# Patient Record
Sex: Female | Born: 1950 | Race: Black or African American | Hispanic: No | Marital: Married | Smoking: Never smoker
Health system: Southern US, Community
[De-identification: ages and names within clinical notes are randomized; demographics above are authoritative.]

## PROBLEM LIST (undated history)

## (undated) DIAGNOSIS — I739 Peripheral vascular disease, unspecified: Secondary | ICD-10-CM

## (undated) DIAGNOSIS — E786 Lipoprotein deficiency: Secondary | ICD-10-CM

## (undated) DIAGNOSIS — T7840XA Allergy, unspecified, initial encounter: Secondary | ICD-10-CM

## (undated) DIAGNOSIS — C801 Malignant (primary) neoplasm, unspecified: Secondary | ICD-10-CM

## (undated) DIAGNOSIS — Z789 Other specified health status: Secondary | ICD-10-CM

## (undated) DIAGNOSIS — Z923 Personal history of irradiation: Secondary | ICD-10-CM

## (undated) HISTORY — DX: Malignant (primary) neoplasm, unspecified: C80.1

## (undated) HISTORY — DX: Lipoprotein deficiency: E78.6

## (undated) HISTORY — DX: Allergy, unspecified, initial encounter: T78.40XA

---

## 1983-12-01 HISTORY — PX: FEMUR FRACTURE SURGERY: SHX633

## 1984-11-30 HISTORY — PX: OTHER SURGICAL HISTORY: SHX169

## 1998-03-28 ENCOUNTER — Other Ambulatory Visit: Admission: RE | Admit: 1998-03-28 | Discharge: 1998-03-28 | Payer: Self-pay | Admitting: Obstetrics and Gynecology

## 1999-04-01 ENCOUNTER — Other Ambulatory Visit: Admission: RE | Admit: 1999-04-01 | Discharge: 1999-04-01 | Payer: Self-pay | Admitting: Obstetrics and Gynecology

## 2000-02-11 ENCOUNTER — Emergency Department (HOSPITAL_COMMUNITY): Admission: EM | Admit: 2000-02-11 | Discharge: 2000-02-11 | Payer: Self-pay | Admitting: Emergency Medicine

## 2000-04-01 ENCOUNTER — Other Ambulatory Visit: Admission: RE | Admit: 2000-04-01 | Discharge: 2000-04-01 | Payer: Self-pay | Admitting: Obstetrics and Gynecology

## 2001-04-05 ENCOUNTER — Other Ambulatory Visit: Admission: RE | Admit: 2001-04-05 | Discharge: 2001-04-05 | Payer: Self-pay | Admitting: Obstetrics and Gynecology

## 2002-04-06 ENCOUNTER — Other Ambulatory Visit: Admission: RE | Admit: 2002-04-06 | Discharge: 2002-04-06 | Payer: Self-pay | Admitting: Obstetrics and Gynecology

## 2002-10-05 ENCOUNTER — Encounter: Payer: Self-pay | Admitting: Obstetrics and Gynecology

## 2002-10-05 ENCOUNTER — Encounter: Admission: RE | Admit: 2002-10-05 | Discharge: 2002-10-05 | Payer: Self-pay | Admitting: Obstetrics and Gynecology

## 2013-08-23 ENCOUNTER — Other Ambulatory Visit: Payer: Self-pay | Admitting: Gastroenterology

## 2013-08-25 ENCOUNTER — Encounter (HOSPITAL_COMMUNITY): Payer: Self-pay | Admitting: *Deleted

## 2013-08-28 ENCOUNTER — Encounter (HOSPITAL_COMMUNITY): Payer: Self-pay | Admitting: Pharmacy Technician

## 2013-09-19 ENCOUNTER — Ambulatory Visit (HOSPITAL_COMMUNITY): Payer: BC Managed Care – PPO | Admitting: *Deleted

## 2013-09-19 ENCOUNTER — Ambulatory Visit (HOSPITAL_COMMUNITY)
Admission: RE | Admit: 2013-09-19 | Discharge: 2013-09-19 | Disposition: A | Discharge status: Home / Self Care | Payer: BC Managed Care – PPO | Source: Ambulatory Visit | Attending: Gastroenterology | Admitting: Gastroenterology

## 2013-09-19 ENCOUNTER — Encounter (HOSPITAL_COMMUNITY)
Admission: RE | Disposition: A | Discharge status: Home / Self Care | Payer: Self-pay | Source: Ambulatory Visit | Attending: Gastroenterology

## 2013-09-19 ENCOUNTER — Encounter (HOSPITAL_COMMUNITY): Payer: BC Managed Care – PPO | Admitting: *Deleted

## 2013-09-19 ENCOUNTER — Encounter (HOSPITAL_COMMUNITY): Payer: Self-pay | Admitting: *Deleted

## 2013-09-19 DIAGNOSIS — Z860100 Personal history of colon polyps, unspecified: Secondary | ICD-10-CM | POA: Insufficient documentation

## 2013-09-19 DIAGNOSIS — C20 Malignant neoplasm of rectum: Secondary | ICD-10-CM | POA: Insufficient documentation

## 2013-09-19 DIAGNOSIS — C801 Malignant (primary) neoplasm, unspecified: Secondary | ICD-10-CM

## 2013-09-19 DIAGNOSIS — I10 Essential (primary) hypertension: Secondary | ICD-10-CM | POA: Insufficient documentation

## 2013-09-19 DIAGNOSIS — Z1211 Encounter for screening for malignant neoplasm of colon: Secondary | ICD-10-CM | POA: Insufficient documentation

## 2013-09-19 DIAGNOSIS — K5730 Diverticulosis of large intestine without perforation or abscess without bleeding: Secondary | ICD-10-CM | POA: Insufficient documentation

## 2013-09-19 HISTORY — PX: COLONOSCOPY WITH PROPOFOL: SHX5780

## 2013-09-19 HISTORY — DX: Other specified health status: Z78.9

## 2013-09-19 HISTORY — DX: Malignant (primary) neoplasm, unspecified: C80.1

## 2013-09-19 SURGERY — COLONOSCOPY WITH PROPOFOL
Anesthesia: Monitor Anesthesia Care

## 2013-09-19 MED ORDER — PROPOFOL INFUSION 10 MG/ML OPTIME
INTRAVENOUS | Status: DC | PRN
  Administered 2013-09-19: 100 ug/kg/min via INTRAVENOUS

## 2013-09-19 MED ORDER — ONDANSETRON HCL 4 MG/2ML IJ SOLN
INTRAMUSCULAR | Status: DC | PRN
  Administered 2013-09-19: 4 mg via INTRAVENOUS

## 2013-09-19 MED ORDER — LACTATED RINGERS IV SOLN
INTRAVENOUS | Status: DC
  Administered 2013-09-19 (×2): via INTRAVENOUS

## 2013-09-19 MED ORDER — PROPOFOL 10 MG/ML IV BOLUS
INTRAVENOUS | Status: DC | PRN
  Administered 2013-09-19 (×2): 30 mg via INTRAVENOUS

## 2013-09-19 MED ORDER — SODIUM CHLORIDE 0.9 % IV SOLN: INTRAVENOUS | Status: DC

## 2013-09-19 MED ORDER — KETAMINE HCL 10 MG/ML IJ SOLN
INTRAMUSCULAR | Status: DC | PRN
  Administered 2013-09-19: 25 mg via INTRAVENOUS

## 2013-09-19 MED ORDER — MIDAZOLAM HCL 5 MG/5ML IJ SOLN
INTRAMUSCULAR | Status: DC | PRN
  Administered 2013-09-19 (×2): 1 mg via INTRAVENOUS

## 2013-09-19 SURGICAL SUPPLY — 21 items

## 2013-09-19 NOTE — Anesthesia Preprocedure Evaluation (Signed)
Anesthesia Evaluation  Patient identified by MRN, date of birth, ID band Patient awake    Reviewed: Allergy & Precautions, H&P , NPO status , Patient's Chart, lab work & pertinent test results  Airway Mallampati: II TM Distance: >3 FB Neck ROM: Full    Dental no notable dental hx.    Pulmonary neg pulmonary ROS,  breath sounds clear to auscultation  Pulmonary exam normal       Cardiovascular negative cardio ROS  Rhythm:Regular Rate:Normal     Neuro/Psych negative neurological ROS  negative psych ROS   GI/Hepatic negative GI ROS, Neg liver ROS,   Endo/Other  negative endocrine ROS  Renal/GU negative Renal ROS  negative genitourinary   Musculoskeletal negative musculoskeletal ROS (+)   Abdominal   Peds negative pediatric ROS (+)  Hematology negative hematology ROS (+)   Anesthesia Other Findings   Reproductive/Obstetrics negative OB ROS                           Anesthesia Physical Anesthesia Plan  ASA: II  Anesthesia Plan: MAC   Post-op Pain Management:    Induction:   Airway Management Planned: Simple Face Mask  Additional Equipment:   Intra-op Plan:   Post-operative Plan:   Informed Consent: I have reviewed the patients History and Physical, chart, labs and discussed the procedure including the risks, benefits and alternatives for the proposed anesthesia with the patient or authorized representative who has indicated his/her understanding and acceptance.   Dental advisory given  Plan Discussed with: CRNA  Anesthesia Plan Comments:         Anesthesia Quick Evaluation

## 2013-09-19 NOTE — Preoperative (Signed)
Beta Blockers   Reason not to administer Beta Blockers:Not Applicable, not on home BB 

## 2013-09-19 NOTE — Op Note (Signed)
Procedure: Surveillance colonoscopy  Endoscopist: Danise Edge  Premedication: Propofol administered by anesthesia  Procedure: The patient was placed in the left lateral decubitus position. Anal inspection and digital rectal exam were normal. The Pentax pediatric colonoscope was introduced into the rectum and easily advanced to the cecum. A normal-appearing appendiceal orifice and ileocecal valve were identified. Colonic preparation for the exam today was good.  Rectum. At approximately 10 cm from the anal verge, there was a circumferential partially obstructing ulcerated tumor consistent with a primary rectal adenocarcinoma which was biopsied. Retroflexed view of the distal rectum was normal.  Sigmoid colon and descending colon. Left colonic diverticulosis  Splenic flexure. Normal.  Transverse colon. Normal.  Hepatic flexure. Normal.  Ascending colon. Normal.  Cecum and ileocecal valve. Normal.  Assessment:  #1. Circumferential ulcerated tumor in the rectum at 10 cm from the anal verge consistent with an adenocarcinoma  #2. Left colonic diverticulosis  Recommendations: Referred to surgery.

## 2013-09-19 NOTE — Transfer of Care (Signed)
Immediate Anesthesia Transfer of Care Note  Patient: Victoria [name]  Procedure(s) Performed: Procedure(s): COLONOSCOPY WITH PROPOFOL (N/A)  Patient Location: PACU  Anesthesia Type:MAC  Level of Consciousness: Patient easily awoken, sedated, comfortable, cooperative, following commands, responds to stimulation.   Airway & Oxygen Therapy: Patient spontaneously breathing, ventilating well, oxygen via simple oxygen mask.  Post-op Assessment: Report given to PACU RN, vital signs reviewed and stable, moving all extremities.   Post vital signs: Reviewed and stable.  Complications: No apparent anesthesia complications

## 2013-09-19 NOTE — H&P (Signed)
  Procedure: Surveillance colonoscopy  History: The patient is a 62 year old female born 13-Jul-1951. The patient has undergone a colonoscopic exam in the past with removal of adenomatous colon polyps.  The patient is scheduled to undergo a surveillance colonoscopy today.  Past medical history: Hypertension. Seasonal allergic rhinitis. Left femur fracture surgery in 1985.  Chronic medications: Nasonex. Allegra.  Allergies: Burn ointment caused rash  Exam: The patient is alert and lying comfortably on the endoscopy stretcher. Abdomen is soft and nontender to palpation. Cardiac exam reveals a regular rhythm. Lungs are clear to auscultation.  Plan: Proceed with surveillance colonoscopy.

## 2013-09-19 NOTE — Anesthesia Postprocedure Evaluation (Signed)
  Anesthesia Post-op Note  Patient: Victoria [name]  Procedure(s) Performed: Procedure(s) (LRB): COLONOSCOPY WITH PROPOFOL (N/A)  Patient Location: PACU  Anesthesia Type: MAC  Level of Consciousness: awake and alert   Airway and Oxygen Therapy: Patient Spontanous Breathing  Post-op Pain: mild  Post-op Assessment: Post-op Vital signs reviewed, Patient's Cardiovascular Status Stable, Respiratory Function Stable, Patent Airway and No signs of Nausea or vomiting  Last Vitals:  Filed Vitals:   09/19/13 0910  BP: 147/84  Temp:   Resp: 22    Post-op Vital Signs: stable   Complications: No apparent anesthesia complications

## 2013-09-20 ENCOUNTER — Encounter (HOSPITAL_COMMUNITY): Payer: Self-pay | Admitting: Gastroenterology

## 2013-09-22 ENCOUNTER — Encounter (INDEPENDENT_AMBULATORY_CARE_PROVIDER_SITE_OTHER): Payer: Self-pay

## 2013-09-26 ENCOUNTER — Other Ambulatory Visit: Payer: Self-pay | Admitting: Gastroenterology

## 2013-09-27 ENCOUNTER — Encounter (HOSPITAL_COMMUNITY)
Admission: RE | Disposition: A | Discharge status: Home / Self Care | Payer: Self-pay | Source: Ambulatory Visit | Attending: Gastroenterology

## 2013-09-27 ENCOUNTER — Ambulatory Visit (HOSPITAL_COMMUNITY)
Admission: RE | Admit: 2013-09-27 | Discharge: 2013-09-27 | Disposition: A | Discharge status: Home / Self Care | Payer: BC Managed Care – PPO | Source: Ambulatory Visit | Attending: Gastroenterology | Admitting: Gastroenterology

## 2013-09-27 ENCOUNTER — Encounter (INDEPENDENT_AMBULATORY_CARE_PROVIDER_SITE_OTHER): Payer: Self-pay | Admitting: General Surgery

## 2013-09-27 ENCOUNTER — Other Ambulatory Visit (INDEPENDENT_AMBULATORY_CARE_PROVIDER_SITE_OTHER): Payer: Self-pay | Admitting: General Surgery

## 2013-09-27 ENCOUNTER — Ambulatory Visit (INDEPENDENT_AMBULATORY_CARE_PROVIDER_SITE_OTHER): Payer: BC Managed Care – PPO | Admitting: General Surgery

## 2013-09-27 ENCOUNTER — Telehealth (INDEPENDENT_AMBULATORY_CARE_PROVIDER_SITE_OTHER): Payer: Self-pay | Admitting: *Deleted

## 2013-09-27 ENCOUNTER — Ambulatory Visit (INDEPENDENT_AMBULATORY_CARE_PROVIDER_SITE_OTHER): Payer: Self-pay | Admitting: General Surgery

## 2013-09-27 ENCOUNTER — Encounter (HOSPITAL_COMMUNITY): Payer: Self-pay | Admitting: *Deleted

## 2013-09-27 VITALS — BP 128/82 | HR 72 | Temp 97.5°F | Resp 16 | Ht 65.0 in | Wt 174.0 lb

## 2013-09-27 DIAGNOSIS — Z860100 Personal history of colon polyps, unspecified: Secondary | ICD-10-CM | POA: Insufficient documentation

## 2013-09-27 DIAGNOSIS — C20 Malignant neoplasm of rectum: Secondary | ICD-10-CM

## 2013-09-27 DIAGNOSIS — I10 Essential (primary) hypertension: Secondary | ICD-10-CM | POA: Insufficient documentation

## 2013-09-27 HISTORY — PX: EUS: SHX5427

## 2013-09-27 SURGERY — ULTRASOUND, LOWER GI TRACT, ENDOSCOPIC
Anesthesia: Moderate Sedation

## 2013-09-27 MED ORDER — MIDAZOLAM HCL 10 MG/2ML IJ SOLN
INTRAMUSCULAR | Status: DC | PRN
  Administered 2013-09-27: 1 mg via INTRAVENOUS
  Administered 2013-09-27 (×2): 2 mg via INTRAVENOUS

## 2013-09-27 MED ORDER — MIDAZOLAM HCL 10 MG/2ML IJ SOLN
INTRAMUSCULAR | Status: AC
  Filled 2013-09-27: qty 2

## 2013-09-27 MED ORDER — FENTANYL CITRATE 0.05 MG/ML IJ SOLN
INTRAMUSCULAR | Status: AC
  Filled 2013-09-27: qty 2

## 2013-09-27 MED ORDER — DIPHENHYDRAMINE HCL 50 MG/ML IJ SOLN
INTRAMUSCULAR | Status: AC
  Filled 2013-09-27: qty 1

## 2013-09-27 MED ORDER — FENTANYL CITRATE 0.05 MG/ML IJ SOLN
INTRAMUSCULAR | Status: DC | PRN
  Administered 2013-09-27 (×2): 25 ug via INTRAVENOUS

## 2013-09-27 NOTE — Patient Instructions (Signed)
Continue your usual diet and activities.

## 2013-09-27 NOTE — Progress Notes (Addendum)
Patient ID: Victoria [name], female   DOB: 1951-06-10, 62 y.o.   MRN: [id number]  No chief complaint on file.   HPI Victoria [name] is a 62 y.o. female.   HPI  She is referred by Dr. Danise Edge for newly diagnosed rectal cancer. She underwent a routine colonoscopy and was noted to have a mass approximately 10 cm from the anal verge. Biopsy was positive for adenocarcinoma. In questioning her, her appetite has decreased over the past 3 months and she is lost approximately 30 pounds. She has noticed decreased caliber of her stool. She has no anal or rectal pain. No rectal bleeding. She has no abdominal pain. Her energy level is normal. She underwent endorectal ultrasound by Dr. Dulce Sellar today. This demonstrated a T3 N1 lesion by ultrasound criteria.  No family history of colon cancer.  Last colonoscopy was 4-5 years ago at which time some polyps were removed.  Past Medical History  Diagnosis Date  . Medical history non-contributory   . HDL deficiency   . Allergy     seasonal allergic rhinitis    Past Surgical History  Procedure Laterality Date  . Left leg surgery  1986    rod from hip to knee  . Colonoscopy with propofol N/A 09/19/2013    Procedure: COLONOSCOPY WITH PROPOFOL;  Surgeon: Charolett Bumpers, MD;  Location: [facility];  Service: Endoscopy;  Laterality: N/A;  . Femur fracture surgery Left 1985    steel rod    Family History  Problem Relation Age of Onset  . Hypertension Mother   . Diabetes Father   . Heart disease Father     CAD    Social History History  Substance Use Topics  . Smoking status: Never Smoker   . Smokeless tobacco: Never Used  . Alcohol Use: Yes     Comment: occasional    Allergies  Allergen Reactions  . Ointment Base [Lanolin-Petrolatum] Rash    Specifically burn ointments.    No current outpatient prescriptions on file.   No current facility-administered medications for this visit.    Review of Systems Review of Systems   Constitutional: Unexpected weight change: 30 pound weight loss.  HENT: Negative.   Respiratory: Negative.   Cardiovascular: Negative.   Gastrointestinal: Positive for constipation (Occasional). Negative for nausea, abdominal pain and rectal pain.  Genitourinary: Negative.   Neurological: Negative.   Hematological: Negative.     Blood pressure 128/82, pulse 72, temperature 97.5 F (36.4 C), temperature source Temporal, resp. rate 16, height 5\' 5"  (1.651 m), weight 174 lb (78.926 kg).  Physical Exam Physical Exam  Constitutional: No distress.  Overweight female  HENT:  Head: Normocephalic and atraumatic.  Eyes: EOM are normal. No scleral icterus.  Neck: Neck supple.  Cardiovascular: Normal rate and regular rhythm.   Pulmonary/Chest: Effort normal and breath sounds normal.  No supraclavicular adenopathy.  Abdominal: Soft. She exhibits no distension and no mass. There is no tenderness.  Genitourinary:  On digital rectal exam, there is a palpable mass 7.5 cm from the anal verge.  There is a prominent left inguinal lymph node.  Musculoskeletal: She exhibits no edema.  Lymphadenopathy:    She has no cervical adenopathy.  Neurological: She is alert.  Skin: Skin is warm and dry.  Psychiatric: She has a normal mood and affect. Her behavior is normal.    Data Reviewed Colonoscopy report. Endorectal ultrasound pictures report. Pathology report. Note from Dr. Laural Benes.  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 Ultrasound T3, N1 lesion.   She has a recent 30 pound weight loss. She is otherwise asymptomatic from this.     Plan    Staging CT of chest, abdomen and pelvis. CEA level. Referral to medical oncology and radiation oncology for neoadjuvant chemotherapy radiation therapy.   I explained to her and her husband operative management this which could be low anterior resection and protective loop ileostomy following the treatments. We discussed  getting the staging CTs as soon as possible as well as a CEA level. We'll discuss the results with her when I receive them.       Dell Hurtubise J 09/27/2013, 1:06 PM

## 2013-09-27 NOTE — Addendum Note (Signed)
Addended by: Willis Modena on: 09/27/2013 07:58 AM   Modules accepted: Orders

## 2013-09-27 NOTE — H&P (View-Only) (Signed)
  Procedure: Surveillance colonoscopy  History: The patient is a 62-year-old female born 06/15/1951. The patient has undergone a colonoscopic exam in the past with removal of adenomatous colon polyps.  The patient is scheduled to undergo a surveillance colonoscopy today.  Past medical history: Hypertension. Seasonal allergic rhinitis. Left femur fracture surgery in 1985.  Chronic medications: Nasonex. Allegra.  Allergies: Burn ointment caused rash  Exam: The patient is alert and lying comfortably on the endoscopy stretcher. Abdomen is soft and nontender to palpation. Cardiac exam reveals a regular rhythm. Lungs are clear to auscultation.  Plan: Proceed with surveillance colonoscopy. 

## 2013-09-27 NOTE — Progress Notes (Signed)
Dr. Dulce Sellar in to see pt post procedure.  Teaching done by this RN with patient and spouse ie development of POC for treatment of Ca mass. Discussed use of chemo/radiation and surg. intervention tailored specifically for her type of and stage of Ca.  Both asking appropriate questions. Pt to see Surgeon today.Marland Kitchen

## 2013-09-27 NOTE — Interval H&P Note (Signed)
History and Physical Interval Note:  09/27/2013 7:58 AM  Victoria [name]  has presented today for surgery, with the diagnosis of rectal ca  The various methods of treatment have been discussed with the patient and family. After consideration of risks, benefits and other options for treatment, the patient has consented to  Procedure(s): LOWER ENDOSCOPIC ULTRASOUND (EUS) (N/A) as a surgical intervention .  The patient's history has been reviewed, patient examined, no change in status, stable for surgery.  I have reviewed the patient's chart and labs.  Questions were answered to the patient's satisfaction.     Victoria [name] M  Assessment:  1.  Rectal Cancer.  Biopsy-proven adenocarcinoma.  Plan:  1.  Rectal ultrasound for locoregional staging.  Patient has surgical appointment later today and will need complete staging with CT or PET scan as well. 2.  Risks (bleeding, infection, bowel perforation that could require surgery, sedation-related changes in cardiopulmonary systems), benefits (identification and possible treatment of source of symptoms, exclusion of certain causes of symptoms), and alternatives (watchful waiting, radiographic imaging studies, empiric medical treatment) of endorectal ultrasound (RUS) were explained to patient/family in detail and patient wishes to proceed.

## 2013-09-27 NOTE — Op Note (Addendum)
[facility] [address] [place], [address]   OPERATIVE PROCEDURE REPORT  PATIENT: Victoria [name], Victoria [name].  MR#: [id number] BIRTHDATE: February 21, 1951  GENDER: Female ENDOSCOPIST: Willis Modena, MD REFERRED BY:  Danise Edge, M.D.; Kirby Funk, MD; Avel Peace, MD PROCEDURE DATE:  09/27/2013 PROCEDURE:   Flexible sigmoidoscopy EUS ASA CLASS:   Class II INDICATIONS:1.  rectal cancer (adenocarcinoma). MEDICATIONS:      Fentanyl 50 mcg IV; Versed 5 mg IV  DESCRIPTION OF PROCEDURE:   After the risks benefits and alternatives of the procedure were thoroughly explained, informed consent was obtained.  Throughout the procedure, the patients blood pressure, pulse and oxygen saturations were monitored continuously. Under direct visualization, the forward-viewing radial  echoendoscope was introduced through the anus  and advanced to the sigmoid colon .  Water was used as necessary to provide an acoustic interface.  Imaging was obtained at 7.5 and . Upon completion of the imaging, water was removed and the patient was sent to the recovery room in satisfactory condition.    FINDINGS:   Firm, fixed mass palpated along left posterolateral rectal wall, at end of examination digit.  Endoscopically, mass is near completely ( 75%, and confirmed by ultrasound) circumferential, but sparing more of the right anterolateral rectal wall.  Mass is located approximately 8cm to 13cm from the anal verge.  Mass is ulcerated and friable.  Water was subsequently instilled into the rectum to facilitate acoustic coupling for ultrasonographic analysis.  Mass is about 75% circumferential. Multiple regions of the tumor penetrate through the muscularis propria.  A few neighboring malignant-appearing perirectal lymph nodes were seen.  STAGING: T3 N1 Mx  ENDOSCOPIC IMPRESSION: As above.  RECOMMENDATIONS: 1.  Watch for potential complications of procedure. 2.  Surgical  appointment today. 3.  Needs CEA and staging imaging (CT versus PET scan); will defer to surgical consultant.   _______________________________ Rosalie DoctorWillis Modena, MD 09/27/2013 8:31 AM Revised: 09/27/2013 8:31 AM  CC:

## 2013-09-27 NOTE — Telephone Encounter (Signed)
I called pt to inform her of her appt for the CT scan at [facility] on 09/29/13 with an arrival time of 1:45pm.  I instructed pt on when to drink her contrast and to have no solid foods after 10am.  Pt agreeable.

## 2013-09-28 ENCOUNTER — Encounter (HOSPITAL_COMMUNITY): Payer: Self-pay | Admitting: Gastroenterology

## 2013-09-29 ENCOUNTER — Encounter: Payer: Self-pay | Admitting: *Deleted

## 2013-09-29 ENCOUNTER — Ambulatory Visit
Admit: 2013-09-29 | Discharge: 2013-09-29 | Disposition: A | Discharge status: Home / Self Care | Payer: BC Managed Care – PPO | Attending: General Surgery | Admitting: General Surgery

## 2013-09-29 DIAGNOSIS — C20 Malignant neoplasm of rectum: Secondary | ICD-10-CM

## 2013-09-29 MED ORDER — IOHEXOL 300 MG/ML  SOLN
100.0000 mL | Freq: Once | INTRAMUSCULAR | Status: AC | PRN
  Administered 2013-09-29: 100 mL via INTRAVENOUS

## 2013-09-29 NOTE — Progress Notes (Signed)
GU Location of Tumor / Histology: Rectum    Patient presented  months ago with signs/symptoms of: decreased caliber of stool, loss appetite  Past 3 months,loss 30 lbs,no rectal bleeding,  Biopsies of Rectum 09/19/13:DiagnosisRectum, biopsy, adenocarcinoma- ADENOCARCINOMA.  Past/Anticipated interventions by urology, if any: Colonoscopy 09/19/13 Dr. Robin Searing, last colonoscopy 4-5 years ago, no family hx colon cancer,polyps removed then  Past/Anticipated interventions by medical oncology, if ZOX:WRUE 10/09/13 2 pm with Dr.Sherrill,  Weight changes, if any: 30 lb wt.loss past 3 months  Bowel/Bladder complaints, if any: decreased caliber stool Nausea/Vomiting, if any: no  Pain issues, if any no :  SAFETY ISSUES:no  Prior radiation?no  Pacemaker/ICD? no  Possible current pregnancy? no  Is the patient on methotrexate? no  Current Complaints / other details:  Dr.Todd Rosenbower seen patient 10/29/14ordered  For 09/29/13 ct chest /abd/pelvis  ,CEA level referral to med/onc and radiation oncology surgery following treatments,

## 2013-10-02 ENCOUNTER — Encounter: Payer: Self-pay | Admitting: Radiation Oncology

## 2013-10-02 ENCOUNTER — Ambulatory Visit
Admit: 2013-10-02 | Discharge: 2013-10-02 | Disposition: A | Discharge status: Home / Self Care | Payer: BC Managed Care – PPO | Attending: Radiation Oncology | Admitting: Radiation Oncology

## 2013-10-02 VITALS — BP 153/68 | HR 67 | Temp 98.1°F | Resp 20 | Ht 65.0 in | Wt 174.2 lb

## 2013-10-02 DIAGNOSIS — C20 Malignant neoplasm of rectum: Secondary | ICD-10-CM

## 2013-10-02 NOTE — Progress Notes (Signed)
    GU Location of Tumor / Histology: Rectum  Patient presented months ago with signs/symptoms of: decreased caliber of stool, loss appetite Past 3 months,loss 30 lbs,no rectal bleeding,  Biopsies of Rectum 09/19/13:DiagnosisRectum, biopsy, adenocarcinoma- ADENOCARCINOMA.  Past/Anticipated interventions by urology, if any: Colonoscopy 09/19/13 Dr. Robin Searing, last colonoscopy 4-5 years ago, no family hx colon cancer,polyps removed then  Past/Anticipated interventions by medical oncology, if YQM:VHQI 10/09/13 2 pm with Dr.Sherrill,  Weight changes, if any: 30 lb wt.loss past 3 months  Bowel/Bladder complaints, if any: decreased caliber stool  Nausea/Vomiting, if any: no  Pain issues, if any no but c/o constipation, takes metamucil prn  :  SAFETY ISSUES:no  Prior radiation?no  Pacemaker/ICD? no  Possible current pregnancy? no  Is the patient on methotrexate? no Current Complaints / other details: Married, no children  Dr.Todd Rosenbower seen patient 10/29/14ordered For 09/29/13 ct chest /abd/pelvis ,CEA level referral to med/onc and radiation oncology surgery following treatments, Has a steel rod in left leg 20 years, menopause,

## 2013-10-02 NOTE — Progress Notes (Signed)
Please see the Nurse Progress Note in the MD Initial Consult Encounter for this patient. 

## 2013-10-03 ENCOUNTER — Telehealth: Payer: Self-pay | Admitting: *Deleted

## 2013-10-03 NOTE — Telephone Encounter (Signed)
Confirmed appointment with Dr. Truett Perna on 10/09/13.

## 2013-10-04 ENCOUNTER — Ambulatory Visit
Admission: RE | Admit: 2013-10-04 | Discharge: 2013-10-04 | Disposition: A | Discharge status: Home / Self Care | Payer: BC Managed Care – PPO | Source: Ambulatory Visit | Attending: Radiation Oncology | Admitting: Radiation Oncology

## 2013-10-04 ENCOUNTER — Ambulatory Visit (INDEPENDENT_AMBULATORY_CARE_PROVIDER_SITE_OTHER): Payer: Self-pay | Admitting: General Surgery

## 2013-10-04 ENCOUNTER — Telehealth (INDEPENDENT_AMBULATORY_CARE_PROVIDER_SITE_OTHER): Payer: Self-pay

## 2013-10-04 DIAGNOSIS — R110 Nausea: Secondary | ICD-10-CM | POA: Insufficient documentation

## 2013-10-04 DIAGNOSIS — K6289 Other specified diseases of anus and rectum: Secondary | ICD-10-CM | POA: Insufficient documentation

## 2013-10-04 DIAGNOSIS — Z510 Encounter for antineoplastic radiation therapy: Secondary | ICD-10-CM | POA: Insufficient documentation

## 2013-10-04 DIAGNOSIS — R5381 Other malaise: Secondary | ICD-10-CM | POA: Insufficient documentation

## 2013-10-04 DIAGNOSIS — C20 Malignant neoplasm of rectum: Secondary | ICD-10-CM | POA: Insufficient documentation

## 2013-10-04 DIAGNOSIS — R630 Anorexia: Secondary | ICD-10-CM | POA: Insufficient documentation

## 2013-10-04 DIAGNOSIS — Z79899 Other long term (current) drug therapy: Secondary | ICD-10-CM | POA: Insufficient documentation

## 2013-10-04 NOTE — Telephone Encounter (Signed)
Patient calling into office stating she had a miss call from our office.  I did not see any telephone notes in EPIC for patient.  Patient ask for [place] to call on her mobile number if we need to speak with her [phone]).  Patient aware Dr. Abbey Chatters has left for the day and I will forward message to his assistant.

## 2013-10-04 NOTE — Progress Notes (Signed)
[facility] Psychosocial Distress Screening Clinical Social Work  Clinical Social Work was referred by distress screening protocol.  The patient scored a 5 on the Psychosocial Distress Thermometer which indicates moderate distress. Clinical Social Worker Intern telephoned to assess for distress and other psychosocial needs. Patient was not home and person that answered the phone took a message.     Clinical Social Worker follow up needed: no  If yes, follow up plan:   Victoria [name] S. [facility] Clinical Social Work Intern [employer] [facility] [phone]

## 2013-10-05 ENCOUNTER — Encounter (INDEPENDENT_AMBULATORY_CARE_PROVIDER_SITE_OTHER): Payer: Self-pay | Admitting: General Surgery

## 2013-10-05 NOTE — Progress Notes (Signed)
Patient ID: Victoria [name], female   DOB: [birth year], 62 y.o.   MRN: [id number] I spoke with her regarding the results of her CT scans, which are below, as well as a normal CEA. She has an indeterminate 7 mm nodule in the right upper lobe of her lung. I told her Dr. Truett Perna would review that and decide if anything further needs to be done.  IMPRESSION:  1. Circumferential rectal thickening over 5 cm segment consists with  primary carcinoma.  2. Small 5 mm presacral lymph node is concerning for local  metastasis.  3. No evidence of or distant iliac nodal metastasis or  retroperitoneal metastasis  4. Single right upper lobe pulmonary nodule measuring 7 mm is  indeterminate. Recommend either attention on followup versus further  evaluation with FDG PET/CT scan.

## 2013-10-05 NOTE — Progress Notes (Signed)
Radiation Oncology         [phone] ________________________________  Name: Victoria [name] MRN: [id number]  Date: 10/02/2013  DOB: 1951-01-04  WU:[id number],NWGN Jomarie Longs, MD  Abbey Chatters, Jim Desanctis, MD     G. Rolm Baptise, M.D.  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 female who is seen for an initial consultation visit. The patient underwent a colonoscopy as part of recent medical workup. The patient's husband indicates that she was having some weight loss and he discussed proceeding with a colonoscopy among other avenues with her primary care physician. This colonoscopy revealed a rectal mass and a biopsy was performed which returned positive for adenocarcinoma.  The patient has undergone a flexible sigmoidoscopy with endoscopic ultrasound. A firm fixed mass was present along the left posterior lateral rectal wall at the end of the examination digit. Endoscopically this appeared to represent a T3 N1 tumor. The mass was located from approximately 8 cm to 13 cm from the anal her.  CT imaging has been performed. A CT scan of the chest revealed a 7 mm nodule within the right lung apex without any additional pulmonary nodules. Circumferential thickening of the rectum was present beginning at approximately 3 cm from the anal verge and extending 45 cm. A small suspicious 5 mm presacral lymph node was also present.  The patient indicates that she has been doing reasonably well. She has no anal or rectal pain at this time. She did have just a little bit of rectal bleeding for approximately 3-4 months with stool. She estimates that she has lost approximately 30 pounds.   PREVIOUS RADIATION THERAPY: No   PAST MEDICAL HISTORY:  has a past medical history of Medical history non-contributory; HDL deficiency; Allergy; and Cancer (09/19/13).     PAST SURGICAL  HISTORY: Past Surgical History  Procedure Laterality Date  . Left leg surgery  1986    rod from hip to knee  . Colonoscopy with propofol N/A 09/19/2013    Procedure: COLONOSCOPY WITH PROPOFOL;  Surgeon: Charolett Bumpers, MD;  Location: [facility];  Service: Endoscopy;  Laterality: N/A;  . Femur fracture surgery Left 1985    steel rod  . Eus N/A 09/27/2013    Procedure: LOWER ENDOSCOPIC ULTRASOUND (EUS);  Surgeon: Willis Modena, MD;  Location: [facility];  Service: Endoscopy;  Laterality: N/A;     FAMILY HISTORY: family history includes Diabetes in her father; Heart disease in her father; Hypertension in her mother.   SOCIAL HISTORY:  reports that she has never smoked. She has never used smokeless tobacco. She reports that she drinks alcohol. She reports that she does not use illicit drugs.   ALLERGIES: Ointment base   MEDICATIONS:  Current Outpatient Prescriptions  Medication Sig Dispense Refill  . ASA-APAP-Caff Buffered [phone] MG TABS Take 1 tablet by mouth as needed.      . fexofenadine (ALLEGRA) 180 MG tablet Take 180 mg by mouth daily.      . mometasone (NASONEX) 50 MCG/ACT nasal spray Place 2 sprays into the nose daily.      . psyllium (METAMUCIL) 58.6 % powder Take 1 packet by mouth every other day.       No current facility-administered medications for this encounter.     REVIEW OF SYSTEMS:  A 15 point review of systems is documented in the electronic medical record. This was obtained by the  nursing staff. However, I reviewed this with the patient to discuss relevant findings and make appropriate changes.  Pertinent items are noted in HPI.    PHYSICAL EXAM:  height is 5\' 5"  (1.651 m) and weight is 174 lb 3.2 oz (79.017 kg). Her oral temperature is 98.1 F (36.7 C). Her blood pressure is 153/68 and her pulse is 67. Her respiration is 20.   General: Well-developed, in no acute distress HEENT: Normocephalic, atraumatic; oral cavity clear Neck: Supple without any  lymphadenopathy Cardiovascular: Regular rate and rhythm Respiratory: Clear to auscultation bilaterally GI: Soft, nontender, normal bowel sounds Extremities: No edema present Neuro: No focal deficits Rectal:  Rectal tumor felt at the end of examination digit. No blood on exam glove. No external suspicious findings. Tumor estimated to begin approximately 5-6 cm from the anal verge.    LABORATORY DATA:  No results found for this basename: WBC, HGB, HCT, MCV, PLT   No results found for this basename: NA, K, CL, CO2   No results found for this basename: ALT, AST, GGT, ALKPHOS, BILITOT      RADIOGRAPHY: Ct Chest W Contrast  09/29/2013   CLINICAL DATA:  Rectal carcinoma new diagnosis. Weight loss, positive colonoscopy 09/19/2013  EXAM: CT CHEST, ABDOMEN, AND PELVIS WITH CONTRAST  TECHNIQUE: Multidetector CT imaging of the chest, abdomen and pelvis was performed following the standard protocol during bolus administration of intravenous contrast.  CONTRAST:  OMNIPAQUE IOHEXOL 300 MG/ML  SOLN  BUN and creatinine were obtained on site at [facility] at  [address]  Results:  BUN 5.0 mg/dL,  Creatinine 1.0 mg/dL.  COMPARISON:  None.  FINDINGS: CT CHEST FINDINGS  No axillary or supraclavicular lymphadenopathy. Thyroid gland is normal. No mediastinal hilar lower lymphadenopathy. No pericardial fluid. No central pulmonary embolism.  Review of the lung parenchyma demonstrates a 7 mm nodule at the right lung apex (image 18). No additional pulmonary nodules.  CT ABDOMEN AND PELVIS FINDINGS  No focal hepatic lesion. Diffuse low-attenuation liver suggests hepatic steatosis. There is ill-defined high-density material within the gallbladder which likely represents a combination of sludge and gallstones. The common bile duct is upper limits of normal 6 mm. Pancreas appears normal. The spleen, adrenal glands, and kidneys are normal.  The stomach, small bowel, appendix, cecum are normal. Moderate  volume stool in the ascending and transverse colon. The transverse colon dips low in the pelvis. Descending colon is collapsed. There is circumferential thickening of the rectum over a long segment measuring approximately 5 cm beginning approximately 3 cm from the anal verge. There is a small 5 mm a presacral lymph node (image 99) just superior to the rectal thickening. No additional pelvic lymphadenopathy. No retroperitoneal or retrocrural adenopathy  The uterus and bladder normal. No aggressive osseous lesion. Internal fixation of the right femur.  IMPRESSION: 1. Circumferential rectal thickening over 5 cm segment consists with primary carcinoma.  2. Small 5 mm presacral lymph node is concerning for local metastasis.  3. No evidence of or distant iliac nodal metastasis or retroperitoneal metastasis  4. Single right upper lobe pulmonary nodule measuring 7 mm is indeterminate. Recommend either attention on followup versus further evaluation with FDG PET/CT scan.   Electronically Signed   By: Genevive Bi M.D.   On: 09/29/2013 15:39   Ct Abdomen Pelvis W Contrast  09/29/2013   CLINICAL DATA:  Rectal carcinoma new diagnosis. Weight loss, positive colonoscopy 09/19/2013  EXAM: CT CHEST, ABDOMEN, AND PELVIS WITH CONTRAST  TECHNIQUE:  Multidetector CT imaging of the chest, abdomen and pelvis was performed following the standard protocol during bolus administration of intravenous contrast.  CONTRAST:  OMNIPAQUE IOHEXOL 300 MG/ML  SOLN  BUN and creatinine were obtained on site at [facility] at  [address]  Results:  BUN 5.0 mg/dL,  Creatinine 1.0 mg/dL.  COMPARISON:  None.  FINDINGS: CT CHEST FINDINGS  No axillary or supraclavicular lymphadenopathy. Thyroid gland is normal. No mediastinal hilar lower lymphadenopathy. No pericardial fluid. No central pulmonary embolism.  Review of the lung parenchyma demonstrates a 7 mm nodule at the right lung apex (image 18). No additional pulmonary nodules.   CT ABDOMEN AND PELVIS FINDINGS  No focal hepatic lesion. Diffuse low-attenuation liver suggests hepatic steatosis. There is ill-defined high-density material within the gallbladder which likely represents a combination of sludge and gallstones. The common bile duct is upper limits of normal 6 mm. Pancreas appears normal. The spleen, adrenal glands, and kidneys are normal.  The stomach, small bowel, appendix, cecum are normal. Moderate volume stool in the ascending and transverse colon. The transverse colon dips low in the pelvis. Descending colon is collapsed. There is circumferential thickening of the rectum over a long segment measuring approximately 5 cm beginning approximately 3 cm from the anal verge. There is a small 5 mm a presacral lymph node (image 99) just superior to the rectal thickening. No additional pelvic lymphadenopathy. No retroperitoneal or retrocrural adenopathy  The uterus and bladder normal. No aggressive osseous lesion. Internal fixation of the right femur.  IMPRESSION: 1. Circumferential rectal thickening over 5 cm segment consists with primary carcinoma.  2. Small 5 mm presacral lymph node is concerning for local metastasis.  3. No evidence of or distant iliac nodal metastasis or retroperitoneal metastasis  4. Single right upper lobe pulmonary nodule measuring 7 mm is indeterminate. Recommend either attention on followup versus further evaluation with FDG PET/CT scan.   Electronically Signed   By: Genevive Bi M.D.   On: 09/29/2013 15:39       IMPRESSION: The patient has a locally advanced adenocarcinoma of the rectum beginning in the mid to lower portion of the rectum. This represents aT3N1M0 adenocarcinoma of the rectum.  The patient I believe is an appropriate candidate to proceed with neoadjuvant chemoradiotherapy. This would improve local/regional control and hopefully decrease the size of the tumor and the extent of the tumor prior to surgical resection. I discussed with the  patient the rationale of radiation treatment in this setting. We discussed the side effects and risks of a possible course of treatment as well. I would recommend 5-1/2 weeks of radiation with concurrent chemotherapy. She is scheduled to see Dr. Truett Perna next week.  All the patient's questions were answered. She does wish to proceed with this treatment plan.    PLAN: The patient will proceed with a simulation such that we can begin treatment planning. The patient will tentatively be planned to begin a 5-1/2 week course of radiation treatment on 10/16/2013.    I spent 60 minutes face to face with the patient and more than 50% of that time was spent in counseling and/or coordination of care.    ________________________________   Radene Gunning, MD, PhD

## 2013-10-05 NOTE — Addendum Note (Signed)
Encounter addended by: Jonna Coup, MD on: 10/05/2013 10:41 PM<BR>     Documentation filed: Visit Diagnoses

## 2013-10-06 NOTE — Addendum Note (Signed)
Encounter addended by: Delynn Flavin, RN on: 10/06/2013  6:00 PM<BR>     Documentation filed: Charges VN

## 2013-10-09 ENCOUNTER — Ambulatory Visit: Payer: BC Managed Care – PPO

## 2013-10-09 ENCOUNTER — Telehealth: Payer: Self-pay | Admitting: Oncology

## 2013-10-09 ENCOUNTER — Other Ambulatory Visit: Payer: Self-pay | Admitting: *Deleted

## 2013-10-09 ENCOUNTER — Ambulatory Visit (HOSPITAL_BASED_OUTPATIENT_CLINIC_OR_DEPARTMENT_OTHER): Payer: BC Managed Care – PPO | Admitting: Oncology

## 2013-10-09 ENCOUNTER — Encounter: Payer: Self-pay | Admitting: Oncology

## 2013-10-09 VITALS — BP 152/92 | HR 73 | Temp 98.7°F | Resp 20 | Ht 65.0 in | Wt 172.4 lb

## 2013-10-09 DIAGNOSIS — R634 Abnormal weight loss: Secondary | ICD-10-CM

## 2013-10-09 DIAGNOSIS — C20 Malignant neoplasm of rectum: Secondary | ICD-10-CM

## 2013-10-09 DIAGNOSIS — R911 Solitary pulmonary nodule: Secondary | ICD-10-CM

## 2013-10-09 MED ORDER — CAPECITABINE 500 MG PO TABS: 1500.0000 mg | ORAL_TABLET | Freq: Two times a day (BID) | ORAL | Status: DC

## 2013-10-09 NOTE — Progress Notes (Signed)
[facility] New Patient Consult   Referring MD: Avel Peace   Victoria [name] 62 y.o.  May 20, 1951    Reason for Referral: Rectal cancer     HPI: She was referred to Dr. Laural Benes for a screening colonoscopy on 09/19/2013. The anal inspection and digital rectal exam are normal. At approximately 10 cm from the anal verge a mass was noted. The mass was biopsied. There was left colon diverticulosis. A biopsy ([id number]) confirmed adenocarcinoma.  She was referred for CTs of the chest, abdomen, and pelvis on 09/29/2013. The CT the chest revealed a 7 mm nodule at the right lung apex. No other nodules. No focal hepatic lesion. Circumferential thickening of the rectum over a 5 cm segment beginning approximately 3 cm from the anal verge. A 5 mm presacral lymph node was noted  superior to the rectal thickening. No additional pelvic lymphadenopathy.  She was referred to Dr. Dulce Sellar and underwent an endoscopic ultrasound on 09/27/2013. A mass was noted at the left posterior lateral rectal wall from 8 cm to 13 cm from the anal verge. The mass was ulcerated and friable. Multiple regions of the tumor were noted to penetrate through the muscularis propria. A few malignant appearing perirectal nodes were seen. The tumor was staged as a uT3N1 lesion.  She was referred to Dr. Abbey Chatters. A mass was palpated 7.5 cm from the anal verge. There was a prominent left inguinal lymph node.    Past Medical History  Diagnosis Date  .  G0 P0    .  low HDL    . Allergy     seasonal allergic rhinitis  . Cancer  uT3uN1 09/19/13    rectum    Past Surgical History  Procedure Laterality Date  . Left leg surgery  1986    rod from hip to knee  . Colonoscopy with propofol N/A 09/19/2013    Procedure: COLONOSCOPY WITH PROPOFOL;  Surgeon: Charolett Bumpers, MD;  Location: [facility];  Service: Endoscopy;  Laterality: N/A;  . Femur fracture surgery Left 1985    steel rod  . Eus N/A  09/27/2013    Procedure: LOWER ENDOSCOPIC ULTRASOUND (EUS);  Surgeon: Willis Modena, MD;  Location: [facility];  Service: Endoscopy;  Laterality: N/A;    Family History  Problem Relation Age of Onset  . Hypertension Mother   . Diabetes Father   . Heart disease Father     CAD   No family history of cancer.  Current outpatient prescriptions:ASA-APAP-Caff Buffered [phone] MG TABS, Take 1 tablet by mouth as needed., Disp: , Rfl: ;  psyllium (METAMUCIL) 58.6 % powder, Take by mouth daily. 1 tablespoon daily, Disp: , Rfl: ;  capecitabine (XELODA) 500 MG tablet, Take 3 tablets (1,500 mg total) by mouth 2 (two) times daily after a meal. Take on days of radiation only (M-F) Total daily dose = 3000mg , Disp: 120 tablet, Rfl: 0  Allergies:  Allergies  Allergen Reactions  . Ointment Base [Lanolin-Petrolatum] Rash    Specifically burn ointments.    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 She does not use tobacco or alcohol. No transfusion history. No risk factor for HIV or hepatitis.   ROS:   Positives include: 38 pound weight loss, anorexia, intermittent rectal bleeding, mild constipation, "pressure "feeling at the rectum and pelvis  A complete ROS was otherwise negative.  Physical Exam:  Blood pressure 152/92, pulse 73, temperature 98.7 F (37.1 C), temperature source Oral, resp. rate 20,  height 5\' 5"  (1.651 m), weight 172 lb 6.4 oz (78.2 kg), SpO2 100.00%.  HEENT: Oropharynx without visible mass, neck without mass Lungs: Clear bilaterally Cardiac: Regular rate and rhythm Abdomen: No hepatosplenomegaly, nontender, no mass  Vascular: No leg edema Lymph nodes: No cervical, supraclavicular, axillary, or inguinal nodes Neurologic: Alert and oriented, the motor exam appears intact in the upper and lower extremities Skin: No rash Musculoskeletal: No spine tenderness   LAB:  CBC  CEA on 10/02/2013 - 3.8  Radiology:  As per history of present  illness  Assessment/Plan:   1. 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 Indeterminate right lung nodule on a CT 09/29/2013  3. Weight loss   Disposition:   Victoria [name] has been diagnosed with rectal cancer. The endoscopic ultrasound is consistent with clinical stage III disease. The isolated small lung lesion is likely a benign finding. We will review the chest CT at the GI tumor conference and decide on the indication for additional evaluation of this lesion.  I discussed the diagnosis of rectal cancer and treatment options with Victoria [name] and her husband. She saw Dr. Mitzi Hansen and is scheduled to begin neoadjuvant radiation on 10/16/2013. I recommend concurrent capecitabine chemotherapy.  She will be referred for surgery at the completion of neoadjuvant therapy. We will base adjuvant systemic therapy on the final surgical pathology.  I recommend capecitabine to be given on the days of radiation. We reviewed the specific toxicities associated with capecitabine including the chance for mucositis, diarrhea, hematologic toxicity, rash, hyperpigmentation, and the hand/foot syndrome. She will attend a chemotherapy teaching class.  Victoria [name] will return for an office visit after approximately 2 weeks of capecitabine/radiation.  Approximately 50 minutes were spent with patient today. The majority of the time was used for counseling and coordination of care.  Raylinn Kosar 10/09/2013, 5:31 PM

## 2013-10-09 NOTE — Progress Notes (Signed)
Checked in new pt with no financial concerns. °

## 2013-10-09 NOTE — Telephone Encounter (Signed)
gv pt appt schedule for November and December.  °

## 2013-10-09 NOTE — Progress Notes (Signed)
Patient declined flu vaccine-does not usually take them. Does express interest in being seen by rehab in regards to the pelvic pain/dysfunction therapy program in regards to her rectal cancer and upcoming treatment. GI Navigator will go in more detail in regards to this.

## 2013-10-09 NOTE — Progress Notes (Signed)
Met with [name] and family. Explained role of nurse navigator. Educational information provided on colorectal cancer and on Xeloda.   [facility] resources provided to patient, including SW services and support group information. Referral made to dietician for diet education and to Outpatient PT program for pre-habilitation.   Contact names and phone numbers were provided for entire [facility] team.  Teach back method was used.  No barriers to care identified.  Patient was without questions and verbalized understanding.  Will continue to follow as needed.

## 2013-10-10 ENCOUNTER — Encounter: Payer: Self-pay | Admitting: Internal Medicine

## 2013-10-10 ENCOUNTER — Telehealth: Payer: Self-pay | Admitting: Oncology

## 2013-10-10 NOTE — Progress Notes (Signed)
Faxed xeloda prescription to [facility] °

## 2013-10-10 NOTE — Telephone Encounter (Signed)
Called pt and left message regarding chemo class @ 1230p on 11/13th

## 2013-10-10 NOTE — Telephone Encounter (Signed)
Added lb appt for 11/17 @ 4pm. S/w pt husband. Also confirmed 11/13 appt and added comment to appt to send pt for new schedule.

## 2013-10-12 ENCOUNTER — Ambulatory Visit (HOSPITAL_BASED_OUTPATIENT_CLINIC_OR_DEPARTMENT_OTHER): Payer: BC Managed Care – PPO

## 2013-10-12 ENCOUNTER — Encounter: Payer: Self-pay | Admitting: *Deleted

## 2013-10-12 ENCOUNTER — Other Ambulatory Visit: Payer: Self-pay | Admitting: *Deleted

## 2013-10-12 MED ORDER — PROCHLORPERAZINE MALEATE 10 MG PO TABS: 10.0000 mg | ORAL_TABLET | Freq: Four times a day (QID) | ORAL | Status: DC | PRN

## 2013-10-12 NOTE — Progress Notes (Signed)
RECEIVED A FAX FROM [facility] CONCERNING A CONFIRMATION OF PRESCRIPTION SHIPMENT FOR CAPECITABINE ON 10/11/13.

## 2013-10-13 ENCOUNTER — Encounter: Payer: Self-pay | Admitting: *Deleted

## 2013-10-13 NOTE — Progress Notes (Signed)
RECEIVED A FAX FROM [facility] CONCERNING PATIENT UPDATE: INITIAL COUNSELING COMPLETED.

## 2013-10-16 ENCOUNTER — Encounter: Payer: Self-pay | Admitting: Radiation Oncology

## 2013-10-16 ENCOUNTER — Ambulatory Visit
Admission: RE | Admit: 2013-10-16 | Discharge: 2013-10-16 | Disposition: A | Discharge status: Home / Self Care | Payer: BC Managed Care – PPO | Source: Ambulatory Visit | Attending: Radiation Oncology | Admitting: Radiation Oncology

## 2013-10-16 ENCOUNTER — Other Ambulatory Visit (HOSPITAL_BASED_OUTPATIENT_CLINIC_OR_DEPARTMENT_OTHER): Payer: BC Managed Care – PPO | Admitting: Lab

## 2013-10-16 DIAGNOSIS — C20 Malignant neoplasm of rectum: Secondary | ICD-10-CM

## 2013-10-16 LAB — COMPREHENSIVE METABOLIC PANEL (CC13)
AST: 16 U/L (ref 5–34)
Albumin: 3.4 g/dL — ABNORMAL LOW (ref 3.5–5.0)
Anion Gap: 10 mEq/L (ref 3–11)
BUN: 7.3 mg/dL (ref 7.0–26.0)
CO2: 28 mEq/L (ref 22–29)
Calcium: 9.5 mg/dL (ref 8.4–10.4)
Chloride: 103 mEq/L (ref 98–109)
Creatinine: 1 mg/dL (ref 0.6–1.1)
Glucose: 96 mg/dl (ref 70–140)
Potassium: 3.7 mEq/L (ref 3.5–5.1)

## 2013-10-16 LAB — CBC WITH DIFFERENTIAL/PLATELET
BASO%: 0.3 % (ref 0.0–2.0)
Basophils Absolute: 0 10*3/uL (ref 0.0–0.1)
EOS%: 0.3 % (ref 0.0–7.0)
Eosinophils Absolute: 0 10*3/uL (ref 0.0–0.5)
HCT: 33.9 % — ABNORMAL LOW (ref 34.8–46.6)
HGB: 10.8 g/dL — ABNORMAL LOW (ref 11.6–15.9)
LYMPH%: 28.9 % (ref 14.0–49.7)
MCV: 79.8 fL (ref 79.5–101.0)
MONO#: 0.7 10*3/uL (ref 0.1–0.9)
NEUT#: 4.6 10*3/uL (ref 1.5–6.5)
NEUT%: 61.3 % (ref 38.4–76.8)
Platelets: 314 10*3/uL (ref 145–400)
RDW: 14.2 % (ref 11.2–14.5)
lymph#: 2.2 10*3/uL (ref 0.9–3.3)

## 2013-10-16 NOTE — Progress Notes (Signed)
Simulation verification note: The patient underwent simulation verification for treatment to her pelvis. Her isocenter is in good position and the multileaf collimators contoured the treatment volume appropriately. 

## 2013-10-16 NOTE — Progress Notes (Signed)
Victoria [name] came to the clinic with a question about her xeloda.  She was wondering when to take the second dose.  Advised her that the prescription says to take two times dialy after a meal so to take the first does after breakfast and the second dose after dinner.

## 2013-10-17 ENCOUNTER — Ambulatory Visit
Admission: RE | Admit: 2013-10-17 | Discharge: 2013-10-17 | Disposition: A | Discharge status: Home / Self Care | Payer: BC Managed Care – PPO | Source: Ambulatory Visit | Attending: Radiation Oncology | Admitting: Radiation Oncology

## 2013-10-18 ENCOUNTER — Ambulatory Visit
Admission: RE | Admit: 2013-10-18 | Discharge: 2013-10-18 | Disposition: A | Discharge status: Home / Self Care | Payer: BC Managed Care – PPO | Source: Ambulatory Visit | Attending: Radiation Oncology | Admitting: Radiation Oncology

## 2013-10-18 ENCOUNTER — Ambulatory Visit: Payer: BC Managed Care – PPO | Admitting: Nutrition

## 2013-10-18 ENCOUNTER — Other Ambulatory Visit: Payer: BC Managed Care – PPO

## 2013-10-18 NOTE — Progress Notes (Signed)
Patient is a 62 year old female diagnosed with rectal cancer.  She is a patient of Dr. Truett Perna.  Past medical history includes diverticulosis and HDL deficiency.  Medications include Xeloda, Compazine, and Metamucil.  Labs were reviewed.  Height: 65 inches. Weight: 172.4 pounds. Usual body weight: 209 pounds. BMI: 28.69.  Patient reports she has changed her eating habits.  She now consumes a healthier plant-based diet.  She has eliminated breads and pasta from her diet.  She has lost approximately 37 pounds over 5 months.  This is an 18% weight loss from usual body weight.  She attributes this to a change in diet.  However, she does report poor appetite.  She does need to force herself to consume meals and snacks throughout the day.  She denies nausea, vomiting, diarrhea, or constipation..  She does report a lactose intolerance.  Nutrition diagnosis: Food and nutrition related knowledge deficit related to new diagnosis of rectal cancer and associated treatments as evidenced by no prior need for nutrition related information.  Intervention: Patient and spouse were educated on a healthy diet including lean proteins to promote weight maintenance.  Patient was encouraged to eat 6 small meals/snacks daily.  I educated her to eat by the clock rather than waiting for a signal that she is hungry.  I have encouraged patient to continue regular activity with physician approval.  I have given her strategies for increasing intake, especially in the mornings.  I've recommended patient begin oral nutrition supplements to improve caloric intake.  I provided samples of these for her today.  Fact sheets were given.  Questions were answered.  Teach back method used.  Monitoring, evaluation, goals: Patient will tolerate increased calories and protein to promote weight maintenance.  She will tolerate one oral nutrition supplement daily.  Next visit: Patient wishes to contact me by telephone if she has questions or  concerns.

## 2013-10-19 ENCOUNTER — Ambulatory Visit
Admission: RE | Admit: 2013-10-19 | Discharge: 2013-10-19 | Disposition: A | Discharge status: Home / Self Care | Payer: BC Managed Care – PPO | Source: Ambulatory Visit | Attending: Radiation Oncology | Admitting: Radiation Oncology

## 2013-10-20 ENCOUNTER — Encounter: Payer: Self-pay | Admitting: Radiation Oncology

## 2013-10-20 ENCOUNTER — Ambulatory Visit
Admission: RE | Admit: 2013-10-20 | Discharge: 2013-10-20 | Disposition: A | Discharge status: Home / Self Care | Payer: BC Managed Care – PPO | Source: Ambulatory Visit | Attending: Radiation Oncology | Admitting: Radiation Oncology

## 2013-10-20 VITALS — BP 160/76 | HR 81 | Temp 98.4°F | Resp 20 | Wt 174.6 lb

## 2013-10-20 DIAGNOSIS — C20 Malignant neoplasm of rectum: Secondary | ICD-10-CM

## 2013-10-20 NOTE — Progress Notes (Signed)
Weekly rad txs, 5/25 completed, patient education done, rad book and sitz bath given, slight nausea in mornings, drinks hot tea, regular bowel movements , teach back  Given 6:09 PM

## 2013-10-20 NOTE — Progress Notes (Signed)
  Radiation Oncology         ([phone]) [phone] ________________________________  Name: Victoria [name] MRN: [id number]  Date: 10/04/2013  DOB: [birth year]  SIMULATION AND TREATMENT PLANNING NOTE  The patient presented for simulation for the patient's upcoming course of preoperative radiation for the diagnosis of rectal cancer. The patient was placed in a supine position. A customized alpha cradle was constructed toaid in patient immobilization. This complex treatment device will be used on a daily basis during the treatment. In this fashion a CT scan was obtained through the pelvic region and the isocenter was placed near midline within the pelvis.  The patient will initially be planned to receive a course of radiation to a dose of 45 gray. This will be accomplished in 25 fractions at 1.8 gray per fraction. This initial treatment will correspond to a 3-D conformal technique. The gross tumor volume has been contoured in addition to the rectum, bladder and femoral heads. DVH's of each of these structures have been requested and these will be carefully reviewed as part of the 3-D conformal treatment planning process. To accomplish this initial treatment, 4 customized blocks have been designed for this purpose. Each of these 4 complex treatment devices will be used on a daily basis during the initial course of his treatment. It is anticipated that the patient will then receive a boost for an additional 5.4 gray. The anticipated total dose therefore will be 50.4 gray.  Special treatment procedure The patient will receive chemotherapy during the course of radiation treatment. The patient may experience increased or overlapping toxicity due to this combined-modality approach and the patient will be monitored for such problems. This may include extra lab work as necessary. This therefore constitutes a special treatment procedure.    ________________________________  Radene Gunning, MD, PhD

## 2013-10-20 NOTE — Addendum Note (Signed)
Encounter addended by: Jonna Coup, MD on: 10/20/2013  6:20 PM<BR>     Documentation filed: Visit Diagnoses

## 2013-10-20 NOTE — Progress Notes (Signed)
   Department of Radiation Oncology  Phone:  [phone] Fax:        [phone]  Weekly Treatment Note    Name: Victoria [name] Date: 10/20/2013 MRN: [id number] DOB: Sep 20, 1951   Current dose: 9 Gy  Current fraction: 5   MEDICATIONS: Current Outpatient Prescriptions  Medication Sig Dispense Refill  . ASA-APAP-Caff Buffered 227-194-33 MG TABS Take 1 tablet by mouth as needed.      . capecitabine (XELODA) 500 MG tablet Take 3 tablets (1,500 mg total) by mouth 2 (two) times daily after a meal. Take on days of radiation only (M-F) Total daily dose = 3000mg   120 tablet  0  . prochlorperazine (COMPAZINE) 10 MG tablet Take 1 tablet (10 mg total) by mouth every 6 (six) hours as needed for nausea or vomiting.  30 tablet  0  . psyllium (METAMUCIL) 58.6 % powder Take by mouth daily. 1 tablespoon daily       No current facility-administered medications for this encounter.     ALLERGIES: Ointment base   LABORATORY DATA:  Lab Results  Component Value Date   WBC 7.5 10/16/2013   HGB 10.8* 10/16/2013   HCT 33.9* 10/16/2013   MCV 79.8 10/16/2013   PLT 314 10/16/2013   Lab Results  Component Value Date   NA 141 10/16/2013   K 3.7 10/16/2013   CO2 28 10/16/2013   Lab Results  Component Value Date   ALT 11 10/16/2013   AST 16 10/16/2013   ALKPHOS 94 10/16/2013   BILITOT 0.24 10/16/2013     NARRATIVE: Victoria [name] was seen today for weekly treatment management. The chart was checked and the patient's films were reviewed. The patient is doing well. She has a decreased appetite however and a little bit of queasiness. She does have some nausea medication which seems to adequately be working at this time. No diarrhea.  PHYSICAL EXAMINATION: weight is 174 lb 9.6 oz (79.198 kg). Her oral temperature is 98.4 F (36.9 C). Her blood pressure is 160/76 and her pulse is 81. Her respiration is 20.        ASSESSMENT: The patient is doing satisfactorily with  treatment.  PLAN: We will continue with the patient's radiation treatment as planned.

## 2013-10-22 ENCOUNTER — Ambulatory Visit
Admission: RE | Admit: 2013-10-22 | Discharge: 2013-10-22 | Disposition: A | Discharge status: Home / Self Care | Payer: BC Managed Care – PPO | Source: Ambulatory Visit | Attending: Radiation Oncology | Admitting: Radiation Oncology

## 2013-10-23 ENCOUNTER — Encounter: Payer: Self-pay | Admitting: Radiation Oncology

## 2013-10-23 ENCOUNTER — Ambulatory Visit
Admission: RE | Admit: 2013-10-23 | Discharge: 2013-10-23 | Disposition: A | Discharge status: Home / Self Care | Payer: BC Managed Care – PPO | Source: Ambulatory Visit | Attending: Radiation Oncology | Admitting: Radiation Oncology

## 2013-10-23 VITALS — BP 141/87 | HR 80 | Temp 98.6°F | Ht 65.0 in | Wt 171.4 lb

## 2013-10-23 DIAGNOSIS — C20 Malignant neoplasm of rectum: Secondary | ICD-10-CM

## 2013-10-23 NOTE — Progress Notes (Signed)
Victoria [name] has received 7 fractions to the rectal area.  She notes mild rectal discomfort today but attributes this to difficulty defecating with blood on tissue.  Denies any urinary issues, nor nausea today.

## 2013-10-23 NOTE — Progress Notes (Signed)
   Department of Radiation Oncology  Phone:  [phone] Fax:        [phone]  Weekly Treatment Note    Name: Victoria [name] Date: 10/23/2013 MRN: [id number] DOB: [birth year]   Current dose: 12.6 Gy  Current fraction: 7   MEDICATIONS: Current Outpatient Prescriptions  Medication Sig Dispense Refill  . ASA-APAP-Caff Buffered 227-194-33 MG TABS Take 1 tablet by mouth as needed.      . capecitabine (XELODA) 500 MG tablet Take 3 tablets (1,500 mg total) by mouth 2 (two) times daily after a meal. Take on days of radiation only (M-F) Total daily dose = 3000mg   120 tablet  0  . prochlorperazine (COMPAZINE) 10 MG tablet Take 1 tablet (10 mg total) by mouth every 6 (six) hours as needed for nausea or vomiting.  30 tablet  0  . psyllium (METAMUCIL) 58.6 % powder Take by mouth daily. 1 tablespoon daily       No current facility-administered medications for this encounter.     ALLERGIES: Ointment base   LABORATORY DATA:  Lab Results  Component Value Date   WBC 7.5 10/16/2013   HGB 10.8* 10/16/2013   HCT 33.9* 10/16/2013   MCV 79.8 10/16/2013   PLT 314 10/16/2013   Lab Results  Component Value Date   NA 141 10/16/2013   K 3.7 10/16/2013   CO2 28 10/16/2013   Lab Results  Component Value Date   ALT 11 10/16/2013   AST 16 10/16/2013   ALKPHOS 94 10/16/2013   BILITOT 0.24 10/16/2013     NARRATIVE: Victoria [name] was seen today for weekly treatment management. The chart was checked and the patient's films were reviewed. The patient is doing well. She had some discomfort with a bowel movement. Otherwise no complaints.  PHYSICAL EXAMINATION: height is 5\' 5"  (1.651 m) and weight is 171 lb 6.4 oz (77.747 kg). Her temperature is 98.6 F (37 C). Her blood pressure is 141/87 and her pulse is 80.        ASSESSMENT: The patient is doing satisfactorily with treatment.  PLAN: We will continue with the patient's radiation treatment as planned. The patient will  maintain loose stools to some degree and watch for regular bowel movements.

## 2013-10-24 ENCOUNTER — Ambulatory Visit
Admission: RE | Admit: 2013-10-24 | Discharge: 2013-10-24 | Disposition: A | Discharge status: Home / Self Care | Payer: BC Managed Care – PPO | Source: Ambulatory Visit | Attending: Radiation Oncology | Admitting: Radiation Oncology

## 2013-10-25 ENCOUNTER — Telehealth: Payer: Self-pay | Admitting: *Deleted

## 2013-10-25 ENCOUNTER — Ambulatory Visit
Admission: RE | Admit: 2013-10-25 | Discharge: 2013-10-25 | Disposition: A | Discharge status: Home / Self Care | Payer: BC Managed Care – PPO | Source: Ambulatory Visit | Attending: Radiation Oncology | Admitting: Radiation Oncology

## 2013-10-25 NOTE — Telephone Encounter (Signed)
Called to confirm OK to send out remaining #18 day supply of Xeloda? Confirmed yes.

## 2013-10-30 ENCOUNTER — Ambulatory Visit
Admission: RE | Admit: 2013-10-30 | Discharge: 2013-10-30 | Disposition: A | Discharge status: Home / Self Care | Payer: BC Managed Care – PPO | Source: Ambulatory Visit | Attending: Radiation Oncology | Admitting: Radiation Oncology

## 2013-10-30 ENCOUNTER — Ambulatory Visit: Payer: BC Managed Care – PPO | Attending: Oncology | Admitting: Physical Therapy

## 2013-10-31 ENCOUNTER — Other Ambulatory Visit (HOSPITAL_BASED_OUTPATIENT_CLINIC_OR_DEPARTMENT_OTHER): Payer: BC Managed Care – PPO | Admitting: Lab

## 2013-10-31 ENCOUNTER — Telehealth: Payer: Self-pay | Admitting: Oncology

## 2013-10-31 ENCOUNTER — Ambulatory Visit: Payer: BC Managed Care – PPO

## 2013-10-31 ENCOUNTER — Telehealth: Payer: Self-pay | Admitting: *Deleted

## 2013-10-31 ENCOUNTER — Ambulatory Visit (HOSPITAL_BASED_OUTPATIENT_CLINIC_OR_DEPARTMENT_OTHER): Payer: BC Managed Care – PPO | Admitting: Nurse Practitioner

## 2013-10-31 VITALS — BP 147/96 | HR 96 | Temp 99.2°F | Resp 20 | Ht 65.0 in | Wt 168.3 lb

## 2013-10-31 DIAGNOSIS — C20 Malignant neoplasm of rectum: Secondary | ICD-10-CM

## 2013-10-31 DIAGNOSIS — R634 Abnormal weight loss: Secondary | ICD-10-CM

## 2013-10-31 DIAGNOSIS — R911 Solitary pulmonary nodule: Secondary | ICD-10-CM

## 2013-10-31 LAB — CBC WITH DIFFERENTIAL/PLATELET
BASO%: 0.3 % (ref 0.0–2.0)
Basophils Absolute: 0 10*3/uL (ref 0.0–0.1)
EOS%: 1 % (ref 0.0–7.0)
Eosinophils Absolute: 0 10*3/uL (ref 0.0–0.5)
HGB: 11.2 g/dL — ABNORMAL LOW (ref 11.6–15.9)
LYMPH%: 12.8 % — ABNORMAL LOW (ref 14.0–49.7)
MCHC: 31.9 g/dL (ref 31.5–36.0)
MCV: 82.4 fL (ref 79.5–101.0)
MONO%: 10.5 % (ref 0.0–14.0)
NEUT#: 3.5 10*3/uL (ref 1.5–6.5)
RBC: 4.26 10*6/uL (ref 3.70–5.45)
RDW: 14.7 % — ABNORMAL HIGH (ref 11.2–14.5)
lymph#: 0.6 10*3/uL — ABNORMAL LOW (ref 0.9–3.3)

## 2013-10-31 NOTE — Progress Notes (Signed)
OFFICE PROGRESS NOTE  Interval history:  Ms. Thomassen returns for followup of rectal cancer. She began radiation and concurrent Xeloda on 10/16/2013. She has had a single episode of mild nausea. No vomiting. No mouth sores. No hand or foot pain or redness. She is having multiple formed stools per day. No rectal bleeding. She notes an alteration in taste.   Objective: Blood pressure 147/96, pulse 96, temperature 99.2 F (37.3 C), temperature source Oral, resp. rate 20, height 5\' 5"  (1.651 m), weight 168 lb 4.8 oz (76.34 kg).  No thrush or ulcerations. Lungs are clear. Regular cardiac rhythm. Abdomen soft and nontender. No hepatomegaly. Extremities without edema. Calves soft and nontender. Palms without erythema. Hyperpigmentation at the perineum. No skin breakdown.  Lab Results: Lab Results  Component Value Date   WBC 4.6 10/31/2013   HGB 11.2* 10/31/2013   HCT 35.1 10/31/2013   MCV 82.4 10/31/2013   PLT 240 10/31/2013    Chemistry:    Chemistry      Component Value Date/Time   NA 141 10/16/2013 1605   K 3.7 10/16/2013 1605   CO2 28 10/16/2013 1605   BUN 7.3 10/16/2013 1605   CREATININE 1.0 10/16/2013 1605      Component Value Date/Time   CALCIUM 9.5 10/16/2013 1605   ALKPHOS 94 10/16/2013 1605   AST 16 10/16/2013 1605   ALT 11 10/16/2013 1605   BILITOT 0.24 10/16/2013 1605       Studies/Results: No results found.  Medications: I have reviewed the patient's current medications.  Assessment/Plan:  1. 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   Initiation of radiation and concurrent Xeloda 10/16/2013. 2. Indeterminate right lung nodule on CT 09/29/2013. 3. Weight loss. She will try drinking a nutritional supplement.  Disposition-she appears stable. She continues radiation and Xeloda. She will return for a followup visit on 11/20/2013. She will contact the office in the interim with any problems.  Plan reviewed with Dr. Truett Perna.  Lonna Cobb  ANP/GNP-BC

## 2013-10-31 NOTE — Telephone Encounter (Signed)
gv and printed appt sched and avs for pt for DEC...MD to change time to 3:45pm

## 2013-10-31 NOTE — Telephone Encounter (Signed)
Called and spoke with patient, cancelled todays radiation treatment, machine is down for the day, come back tomorrow regular scheduled time, keep your appts with Med/Onc though, patient thanked me for the call 11:21 AM

## 2013-11-01 ENCOUNTER — Ambulatory Visit
Admission: RE | Admit: 2013-11-01 | Discharge: 2013-11-01 | Disposition: A | Discharge status: Home / Self Care | Payer: BC Managed Care – PPO | Source: Ambulatory Visit | Attending: Radiation Oncology | Admitting: Radiation Oncology

## 2013-11-02 ENCOUNTER — Ambulatory Visit
Admission: RE | Admit: 2013-11-02 | Discharge: 2013-11-02 | Disposition: A | Discharge status: Home / Self Care | Payer: BC Managed Care – PPO | Source: Ambulatory Visit | Attending: Radiation Oncology | Admitting: Radiation Oncology

## 2013-11-02 ENCOUNTER — Encounter: Payer: Self-pay | Admitting: Radiation Oncology

## 2013-11-02 VITALS — BP 147/89 | HR 70 | Resp 16 | Wt 170.8 lb

## 2013-11-02 DIAGNOSIS — C20 Malignant neoplasm of rectum: Secondary | ICD-10-CM

## 2013-11-02 NOTE — Progress Notes (Signed)
Denies nausea, vomiting or diarrhea. Reports this morning she experience low abdominal pain for which she took the aspirin tylenol combination. Reports decreased appetite. Weight stable.

## 2013-11-02 NOTE — Progress Notes (Signed)
   Department of Radiation Oncology  Phone:  [phone] Fax:        [phone]  Weekly Treatment Note    Name: Victoria [name] Date: 11/02/2013 MRN: [id number] DOB: [birth year]   Current dose: 21.6 Gy  Current fraction: 12   MEDICATIONS: Current Outpatient Prescriptions  Medication Sig Dispense Refill  . ASA-APAP-Caff Buffered 227-194-33 MG TABS Take 1 tablet by mouth as needed.      . capecitabine (XELODA) 500 MG tablet Take 3 tablets (1,500 mg total) by mouth 2 (two) times daily after a meal. Take on days of radiation only (M-F) Total daily dose = 3000mg   120 tablet  0  . prochlorperazine (COMPAZINE) 10 MG tablet Take 1 tablet (10 mg total) by mouth every 6 (six) hours as needed for nausea or vomiting.  30 tablet  0  . psyllium (METAMUCIL) 58.6 % powder Take by mouth daily. 1 tablespoon daily       No current facility-administered medications for this encounter.     ALLERGIES: Ointment base   LABORATORY DATA:  Lab Results  Component Value Date   WBC 4.6 10/31/2013   HGB 11.2* 10/31/2013   HCT 35.1 10/31/2013   MCV 82.4 10/31/2013   PLT 240 10/31/2013   Lab Results  Component Value Date   NA 141 10/16/2013   K 3.7 10/16/2013   CO2 28 10/16/2013   Lab Results  Component Value Date   ALT 11 10/16/2013   AST 16 10/16/2013   ALKPHOS 94 10/16/2013   BILITOT 0.24 10/16/2013     NARRATIVE: Victoria [name] was seen today for weekly treatment management. The chart was checked and the patient's films were reviewed. The patient is doing well overall she feels. Her dominate complaint is a decrease appetite. No nausea. No diarrhea.  PHYSICAL EXAMINATION: weight is 170 lb 12.8 oz (77.474 kg). Her blood pressure is 147/89 and her pulse is 70. Her respiration is 16.        ASSESSMENT: The patient is doing satisfactorily with treatment.  PLAN: We will continue with the patient's radiation treatment as planned.

## 2013-11-03 ENCOUNTER — Ambulatory Visit: Payer: BC Managed Care – PPO

## 2013-11-03 ENCOUNTER — Ambulatory Visit
Admission: RE | Admit: 2013-11-03 | Discharge: 2013-11-03 | Disposition: A | Discharge status: Home / Self Care | Payer: BC Managed Care – PPO | Source: Ambulatory Visit | Attending: Radiation Oncology | Admitting: Radiation Oncology

## 2013-11-06 ENCOUNTER — Ambulatory Visit
Admission: RE | Admit: 2013-11-06 | Discharge: 2013-11-06 | Disposition: A | Discharge status: Home / Self Care | Payer: BC Managed Care – PPO | Source: Ambulatory Visit | Attending: Radiation Oncology | Admitting: Radiation Oncology

## 2013-11-07 ENCOUNTER — Ambulatory Visit
Admission: RE | Admit: 2013-11-07 | Discharge: 2013-11-07 | Disposition: A | Discharge status: Home / Self Care | Payer: BC Managed Care – PPO | Source: Ambulatory Visit | Attending: Radiation Oncology | Admitting: Radiation Oncology

## 2013-11-08 ENCOUNTER — Ambulatory Visit
Admission: RE | Admit: 2013-11-08 | Discharge: 2013-11-08 | Disposition: A | Discharge status: Home / Self Care | Payer: BC Managed Care – PPO | Source: Ambulatory Visit | Attending: Radiation Oncology | Admitting: Radiation Oncology

## 2013-11-09 ENCOUNTER — Ambulatory Visit
Admission: RE | Admit: 2013-11-09 | Discharge: 2013-11-09 | Disposition: A | Discharge status: Home / Self Care | Payer: BC Managed Care – PPO | Source: Ambulatory Visit | Attending: Radiation Oncology | Admitting: Radiation Oncology

## 2013-11-09 DIAGNOSIS — C20 Malignant neoplasm of rectum: Secondary | ICD-10-CM

## 2013-11-10 ENCOUNTER — Ambulatory Visit
Admission: RE | Admit: 2013-11-10 | Discharge: 2013-11-10 | Disposition: A | Discharge status: Home / Self Care | Payer: BC Managed Care – PPO | Source: Ambulatory Visit | Attending: Radiation Oncology | Admitting: Radiation Oncology

## 2013-11-10 ENCOUNTER — Other Ambulatory Visit: Payer: Self-pay | Admitting: *Deleted

## 2013-11-10 ENCOUNTER — Encounter: Payer: Self-pay | Admitting: Radiation Oncology

## 2013-11-10 VITALS — BP 147/79 | HR 82 | Temp 98.5°F | Resp 20 | Wt 168.1 lb

## 2013-11-10 DIAGNOSIS — C20 Malignant neoplasm of rectum: Secondary | ICD-10-CM

## 2013-11-10 NOTE — Telephone Encounter (Signed)
PT. DOES NOT NEED ANY MORE CAPECITABINE. SHE HAS ENOUGH MEDICATION TO COMPLETE THERAPY.

## 2013-11-10 NOTE — Progress Notes (Signed)
   Department of Radiation Oncology  Phone:  [phone] Fax:        [phone]  Weekly Treatment Note    Name: Victoria [name] Date: 11/10/2013 MRN: [id number] DOB: 05-25-1951   Current dose: 32.4 Gy  Current fraction: 18   MEDICATIONS: Current Outpatient Prescriptions  Medication Sig Dispense Refill  . ASA-APAP-Caff Buffered 227-194-33 MG TABS Take 1 tablet by mouth as needed.      . capecitabine (XELODA) 500 MG tablet Take 3 tablets (1,500 mg total) by mouth 2 (two) times daily after a meal. Take on days of radiation only (M-F) Total daily dose = 3000mg   120 tablet  0  . prochlorperazine (COMPAZINE) 10 MG tablet Take 1 tablet (10 mg total) by mouth every 6 (six) hours as needed for nausea or vomiting.  30 tablet  0  . psyllium (METAMUCIL) 58.6 % powder Take by mouth daily. 1 tablespoon daily       No current facility-administered medications for this encounter.     ALLERGIES: Ointment base   LABORATORY DATA:  Lab Results  Component Value Date   WBC 4.6 10/31/2013   HGB 11.2* 10/31/2013   HCT 35.1 10/31/2013   MCV 82.4 10/31/2013   PLT 240 10/31/2013   Lab Results  Component Value Date   NA 141 10/16/2013   K 3.7 10/16/2013   CO2 28 10/16/2013   Lab Results  Component Value Date   ALT 11 10/16/2013   AST 16 10/16/2013   ALKPHOS 94 10/16/2013   BILITOT 0.24 10/16/2013     NARRATIVE: Victoria [name] was seen today for weekly treatment management. The chart was checked and the patient's films were reviewed. The patient is doing fairly well with treatment. She had a little nausea earlier in the week but none currently. No diarrhea.  PHYSICAL EXAMINATION: weight is 168 lb 1.6 oz (76.25 kg). Her oral temperature is 98.5 F (36.9 C). Her blood pressure is 147/79 and her pulse is 82. Her respiration is 20.        ASSESSMENT: The patient is doing satisfactorily with treatment.  PLAN: We will continue with the patient's radiation treatment as planned.  The patient is using Vaseline a couple of times a day and she does feel that this is helping. We will change this. I'm pleased overall with how she is doing.

## 2013-11-10 NOTE — Progress Notes (Signed)
  Radiation Oncology         [phone] ________________________________  Name: Victoria [name] MRN: [id number]  Date: 11/09/2013  DOB: 01-20-1951  COMPLEX SIMULATION  NOTE  Diagnosis: rectal cancer  Narrative The patient has initially been planned to receive a course of radiation treatment to a dose of 45 gray in 25 fractions at 1.8 gray per fraction. The patient will now receive a boost to the high risk target volume for an additional 5.4 gray. This will be delivered in 3 fractions at 1.8 gray per fraction and a cone down boost technique will be utilized. To accomplish this, an additional 4 customized blocks have been designed for this purpose. A complex isodose plan is requested to ensure that the high-risk target region receives the appropriate radiation dose and that the nearby normal structures continue to be appropriately spared. The patient's final total dose therefore will be 50.4 gray.   ________________________________ ------------------------------------------------  Radene Gunning, MD, PhD

## 2013-11-10 NOTE — Progress Notes (Signed)
Weekly rad txs rectal, 18 completed, slight irritation to bottom, states she puts vaseline there in mornings only, helps, no nausea or diarrhea, fatigued, appetite fair 4:54 PM

## 2013-11-10 NOTE — Telephone Encounter (Signed)
THIS REFILL REQUEST FOR CAPECITABINE WAS GIVEN TO DR.SHERRILL'S NURSE, TANYA WHITLOCK,RN. 

## 2013-11-13 ENCOUNTER — Ambulatory Visit
Admission: RE | Admit: 2013-11-13 | Discharge: 2013-11-13 | Disposition: A | Discharge status: Home / Self Care | Payer: BC Managed Care – PPO | Source: Ambulatory Visit | Attending: Radiation Oncology | Admitting: Radiation Oncology

## 2013-11-14 ENCOUNTER — Ambulatory Visit
Admission: RE | Admit: 2013-11-14 | Discharge: 2013-11-14 | Disposition: A | Discharge status: Home / Self Care | Payer: BC Managed Care – PPO | Source: Ambulatory Visit | Attending: Radiation Oncology | Admitting: Radiation Oncology

## 2013-11-15 ENCOUNTER — Ambulatory Visit
Admission: RE | Admit: 2013-11-15 | Discharge: 2013-11-15 | Disposition: A | Discharge status: Home / Self Care | Payer: BC Managed Care – PPO | Source: Ambulatory Visit | Attending: Radiation Oncology | Admitting: Radiation Oncology

## 2013-11-16 ENCOUNTER — Ambulatory Visit
Admission: RE | Admit: 2013-11-16 | Discharge: 2013-11-16 | Disposition: A | Discharge status: Home / Self Care | Payer: BC Managed Care – PPO | Source: Ambulatory Visit | Attending: Radiation Oncology | Admitting: Radiation Oncology

## 2013-11-17 ENCOUNTER — Encounter: Payer: Self-pay | Admitting: Radiation Oncology

## 2013-11-17 ENCOUNTER — Ambulatory Visit
Admission: RE | Admit: 2013-11-17 | Discharge: 2013-11-17 | Disposition: A | Discharge status: Home / Self Care | Payer: BC Managed Care – PPO | Source: Ambulatory Visit | Attending: Radiation Oncology | Admitting: Radiation Oncology

## 2013-11-17 VITALS — BP 147/93 | HR 83 | Temp 99.1°F | Resp 16 | Wt 168.7 lb

## 2013-11-17 DIAGNOSIS — C20 Malignant neoplasm of rectum: Secondary | ICD-10-CM

## 2013-11-17 NOTE — Progress Notes (Signed)
   Department of Radiation Oncology  Phone:  [phone] Fax:        [phone]  Weekly Treatment Note    Name: Victoria [name] Date: 11/17/2013 MRN: [id number] DOB: [birth year]   Current dose: 41.4 Gy  Current fraction: 23   MEDICATIONS: Current Outpatient Prescriptions  Medication Sig Dispense Refill  . ASA-APAP-Caff Buffered 227-194-33 MG TABS Take 1 tablet by mouth as needed.      . capecitabine (XELODA) 500 MG tablet Take 3 tablets (1,500 mg total) by mouth 2 (two) times daily after a meal. Take on days of radiation only (M-F) Total daily dose = 3000mg   120 tablet  0  . prochlorperazine (COMPAZINE) 10 MG tablet Take 1 tablet (10 mg total) by mouth every 6 (six) hours as needed for nausea or vomiting.  30 tablet  0  . psyllium (METAMUCIL) 58.6 % powder Take by mouth daily. 1 tablespoon daily       No current facility-administered medications for this encounter.     ALLERGIES: Ointment base   LABORATORY DATA:  Lab Results  Component Value Date   WBC 4.6 10/31/2013   HGB 11.2* 10/31/2013   HCT 35.1 10/31/2013   MCV 82.4 10/31/2013   PLT 240 10/31/2013   Lab Results  Component Value Date   NA 141 10/16/2013   K 3.7 10/16/2013   CO2 28 10/16/2013   Lab Results  Component Value Date   ALT 11 10/16/2013   AST 16 10/16/2013   ALKPHOS 94 10/16/2013   BILITOT 0.24 10/16/2013     NARRATIVE: Victoria [name] was seen today for weekly treatment management. The chart was checked and the patient's films were reviewed. The patient is fair. She does complain of some pain in the rectal area. She states that she uses the 2 Tuesday each night and she feels that this really helps. She is using Metamucil daily. Some fatigue.  PHYSICAL EXAMINATION: weight is 168 lb 11.2 oz (76.522 kg). Her oral temperature is 99.1 F (37.3 C). Her blood pressure is 147/93 and her pulse is 83. Her respiration is 16.        ASSESSMENT: The patient is doing satisfactorily with  treatment.  PLAN: We will continue with the patient's radiation treatment as planned.

## 2013-11-17 NOTE — Progress Notes (Signed)
23/28 rectal ca rad tx s completed, semi soft formed stools daily, c/o pain in rectakl area, no skin irritation stated, takes vanquish for pain, taking metamucil daily as well, eating okay, slight fatigue, no c/o nausea, no gas stated 4:53 PM

## 2013-11-20 ENCOUNTER — Ambulatory Visit (HOSPITAL_BASED_OUTPATIENT_CLINIC_OR_DEPARTMENT_OTHER): Payer: BC Managed Care – PPO | Admitting: Nurse Practitioner

## 2013-11-20 ENCOUNTER — Other Ambulatory Visit: Payer: BC Managed Care – PPO

## 2013-11-20 ENCOUNTER — Ambulatory Visit: Payer: BC Managed Care – PPO | Admitting: Nurse Practitioner

## 2013-11-20 ENCOUNTER — Ambulatory Visit
Admission: RE | Admit: 2013-11-20 | Discharge: 2013-11-20 | Disposition: A | Discharge status: Home / Self Care | Payer: BC Managed Care – PPO | Source: Ambulatory Visit | Attending: Radiation Oncology | Admitting: Radiation Oncology

## 2013-11-20 VITALS — BP 141/68 | HR 84 | Temp 98.1°F | Resp 20 | Ht 65.0 in | Wt 167.3 lb

## 2013-11-20 DIAGNOSIS — C20 Malignant neoplasm of rectum: Secondary | ICD-10-CM

## 2013-11-20 LAB — CBC WITH DIFFERENTIAL/PLATELET
BASO%: 0.2 % (ref 0.0–2.0)
Basophils Absolute: 0 10*3/uL (ref 0.0–0.1)
EOS%: 3.1 % (ref 0.0–7.0)
HCT: 34.4 % — ABNORMAL LOW (ref 34.8–46.6)
HGB: 11 g/dL — ABNORMAL LOW (ref 11.6–15.9)
LYMPH%: 10.8 % — ABNORMAL LOW (ref 14.0–49.7)
MCH: 27 pg (ref 25.1–34.0)
MCHC: 31.9 g/dL (ref 31.5–36.0)
MCV: 84.6 fL (ref 79.5–101.0)
MONO#: 0.5 10*3/uL (ref 0.1–0.9)
NEUT%: 73.6 % (ref 38.4–76.8)
Platelets: 246 10*3/uL (ref 145–400)
lymph#: 0.5 10*3/uL — ABNORMAL LOW (ref 0.9–3.3)

## 2013-11-20 LAB — COMPREHENSIVE METABOLIC PANEL (CC13)
ALT: 17 U/L (ref 0–55)
Anion Gap: 8 mEq/L (ref 3–11)
BUN: 8.8 mg/dL (ref 7.0–26.0)
CO2: 31 mEq/L — ABNORMAL HIGH (ref 22–29)
Calcium: 9.3 mg/dL (ref 8.4–10.4)
Chloride: 103 mEq/L (ref 98–109)
Creatinine: 1 mg/dL (ref 0.6–1.1)
Glucose: 118 mg/dl (ref 70–140)
Total Bilirubin: 0.25 mg/dL (ref 0.20–1.20)

## 2013-11-20 NOTE — Progress Notes (Signed)
OFFICE PROGRESS NOTE  Interval history:  Victoria [name] returns as scheduled. She continues radiation and Xeloda. She denies mouth sores. No nausea or vomiting. No diarrhea. No rectal bleeding. She notes that her feet are "tender". She denies redness or skin breakdown.   Objective: Blood pressure 141/68, pulse 84, temperature 98.1 F (36.7 C), temperature source Oral, resp. rate 20, height 5\' 5"  (1.651 m), weight 167 lb 4.8 oz (75.887 kg).  Oropharynx is without thrush or ulceration. Lungs are clear. Regular cardiac rhythm. Abdomen soft and nontender. No hepatomegaly. No leg edema. Calves soft and nontender. Palms and soles without erythema. No skin breakdown. Hyperpigmentation at the perineum. No skin breakdown.  Lab Results: Lab Results  Component Value Date   WBC 4.2 11/20/2013   HGB 11.0* 11/20/2013   HCT 34.4* 11/20/2013   MCV 84.6 11/20/2013   PLT 246 11/20/2013    Chemistry:    Chemistry      Component Value Date/Time   NA 143 11/20/2013 1546   K 3.7 11/20/2013 1546   CO2 31* 11/20/2013 1546   BUN 8.8 11/20/2013 1546   CREATININE 1.0 11/20/2013 1546      Component Value Date/Time   CALCIUM 9.3 11/20/2013 1546   ALKPHOS 93 11/20/2013 1546   AST 17 11/20/2013 1546   ALT 17 11/20/2013 1546   BILITOT 0.25 11/20/2013 1546       Studies/Results: No results found.  Medications: I have reviewed the patient's current medications.  Assessment/Plan:  1. 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  Initiation of radiation and concurrent Xeloda 10/16/2013. 2. Indeterminate right lung nodule on CT 09/29/2013. 3. Weight loss. Weight is stable. 4. Foot "tenderness". Likely related to Xeloda. She will contact the office if this worsens or she develops erythema and or skin breakdown.  Disposition-she appears stable. She is scheduled to complete the course of radiation on 11/27/2013. She understands to discontinue Xeloda coinciding with completion of  radiation.   We are referring her to Dr. Romie Levee for surgery planning. She will return for a followup visit here in mid to late February to review the surgery pathology and discuss any additional chemotherapy.  She will contact the office prior to her next visit as outlined above or with any other problems.   Plan reviewed with Dr. Truett Perna.    Victoria [name] ANP/GNP-BC

## 2013-11-21 ENCOUNTER — Telehealth: Payer: Self-pay | Admitting: *Deleted

## 2013-11-21 ENCOUNTER — Ambulatory Visit: Payer: BC Managed Care – PPO

## 2013-11-21 ENCOUNTER — Ambulatory Visit
Admission: RE | Admit: 2013-11-21 | Discharge: 2013-11-21 | Disposition: A | Discharge status: Home / Self Care | Payer: BC Managed Care – PPO | Source: Ambulatory Visit | Attending: Radiation Oncology | Admitting: Radiation Oncology

## 2013-11-21 NOTE — Telephone Encounter (Signed)
sw pt spouse due to pt didn't feel like talking. gv appt for 01/22/14 w/labs@11 :15am and ov@ 11:45am. Pt is aware that i will mail a letter/avs...td

## 2013-11-22 ENCOUNTER — Ambulatory Visit: Payer: BC Managed Care – PPO

## 2013-11-22 ENCOUNTER — Ambulatory Visit
Admission: RE | Admit: 2013-11-22 | Discharge: 2013-11-22 | Disposition: A | Discharge status: Home / Self Care | Payer: BC Managed Care – PPO | Source: Ambulatory Visit | Attending: Radiation Oncology | Admitting: Radiation Oncology

## 2013-11-22 DIAGNOSIS — C20 Malignant neoplasm of rectum: Secondary | ICD-10-CM

## 2013-11-23 ENCOUNTER — Ambulatory Visit: Payer: BC Managed Care – PPO

## 2013-11-24 ENCOUNTER — Ambulatory Visit
Admission: RE | Admit: 2013-11-24 | Discharge: 2013-11-24 | Disposition: A | Discharge status: Home / Self Care | Payer: BC Managed Care – PPO | Source: Ambulatory Visit | Attending: Radiation Oncology | Admitting: Radiation Oncology

## 2013-11-24 ENCOUNTER — Ambulatory Visit: Payer: BC Managed Care – PPO

## 2013-11-24 ENCOUNTER — Encounter: Payer: Self-pay | Admitting: Radiation Oncology

## 2013-11-24 VITALS — BP 148/84 | HR 85 | Temp 98.8°F | Resp 20 | Wt 164.9 lb

## 2013-11-24 DIAGNOSIS — C20 Malignant neoplasm of rectum: Secondary | ICD-10-CM

## 2013-11-24 NOTE — Progress Notes (Signed)
  Radiation Oncology         [phone] ________________________________  Name: Victoria [name] MRN: [id number]  Date: 11/24/2013  DOB: 06/01/1951  Weekly Radiation Therapy Management  Current Dose: 48.6 Gy     Planned Dose:  50.4 Gy  Narrative . . . . . . . . The patient presents for routine under treatment assessment.                                   The patient is without complaint.                                 Set-up films were reviewed.                                 The chart was checked. Physical Findings. . .  weight is 164 lb 14.4 oz (74.798 kg). Her oral temperature is 98.8 F (37.1 C). Her blood pressure is 148/84 and her pulse is 85. Her respiration is 20. . Weight essentially stable.  No significant changes. Impression . . . . . . . The patient is tolerating radiation. Plan . . . . . . . . . . . . Continue treatment as planned.  ________________________________  Artist Pais. Kathrynn Running, M.D.

## 2013-11-24 NOTE — Progress Notes (Signed)
weekly rad txs, rectum 27/28 completed, 3 bm's today no diarrhea, fair appetite, nausea this am resolved on its own, taking Xeloda , fatigue, no pain just irritation  Rectum stated, 1 month f/u appt card given 5:06 PM

## 2013-11-24 NOTE — Progress Notes (Signed)
  Radiation Oncology         [phone] ________________________________  Name: Victoria [name] MRN: [id number]  Date: 11/22/2013  DOB: [birth year]  Simulation Verification Note  Status: outpatient  NARRATIVE: The patient was brought to the treatment unit and placed in the planned treatment position. The clinical setup was verified. Then port films were obtained and uploaded to the radiation oncology medical record software.  The treatment beams were carefully compared against the planned radiation fields. The position location and shape of the radiation fields was reviewed. They targeted volume of tissue appears to be appropriately covered by the radiation beams. Organs at risk appear to be excluded as planned.  Based on my personal review, I approved the simulation verification. The patient's treatment will proceed as planned.  ------------------------------------------------  Artist Pais Kathrynn Running, M.D.

## 2013-11-27 ENCOUNTER — Ambulatory Visit
Admission: RE | Admit: 2013-11-27 | Discharge: 2013-11-27 | Disposition: A | Discharge status: Home / Self Care | Payer: BC Managed Care – PPO | Source: Ambulatory Visit | Attending: Radiation Oncology | Admitting: Radiation Oncology

## 2013-11-27 ENCOUNTER — Ambulatory Visit: Payer: BC Managed Care – PPO

## 2013-11-27 ENCOUNTER — Encounter: Payer: Self-pay | Admitting: Radiation Oncology

## 2013-11-28 ENCOUNTER — Ambulatory Visit: Payer: BC Managed Care – PPO | Admitting: Physical Therapy

## 2013-12-05 ENCOUNTER — Ambulatory Visit: Payer: BC Managed Care – PPO | Attending: Oncology | Admitting: Physical Therapy

## 2013-12-05 DIAGNOSIS — M629 Disorder of muscle, unspecified: Secondary | ICD-10-CM | POA: Diagnosis not present

## 2013-12-05 DIAGNOSIS — IMO0001 Reserved for inherently not codable concepts without codable children: Secondary | ICD-10-CM | POA: Insufficient documentation

## 2013-12-05 DIAGNOSIS — M2420 Disorder of ligament, unspecified site: Secondary | ICD-10-CM | POA: Insufficient documentation

## 2013-12-08 ENCOUNTER — Telehealth: Payer: Self-pay | Admitting: Dietician

## 2013-12-08 NOTE — Progress Notes (Signed)
  Radiation Oncology         [phone] ________________________________  Name: Victoria [name] MRN: [id number]  Date: 11/27/2013  DOB: [birth year]  End of Treatment Note  Diagnosis:   Rectal cancer     Indication for treatment:  Curative       Radiation treatment dates:   10/16/2013 through 11/27/2013  Site/dose:   The patient was treated to the pelvis to a dose of 45 gray initially. This consisted of a 3-D conformal 4 field technique. The patient then received a boost for an additional 5.4 gray using a 4 field technique. The patient's final dose was 50.4 gray.  Narrative: The patient tolerated radiation treatment relatively well.   No major difficulties for delays during her course of treatment.  Plan: The patient has completed radiation treatment. The patient will return to radiation oncology clinic for routine followup in one month. I advised the patient to call or return sooner if they have any questions or concerns related to their recovery or treatment. ________________________________  Jodelle Gross, M.D., Ph.D.

## 2013-12-08 NOTE — Telephone Encounter (Signed)
Brief Outpatient Oncology Nutrition Note  Patient has been identified to be at risk on malnutrition screen.  Wt Readings from Last 10 Encounters:  11/24/13 164 lb 14.4 oz (74.798 kg)  11/20/13 167 lb 4.8 oz (75.887 kg)  11/17/13 168 lb 11.2 oz (76.522 kg)  11/10/13 168 lb 1.6 oz (76.25 kg)  11/02/13 170 lb 12.8 oz (77.474 kg)  10/31/13 168 lb 4.8 oz (76.34 kg)  10/23/13 171 lb 6.4 oz (77.747 kg)  10/20/13 174 lb 9.6 oz (79.198 kg)  10/09/13 172 lb 6.4 oz (78.2 kg)  10/02/13 174 lb 3.2 oz (79.017 kg)   Patient with rectal cancer.  Called patient due to continued weight los.  Last saw [place] RD 10/18/13.  Spoke with husband who is concerned about patient's intake.  States that she is eating about 3/4 of what she used to eat.  XRT treatments finished.  Will not drink Ensure and cannot tolerate milk.    Encouraged patient to call [place] RD as needed.   Antonieta Iba, RD, LDN

## 2013-12-13 ENCOUNTER — Ambulatory Visit: Payer: BC Managed Care – PPO | Admitting: Physical Therapy

## 2013-12-13 DIAGNOSIS — IMO0001 Reserved for inherently not codable concepts without codable children: Secondary | ICD-10-CM | POA: Diagnosis not present

## 2013-12-20 ENCOUNTER — Ambulatory Visit (INDEPENDENT_AMBULATORY_CARE_PROVIDER_SITE_OTHER): Payer: BC Managed Care – PPO | Admitting: General Surgery

## 2013-12-20 ENCOUNTER — Telehealth (INDEPENDENT_AMBULATORY_CARE_PROVIDER_SITE_OTHER): Payer: Self-pay | Admitting: *Deleted

## 2013-12-20 ENCOUNTER — Encounter (INDEPENDENT_AMBULATORY_CARE_PROVIDER_SITE_OTHER): Payer: Self-pay | Admitting: General Surgery

## 2013-12-20 VITALS — BP 118/82 | HR 90 | Temp 98.1°F | Resp 24 | Ht 65.0 in | Wt 157.4 lb

## 2013-12-20 DIAGNOSIS — C20 Malignant neoplasm of rectum: Secondary | ICD-10-CM

## 2013-12-20 MED ORDER — METRONIDAZOLE 500 MG PO TABS
500.0000 mg | ORAL_TABLET | ORAL | Status: AC
Start: 2013-12-20 — End: 2013-12-27

## 2013-12-20 NOTE — Telephone Encounter (Signed)
I spoke with pt and informed her of the appt for her CT scan at [facility] on 12/22/13 with an arrival time of 10:30am.  I instructed her to go tomorrow and pick up the contrast.  She states understanding.

## 2013-12-20 NOTE — Progress Notes (Signed)
Chief Complaint  Patient presents with  . Rectal Cancer    HISTORY: Victoria [name] is a 63 y.o. female who presents to the office with rectal cancer.  This was found by Dr. Wynetta Emery during a screening colonoscopy on 09/19/2013.  At approximately 10 cm from the anal verge a mass was noted and biopsied.  A biopsy confirmed adenocarcinoma. She was referred for CTs of the chest, abdomen, and pelvis on 09/29/2013. The CT the chest revealed a 7 mm nodule at the right lung apex. No other nodules. No focal hepatic lesion. Circumferential thickening of the rectum over a 5 cm segment beginning approximately 3 cm from the anal verge. A 5 mm presacral lymph node was noted superior to the rectal thickening. No additional pelvic lymphadenopathy.  She was referred to Dr. Paulita Fujita and underwent an endoscopic ultrasound on 09/27/2013. A mass was noted at the left posterior lateral rectal wall from 8 cm to 13 cm from the anal verge. The mass was ulcerated and friable. Multiple regions of the tumor were noted to penetrate through the muscularis propria. A few malignant appearing perirectal nodes were seen. The tumor was staged as a uT3N1 lesion.  She was referred to Dr. Zella Richer. A mass was palpated 7.5 cm from the anal verge. There was a prominent left inguinal lymph node.  She has now completed neoadjuvant therapy and has been referred to me for surgery.  She denies any changes in bowel habits but does continue to loose weight due to lack of appetite. She denies any blood per rectum.  She denies any chest pain or dyspnea on exertion.   Past Medical History  Diagnosis Date  . Medical history non-contributory   . HDL deficiency   . Allergy     seasonal allergic rhinitis  . Cancer 09/19/13    rectum      Past Surgical History  Procedure Laterality Date  . Left leg surgery  1986    rod from hip to knee  . Colonoscopy with propofol N/A 09/19/2013    Procedure: COLONOSCOPY WITH PROPOFOL;  Surgeon: Garlan Fair, MD;  Location: [facility];  Service: Endoscopy;  Laterality: N/A;  . Femur fracture surgery Left 1985    steel rod  . Eus N/A 09/27/2013    Procedure: LOWER ENDOSCOPIC ULTRASOUND (EUS);  Surgeon: Arta Silence, MD;  Location: [facility];  Service: Endoscopy;  Laterality: N/A;      Current Outpatient Prescriptions  Medication Sig Dispense Refill  . ASA-APAP-Caff Buffered 227-194-33 MG TABS Take 1 tablet by mouth as needed.      . capecitabine (XELODA) 500 MG tablet Take 3 tablets (1,500 mg total) by mouth 2 (two) times daily after a meal. Take on days of radiation only (M-F) Total daily dose = 3000mg   120 tablet  0  . prochlorperazine (COMPAZINE) 10 MG tablet Take 1 tablet (10 mg total) by mouth every 6 (six) hours as needed for nausea or vomiting.  30 tablet  0  . psyllium (METAMUCIL) 58.6 % powder Take by mouth daily. 1 tablespoon daily       No current facility-administered medications for this visit.      Allergies  Allergen Reactions  . Ointment Base [Lanolin-Petrolatum] Rash    Specifically burn ointments.      Family History  Problem Relation Age of Onset  . Hypertension Mother   . Diabetes Father   . Heart disease Father     CAD      History  Social History  . Marital Status: Married    Spouse Name: Jeneen Rinks    Number of Children: 0  . Years of Education: N/A   Occupational History  .      [employer]   Social History Main Topics  . Smoking status: Never Smoker   . Smokeless tobacco: Never Used  . Alcohol Use: Yes     Comment: occasional wine  . Drug Use: No  . Sexual Activity: Not Currently   Other Topics Concern  . 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   No children       REVIEW OF SYSTEMS - PERTINENT POSITIVES ONLY: Review of Systems - General ROS: negative for - chills or fever Hematological and Lymphatic ROS: negative for - bleeding problems, blood  clots or blood transfusions Respiratory ROS: no cough, shortness of breath, or wheezing Cardiovascular ROS: no chest pain or dyspnea on exertion Gastrointestinal ROS: no abdominal pain, change in bowel habits, or black or bloody stools Genito-Urinary ROS: no dysuria, trouble voiding, or hematuria  EXAM: Filed Vitals:   12/20/13 1028  BP: 118/82  Pulse: 90  Temp: 98.1 F (36.7 C)  Resp: 24    Gen:  No acute distress.  Well nourished and well groomed.   Neurological: Alert and oriented to person, place, and time. Coordination normal.  Head: Normocephalic and atraumatic.  Eyes: Conjunctivae are normal. Pupils are equal, round, and reactive to light. No scleral icterus.  Neck: Normal range of motion. Neck supple. No tracheal deviation or thyromegaly present.  No cervical lymphadenopathy. Cardiovascular: Normal rate, regular rhythm, normal heart sounds and intact distal pulses.   Respiratory: Effort normal.  No respiratory distress. No chest wall tenderness. Breath sounds normal.  No wheezes, rales or rhonchi.  GI: Soft. Bowel sounds are normal. The abdomen is soft and nontender.  There is no rebound and no guarding.  Slight fullness in L abd Musculoskeletal: Normal range of motion. Extremities are nontender.  Skin: Skin is warm and dry. No rash noted. No diaphoresis. No erythema. No pallor. No clubbing, cyanosis, or edema.   Psychiatric: Normal mood and affect. Behavior is normal. Judgment and thought content normal.   Anorectal Exam: no palpable mass noted   LABORATORY RESULTS: Available labs are reviewed  Lab Results  Component Value Date   WBC 4.2 11/20/2013   HGB 11.0* 11/20/2013   HCT 34.4* 11/20/2013   MCV 84.6 11/20/2013   PLT 246 11/20/2013    Lab Results  Component Value Date   CEA 3.8 10/02/2013   Last Albumin: 3.4 on 12/29  RADIOLOGY RESULTS:   Images and reports are reviewed. CT CHEST ABD AND PELVIS IMPRESSION:  1. Circumferential rectal thickening over 5 cm  segment consists with primary carcinoma.  2. Small 5 mm presacral lymph node is concerning for local metastasis.  3. No evidence of or distant iliac nodal metastasis or retroperitoneal metastasis  4. Single right upper lobe pulmonary nodule measuring 7 mm is indeterminate. Recommend either attention on followup versus further evaluation with FDG PET/CT scan.     ASSESSMENT AND PLAN: Victoria [name] Is a 63 year old female in relatively good health who presents to the office with a advanced rectal cancer. She is status post neoadjuvant chemotherapy and radiation and is here today to discuss surgery. She continues to have trouble with appetite and losing weight. She reports no  other symptoms.  On exam I did not palpate her tumor digitally. I would like to perform a surgery in another 3-5 weeks. This will was likely be a low anterior resection with diverting ileostomy. I will repeat her laboratory today to evaluate her nutrition given her lack of appetite and poor eating habits. I will also repeat a CEA and her CT scans to make sure that there is not massive progression of disease causing her weight loss.  The surgery and anatomy were described to the patient as well as the risks of surgery and the possible complications. These include: Bleeding, infection and possible wound complications such as hernia, damage to adjacent structures, leak of surgical connections, which can lead to other surgeries and possibly an ostomy (5-7%), possible need for other procedures, such as abscess drains in radiology, possible prolonged hospital stay, possible diarrhea from removal of part of the colon, possible constipation from narcotics, prolonged fatigue/weakness or appetite loss, possible early recurrence of cancer, possible complications of their medical problems such as heart disease or arrhythmias or lung problems, death (less than 1%). I believe the patient understands and wishes to proceed with the  surgery.    Rosario Adie, MD Colon and Rectal Surgery / [facility]      Visit Diagnoses: 1. Rectal cancer     Primary Care Physician: Irven Shelling, MD

## 2013-12-20 NOTE — Patient Instructions (Addendum)
Try to eat a high protein diet over the next few weeks       [place] (1) PRE-OP HOME COLON PREP INSTRUCTIONS: ** MIRALAX / GATORADE PREP / FLAGYL**  You must follow the instructions below carefully.  If you have questions or problems, please call and speak to someone in the clinic department at our office:   [phone].     INSTRUCTIONS: 1. Five days prior to your procedure do not eat nuts, popcorn, or fruit with seeds.  Stop all fiber supplements such as Metamucil, Citrucel, etc. 2. Two days before surgery fill the prescription at a pharmacy of your choice and purchase the additional supplies below.         MIRALAX - GATORADE -- DULCOLAX TABS -- FLEET ENEMA:   Purchase a bottle of MIRALAX  (255 gm bottle)    In addition, purchase four (4) DULCOLAX TABLETS (no prescription required- ask the pharmacist if you can't find them)   Purchase one Fleet Enema (Green and white box)    Purchase one 64 oz GATORADE.  (Do NOT purchase red Gatorade; any other flavor is acceptable) and place in refrigerator to get cold.  3.   Day Before Surgery:   6 am: Wash you abdomen with soap and repeat this on the morning of surgery and take 4 Dulcolax tablets   You may only have clear liquids (tea, coffee, juice, broth, jello, soft drinks, gummy bears).  You cannot have solid foods, cream, milk or milk products.  Drink at lease 8 ounces of liquids every hour while awake.   Take the Flagyl prescription as directed at 8 am, 2 pm and 8 pm.  It is helpful to take this with some jello instead of on an empty stomach.  Any flavor is ok, except red jello, which will cause red stools.   Mix the entire bottle of MiraLax and the Gatorade in a large container.    10:00am: Begin drinking the Gatorade mixture until gone (8 oz every 15-30 minutes).      You may suck on a lime wedge or hard candy to "freshen your palate" in between glasses   If you are a diabetic, take your blood sugar reading several  time throughout the prep.  Have some juice available to take if your sugar level gets too low   You may feel chilled while taking the prep.  Have some warm tea or broth to help warm up.   Continue clear liquids until midnight or bedtime  3. The day of your procedure:   Do not eat or drink ANYTHING after midnight before your surgery.     If you take Heart or Blood Pressure medicine, ask the pre-op nurses about these during your preop appointment.   Take a regular Fleet Enema one hour before leaving the come to the hospital.  Try to hold it in 5-10 mins before expelling.   Further pre-operative instructions will be given to you from the hospital.   Expect to be contacted 5-7 days before your surgery.

## 2013-12-21 ENCOUNTER — Other Ambulatory Visit: Payer: BC Managed Care – PPO

## 2013-12-21 LAB — COMPREHENSIVE METABOLIC PANEL
ALT: 19 U/L (ref 0–35)
AST: 21 U/L (ref 0–37)
Albumin: 3.9 g/dL (ref 3.5–5.2)
Alkaline Phosphatase: 83 U/L (ref 39–117)
BUN: 7 mg/dL (ref 6–23)
CALCIUM: 9.6 mg/dL (ref 8.4–10.5)
CHLORIDE: 100 meq/L (ref 96–112)
CO2: 29 mEq/L (ref 19–32)
Creat: 0.89 mg/dL (ref 0.50–1.10)
GLUCOSE: 86 mg/dL (ref 70–99)
Potassium: 4 mEq/L (ref 3.5–5.3)
Sodium: 140 mEq/L (ref 135–145)
Total Bilirubin: 0.5 mg/dL (ref 0.3–1.2)
Total Protein: 7.5 g/dL (ref 6.0–8.3)

## 2013-12-21 LAB — CEA: CEA: <0.5 ng/mL (ref 0.0–5.0)

## 2013-12-22 ENCOUNTER — Ambulatory Visit
Admission: RE | Admit: 2013-12-22 | Discharge: 2013-12-22 | Disposition: A | Discharge status: Home / Self Care | Payer: BC Managed Care – PPO | Source: Ambulatory Visit | Attending: General Surgery | Admitting: General Surgery

## 2013-12-22 DIAGNOSIS — C20 Malignant neoplasm of rectum: Secondary | ICD-10-CM

## 2013-12-25 ENCOUNTER — Telehealth: Payer: Self-pay

## 2013-12-25 ENCOUNTER — Telehealth: Payer: Self-pay | Admitting: *Deleted

## 2013-12-25 ENCOUNTER — Encounter (INDEPENDENT_AMBULATORY_CARE_PROVIDER_SITE_OTHER): Payer: BC Managed Care – PPO | Admitting: General Surgery

## 2013-12-25 NOTE — Telephone Encounter (Signed)
Dr.Moody reviewed patient chart/scans and doesn't know why patient is having abdominal pain.States scans look good.Informed husband to call Dr.Outlaw and that I would leave message with Dr.Sherrill's office for any suggestions they may have. Patient is tentatively scheduled for up coming surgery.

## 2013-12-25 NOTE — Telephone Encounter (Signed)
Patient's spouse called to ask for pain medication to be called into [facility].Patient has been having unbearable abdominal pain and cramping.States pain was all through radiation but has gotten worse over the last few days.Patient had been taking otc  Vanquish for pain but no longer effective.No problems with bowels as she takes metamucil and eats prunes.

## 2013-12-25 NOTE — Telephone Encounter (Signed)
error 

## 2013-12-28 ENCOUNTER — Telehealth: Payer: Self-pay | Admitting: *Deleted

## 2013-12-28 NOTE — Telephone Encounter (Signed)
Called to follow up on status of her abdominal pain. She reports it is still present, but not severe-her OTC Vanquish helps. Seems to be worse in the mornings then gets better. She is having normal bowel movements and no fever or nausea. Instructed her if pain gets worse she needs to speak with her surgeon.

## 2014-01-01 ENCOUNTER — Encounter: Payer: Self-pay | Admitting: Radiation Oncology

## 2014-01-04 ENCOUNTER — Ambulatory Visit
Admission: RE | Admit: 2014-01-04 | Discharge: 2014-01-04 | Disposition: A | Discharge status: Home / Self Care | Payer: BC Managed Care – PPO | Source: Ambulatory Visit | Attending: Radiation Oncology | Admitting: Radiation Oncology

## 2014-01-04 ENCOUNTER — Encounter: Payer: Self-pay | Admitting: Radiation Oncology

## 2014-01-04 VITALS — BP 147/88 | HR 87 | Temp 98.5°F | Resp 20 | Ht 65.0 in | Wt 159.3 lb

## 2014-01-04 DIAGNOSIS — C20 Malignant neoplasm of rectum: Secondary | ICD-10-CM

## 2014-01-04 HISTORY — DX: Personal history of irradiation: Z92.3

## 2014-01-04 NOTE — Progress Notes (Signed)
Follow up rectal rad 10/16/13-11/27/13  Still has abdominal pain, 3-4 on 1-10 scale, taking vanquish which has aspirin in it and patient is having surgery next Friday with Dr. Leighton Ruff, , encouraged her to call and see if she shoyuld stop taking this med 5-7 days before surgery and she also has 3 pills for infection and doesn't when to start taking these, ", will call MD office today and find out, no nausea, appetite fair getting better stated, and still some fatigue at times, no bleeding, bowels move q 2-3 days, takes metamucil daily 12:09 PM

## 2014-01-04 NOTE — Progress Notes (Signed)
Radiation Oncology         [phone] ________________________________  Name: Victoria [name] MRN: [id number]  Date: 01/04/2014  DOB: Jan 03, 1951  Follow-Up Visit Note  CC: Irven Shelling, MD  Odis Hollingshead, MD  Diagnosis:   Rectal cancer  Interval Since Last Radiation:  Approximately one month   Narrative:  The patient returns today for routine follow-up.  The patient states that things have gone well since the end of treatment in terms of recovery. No significant ongoing  issues in terms of dysuria, rectal irritation. No blood per rectum.                         ALLERGIES:  is allergic to ointment base.  Meds: Current Outpatient Prescriptions  Medication Sig Dispense Refill  . ASA-APAP-Caff Buffered [id number] MG TABS Take 1 tablet by mouth as needed.      . psyllium (METAMUCIL) 58.6 % powder Take by mouth daily. 1 tablespoon daily      . prochlorperazine (COMPAZINE) 10 MG tablet Take 1 tablet (10 mg total) by mouth every 6 (six) hours as needed for nausea or vomiting.  30 tablet  0   No current facility-administered medications for this encounter.    Physical Findings: The patient is in no acute distress. Patient is alert and oriented.  height is 5\' 5"  (1.651 m) and weight is 159 lb 4.8 oz (72.258 kg). Her oral temperature is 98.5 F (36.9 C). Her blood pressure is 147/88 and her pulse is 87. Her respiration is 20 and oxygen saturation is 99%. .     Lab Findings: Lab Results  Component Value Date   WBC 4.2 11/20/2013   HGB 11.0* 11/20/2013   HCT 34.4* 11/20/2013   MCV 84.6 11/20/2013   PLT 246 11/20/2013     Radiographic Findings: Ct Chest W Contrast  12/22/2013   CLINICAL DATA:  Rectal cancer. Prior radiation treatment. Evaluate for progression of disease.  EXAM: CT CHEST, ABDOMEN, AND PELVIS WITH CONTRAST  TECHNIQUE: Multidetector CT imaging of the chest, abdomen and pelvis was performed following the standard protocol during bolus administration of  intravenous contrast.  CONTRAST:  100 cc Omnipaque 300 IV.  COMPARISON:  CT CHEST W/CM dated 09/29/2013; CT ABD/PELVIS W CM dated 09/29/2013  FINDINGS: CT CHEST FINDINGS  Heart is normal size. Aorta is normal caliber. No mediastinal, hilar, or axillary adenopathy. Chest wall soft tissues are unremarkable.  Nodular density again noted in the posterior right upper lobe measuring 6-7 mm, stable since prior study. No new pulmonary nodules. No pleural effusions. Linear density at the left base is stable, likely scarring.  No acute or focal bone lesion.  CT ABDOMEN AND PELVIS FINDINGS  Diffuse low density throughout the liver compatible with fatty infiltration. High-density material again noted within the gallbladder, likely gallstones. No focal hepatic lesion. Spleen, pancreas, adrenals and kidneys are unremarkable.  Stomach and small bowel are unremarkable. Appendix is visualized and is normal. Area circumferential wall thickening within the rectum is again noted, similar to prior study. Small adjacent perirectal lymph nodes are again on image 99, stable. No new or enlarging pelvic lymph nodes.  Uterus, adnexae and urinary bladder are unremarkable.  No acute or focal bone lesion.  IMPRESSION: Stable circumferential wall thickening within the region of the rectum with small adjacent perirectal lymph node. No new disease. No evidence of distant metastatic disease.  Diffuse fatty infiltration of the liver.  Cholelithiasis.   Electronically Signed  By: Rolm Baptise M.D.   On: 12/22/2013 14:13   Ct Abdomen Pelvis W Contrast  12/22/2013   CLINICAL DATA:  Rectal cancer. Prior radiation treatment. Evaluate for progression of disease.  EXAM: CT CHEST, ABDOMEN, AND PELVIS WITH CONTRAST  TECHNIQUE: Multidetector CT imaging of the chest, abdomen and pelvis was performed following the standard protocol during bolus administration of intravenous contrast.  CONTRAST:  100 cc Omnipaque 300 IV.  COMPARISON:  CT CHEST W/CM dated  09/29/2013; CT ABD/PELVIS W CM dated 09/29/2013  FINDINGS: CT CHEST FINDINGS  Heart is normal size. Aorta is normal caliber. No mediastinal, hilar, or axillary adenopathy. Chest wall soft tissues are unremarkable.  Nodular density again noted in the posterior right upper lobe measuring 6-7 mm, stable since prior study. No new pulmonary nodules. No pleural effusions. Linear density at the left base is stable, likely scarring.  No acute or focal bone lesion.  CT ABDOMEN AND PELVIS FINDINGS  Diffuse low density throughout the liver compatible with fatty infiltration. High-density material again noted within the gallbladder, likely gallstones. No focal hepatic lesion. Spleen, pancreas, adrenals and kidneys are unremarkable.  Stomach and small bowel are unremarkable. Appendix is visualized and is normal. Area circumferential wall thickening within the rectum is again noted, similar to prior study. Small adjacent perirectal lymph nodes are again on image 99, stable. No new or enlarging pelvic lymph nodes.  Uterus, adnexae and urinary bladder are unremarkable.  No acute or focal bone lesion.  IMPRESSION: Stable circumferential wall thickening within the region of the rectum with small adjacent perirectal lymph node. No new disease. No evidence of distant metastatic disease.  Diffuse fatty infiltration of the liver.  Cholelithiasis.   Electronically Signed   By: Rolm Baptise M.D.   On: 12/22/2013 14:13    Impression:    The patient is doing well approximately one month after completing preoperative chemoradiotherapy for rectal cancer. Surgery is scheduled next week.   Plan:  The patient will followup in 6 months.   Jodelle Gross, M.D., Ph.D.

## 2014-01-05 ENCOUNTER — Encounter (HOSPITAL_COMMUNITY): Payer: Self-pay | Admitting: Pharmacy Technician

## 2014-01-08 ENCOUNTER — Other Ambulatory Visit (HOSPITAL_COMMUNITY): Payer: Self-pay | Admitting: *Deleted

## 2014-01-08 ENCOUNTER — Telehealth (INDEPENDENT_AMBULATORY_CARE_PROVIDER_SITE_OTHER): Payer: Self-pay | Admitting: General Surgery

## 2014-01-08 NOTE — Telephone Encounter (Signed)
Pt called to ask about how long she would be out with the planned surgery, so she can inform her work.  After discussion, she will obtain and bring in the necessary forms (FMLA) to be filled in and sent to her employer.  She understands there is a fee for this.

## 2014-01-09 ENCOUNTER — Encounter (HOSPITAL_COMMUNITY): Payer: Self-pay

## 2014-01-09 ENCOUNTER — Encounter (HOSPITAL_COMMUNITY)
Admission: RE | Admit: 2014-01-09 | Discharge: 2014-01-09 | Disposition: A | Discharge status: Home / Self Care | Payer: BC Managed Care – PPO | Source: Ambulatory Visit | Attending: General Surgery | Admitting: General Surgery

## 2014-01-09 DIAGNOSIS — Z01812 Encounter for preprocedural laboratory examination: Secondary | ICD-10-CM | POA: Insufficient documentation

## 2014-01-09 LAB — CBC
HEMATOCRIT: 34.4 % — AB (ref 36.0–46.0)
HEMOGLOBIN: 11.6 g/dL — AB (ref 12.0–15.0)
MCH: 28.3 pg (ref 26.0–34.0)
MCHC: 33.7 g/dL (ref 30.0–36.0)
MCV: 83.9 fL (ref 78.0–100.0)
Platelets: 327 10*3/uL (ref 150–400)
RBC: 4.1 MIL/uL (ref 3.87–5.11)
RDW: 15.2 % (ref 11.5–15.5)
WBC: 6.1 10*3/uL (ref 4.0–10.5)

## 2014-01-09 LAB — BASIC METABOLIC PANEL
BUN: 8 mg/dL (ref 6–23)
CHLORIDE: 99 meq/L (ref 96–112)
CO2: 28 meq/L (ref 19–32)
Calcium: 9.4 mg/dL (ref 8.4–10.5)
Creatinine, Ser: 0.88 mg/dL (ref 0.50–1.10)
GFR calc Af Amer: 81 mL/min — ABNORMAL LOW (ref >90)
GFR calc non Af Amer: 69 mL/min — ABNORMAL LOW (ref >90)
Glucose, Bld: 100 mg/dL — ABNORMAL HIGH (ref 70–99)
POTASSIUM: 3.7 meq/L (ref 3.7–5.3)
Sodium: 139 mEq/L (ref 137–147)

## 2014-01-09 LAB — ABO/RH: ABO/RH(D): AB NEG

## 2014-01-09 NOTE — Patient Instructions (Signed)
      Your procedure is scheduled on:  01/12/14  FRIDAY  Report to [facility] at [place]      AM.  Call this number if you have problems the morning of surgery: [phone]       Remember: NO CHILDREN UNDER THE AGE OF 18 WILL BE ALLOWED TO VISIT DURING FLU SEASON AS PER [place]   Do not take ANYTHING BY MOUTH After Midnight. Thursday NIGHT   Take these medicines the morning of surgery with A SIP OF WATER:NONE BOWEL PREP AS PER OFFICE  .  Contacts, dentures or partial plates, or metal hairpins  can not be worn to surgery. Your family will be responsible for glasses, dentures, hearing aides while you are in surgery  Leave suitcase in the car. After surgery it may be brought to your room.  For patients admitted to the hospital, checkout time is 11:00 AM day of  discharge.                DO NOT WEAR JEWELRY, LOTIONS, POWDERS, OR PERFUMES.  WOMEN-- DO NOT SHAVE LEGS OR UNDERARMS FOR 48 HOURS BEFORE SHOWERS. MEN MAY SHAVE FACE.  Patients discharged the day of surgery will not be allowed to drive home. IF going home the day of surgery, you must have a driver and someone to stay with you for the first 24 hours  Name and phone number of your driver:                                                                        Please read over the following fact sheets that you were given Blood Transfusion Sheet  Information    Krissy Orebaugh  PST [phone]  [phone]                 [place]                                                  Patient Signature _____________________________

## 2014-01-09 NOTE — Consult Note (Signed)
WOC ostomy consult note:  Preoperative stoma site selection for possible ileostomy on 01/12/14 Patient seen per Dr. Marcello Moores' request for preoperative stoma site selection and education in the event a stoma is required during surgery on Friday.  Patient is in a wheelchair and is in a great deal of discomfort.  I am only able to view patient's abdomen in the seated position. Patient and spouse indicate that they are very hopeful that this is not needed and they decline information regarding an ostomy at this time.  Patient and spouse are aware that there is a specialty nurse who will assist them with post operative care and teaching if an ostomy is created.  Site selected in 6.5cm to the right of the umbilicus and 2cm above. Site is marked with a surgical skin marking pen and is covered with a thin film transparent dressing. [facility] nursing team will remain available to this patient, the nursing and surgical teams.  Please re-consult if and ostomy is created. Thanks, Maudie Flakes, MSN, RN, [place], [place], [place] [phone])

## 2014-01-12 ENCOUNTER — Encounter (HOSPITAL_COMMUNITY): Payer: BC Managed Care – PPO | Admitting: Registered Nurse

## 2014-01-12 ENCOUNTER — Encounter (HOSPITAL_COMMUNITY): Payer: Self-pay | Admitting: *Deleted

## 2014-01-12 ENCOUNTER — Inpatient Hospital Stay (HOSPITAL_COMMUNITY)
Admission: RE | Admit: 2014-01-12 | Discharge: 2014-01-17 | DRG: 330 | Disposition: A | Discharge status: Home / Self Care | Payer: BC Managed Care – PPO | Source: Ambulatory Visit | Attending: General Surgery | Admitting: General Surgery

## 2014-01-12 ENCOUNTER — Inpatient Hospital Stay (HOSPITAL_COMMUNITY): Payer: BC Managed Care – PPO | Admitting: Registered Nurse

## 2014-01-12 ENCOUNTER — Encounter (HOSPITAL_COMMUNITY)
Admission: RE | Disposition: A | Discharge status: Home / Self Care | Payer: Self-pay | Source: Ambulatory Visit | Attending: General Surgery

## 2014-01-12 DIAGNOSIS — C189 Malignant neoplasm of colon, unspecified: Secondary | ICD-10-CM

## 2014-01-12 DIAGNOSIS — C772 Secondary and unspecified malignant neoplasm of intra-abdominal lymph nodes: Secondary | ICD-10-CM | POA: Diagnosis present

## 2014-01-12 DIAGNOSIS — Z932 Ileostomy status: Secondary | ICD-10-CM

## 2014-01-12 DIAGNOSIS — Z01812 Encounter for preprocedural laboratory examination: Secondary | ICD-10-CM

## 2014-01-12 DIAGNOSIS — R630 Anorexia: Secondary | ICD-10-CM | POA: Diagnosis present

## 2014-01-12 DIAGNOSIS — Z6826 Body mass index (BMI) 26.0-26.9, adult: Secondary | ICD-10-CM

## 2014-01-12 DIAGNOSIS — C20 Malignant neoplasm of rectum: Principal | ICD-10-CM | POA: Diagnosis present

## 2014-01-12 DIAGNOSIS — Q438 Other specified congenital malformations of intestine: Secondary | ICD-10-CM

## 2014-01-12 DIAGNOSIS — Z8249 Family history of ischemic heart disease and other diseases of the circulatory system: Secondary | ICD-10-CM

## 2014-01-12 DIAGNOSIS — Z9221 Personal history of antineoplastic chemotherapy: Secondary | ICD-10-CM

## 2014-01-12 HISTORY — PX: ILEO LOOP DIVERSION: SHX1780

## 2014-01-12 HISTORY — PX: LAPAROSCOPIC LOW ANTERIOR RESECTION: SHX5904

## 2014-01-12 LAB — TYPE AND SCREEN
ABO/RH(D): AB NEG
ANTIBODY SCREEN: NEGATIVE

## 2014-01-12 SURGERY — RESECTION, RECTUM, LOW ANTERIOR, LAPAROSCOPIC
Anesthesia: General | Site: Abdomen

## 2014-01-12 MED ORDER — PROPOFOL 10 MG/ML IV BOLUS
INTRAVENOUS | Status: DC | PRN
  Administered 2014-01-12: 50 mg via INTRAVENOUS
  Administered 2014-01-12: 160 mg via INTRAVENOUS
  Administered 2014-01-12: 20 mg via INTRAVENOUS

## 2014-01-12 MED ORDER — BUPIVACAINE-EPINEPHRINE 0.25% -1:200000 IJ SOLN
INTRAMUSCULAR | Status: AC
Start: 2014-01-12 — End: 2014-01-12
  Filled 2014-01-12: qty 1

## 2014-01-12 MED ORDER — GLYCOPYRROLATE 0.2 MG/ML IJ SOLN
INTRAMUSCULAR | Status: AC
Start: 2014-01-12 — End: 2014-01-12
  Filled 2014-01-12: qty 1

## 2014-01-12 MED ORDER — ONDANSETRON HCL 4 MG/2ML IJ SOLN
INTRAMUSCULAR | Status: AC
  Filled 2014-01-12: qty 2

## 2014-01-12 MED ORDER — HYDROMORPHONE HCL PF 1 MG/ML IJ SOLN: 0.2500 mg | INTRAMUSCULAR | Status: DC | PRN

## 2014-01-12 MED ORDER — GLYCOPYRROLATE 0.2 MG/ML IJ SOLN
INTRAMUSCULAR | Status: AC
  Filled 2014-01-12: qty 1

## 2014-01-12 MED ORDER — HYDROMORPHONE HCL PF 2 MG/ML IJ SOLN
INTRAMUSCULAR | Status: AC
  Filled 2014-01-12: qty 1

## 2014-01-12 MED ORDER — BUPIVACAINE-EPINEPHRINE 0.25% -1:200000 IJ SOLN
INTRAMUSCULAR | Status: DC | PRN
  Administered 2014-01-12: 20 mL

## 2014-01-12 MED ORDER — DIPHENHYDRAMINE HCL 12.5 MG/5ML PO ELIX: 12.5000 mg | ORAL_SOLUTION | Freq: Four times a day (QID) | ORAL | Status: DC | PRN

## 2014-01-12 MED ORDER — ACETAMINOPHEN 10 MG/ML IV SOLN
1000.0000 mg | Freq: Once | INTRAVENOUS | Status: DC
  Filled 2014-01-12: qty 100

## 2014-01-12 MED ORDER — GLYCOPYRROLATE 0.2 MG/ML IJ SOLN
INTRAMUSCULAR | Status: DC | PRN
  Administered 2014-01-12: 0.6 mg via INTRAVENOUS

## 2014-01-12 MED ORDER — SODIUM CHLORIDE 0.9 % IJ SOLN
INTRAMUSCULAR | Status: AC
Start: 2014-01-12 — End: 2014-01-12
  Filled 2014-01-12: qty 20

## 2014-01-12 MED ORDER — ALVIMOPAN 12 MG PO CAPS
12.0000 mg | ORAL_CAPSULE | Freq: Once | ORAL | Status: AC
Start: 2014-01-12 — End: 2014-01-12
  Administered 2014-01-12: 12 mg via ORAL
  Filled 2014-01-12: qty 1

## 2014-01-12 MED ORDER — DEXAMETHASONE SODIUM PHOSPHATE 10 MG/ML IJ SOLN
INTRAMUSCULAR | Status: DC | PRN
  Administered 2014-01-12: 10 mg via INTRAVENOUS

## 2014-01-12 MED ORDER — KETAMINE HCL 10 MG/ML IJ SOLN
INTRAMUSCULAR | Status: AC
  Filled 2014-01-12: qty 1

## 2014-01-12 MED ORDER — KETOROLAC TROMETHAMINE 30 MG/ML IJ SOLN: 15.0000 mg | Freq: Once | INTRAMUSCULAR | Status: DC | PRN

## 2014-01-12 MED ORDER — ROCURONIUM BROMIDE 100 MG/10ML IV SOLN
INTRAVENOUS | Status: AC
  Filled 2014-01-12: qty 1

## 2014-01-12 MED ORDER — LIDOCAINE HCL (CARDIAC) 20 MG/ML IV SOLN
INTRAVENOUS | Status: DC | PRN
  Administered 2014-01-12: 50 mg via INTRAVENOUS

## 2014-01-12 MED ORDER — CEFOTETAN DISODIUM 2 G IJ SOLR
2.0000 g | INTRAMUSCULAR | Status: AC
Start: 2014-01-12 — End: 2014-01-12
  Administered 2014-01-12: 2 g via INTRAVENOUS
  Filled 2014-01-12: qty 2

## 2014-01-12 MED ORDER — LACTATED RINGERS IV SOLN
INTRAVENOUS | Status: DC | PRN
  Administered 2014-01-12: 07:00:00 via INTRAVENOUS

## 2014-01-12 MED ORDER — MIDAZOLAM HCL 5 MG/5ML IJ SOLN
INTRAMUSCULAR | Status: DC | PRN
  Administered 2014-01-12 (×2): .5 mg via INTRAVENOUS
  Administered 2014-01-12: 2 mg via INTRAVENOUS

## 2014-01-12 MED ORDER — SODIUM CHLORIDE 0.9 % IJ SOLN: 9.0000 mL | INTRAMUSCULAR | Status: DC | PRN

## 2014-01-12 MED ORDER — SODIUM CHLORIDE 0.9 % IJ SOLN
INTRAMUSCULAR | Status: AC
  Filled 2014-01-12: qty 10

## 2014-01-12 MED ORDER — ACETAMINOPHEN 500 MG PO TABS
1000.0000 mg | ORAL_TABLET | Freq: Four times a day (QID) | ORAL | Status: AC
  Administered 2014-01-12 – 2014-01-13 (×4): 1000 mg via ORAL
  Filled 2014-01-12 (×4): qty 2

## 2014-01-12 MED ORDER — MIDAZOLAM HCL 2 MG/2ML IJ SOLN
INTRAMUSCULAR | Status: AC
  Filled 2014-01-12: qty 2

## 2014-01-12 MED ORDER — KETAMINE HCL 10 MG/ML IJ SOLN
INTRAMUSCULAR | Status: DC | PRN
  Administered 2014-01-12 (×2): 20 mg via INTRAVENOUS

## 2014-01-12 MED ORDER — ONDANSETRON HCL 4 MG/2ML IJ SOLN
INTRAMUSCULAR | Status: DC | PRN
  Administered 2014-01-12: 4 mg via INTRAVENOUS

## 2014-01-12 MED ORDER — DEXAMETHASONE SODIUM PHOSPHATE 10 MG/ML IJ SOLN
INTRAMUSCULAR | Status: AC
  Filled 2014-01-12: qty 1

## 2014-01-12 MED ORDER — PROMETHAZINE HCL 25 MG/ML IJ SOLN: 6.2500 mg | INTRAMUSCULAR | Status: DC | PRN

## 2014-01-12 MED ORDER — ONDANSETRON HCL 4 MG/2ML IJ SOLN: 4.0000 mg | Freq: Four times a day (QID) | INTRAMUSCULAR | Status: DC | PRN

## 2014-01-12 MED ORDER — 0.9 % SODIUM CHLORIDE (POUR BTL) OPTIME
TOPICAL | Status: DC | PRN
  Administered 2014-01-12: 1000 mL

## 2014-01-12 MED ORDER — OXYCODONE-ACETAMINOPHEN 5-325 MG PO TABS
1.0000 | ORAL_TABLET | ORAL | Status: DC | PRN
  Administered 2014-01-15 – 2014-01-16 (×3): 1 via ORAL
  Administered 2014-01-17: 2 via ORAL
  Administered 2014-01-17: 1 via ORAL
  Filled 2014-01-12 (×5): qty 1
  Filled 2014-01-12: qty 2

## 2014-01-12 MED ORDER — NALOXONE HCL 0.4 MG/ML IJ SOLN: 0.4000 mg | INTRAMUSCULAR | Status: DC | PRN

## 2014-01-12 MED ORDER — ACETAMINOPHEN 10 MG/ML IV SOLN
INTRAVENOUS | Status: DC | PRN
  Administered 2014-01-12: 1000 mg via INTRAVENOUS

## 2014-01-12 MED ORDER — MORPHINE SULFATE (PF) 1 MG/ML IV SOLN
INTRAVENOUS | Status: AC
  Filled 2014-01-12: qty 25

## 2014-01-12 MED ORDER — MIDAZOLAM HCL 2 MG/2ML IJ SOLN
INTRAMUSCULAR | Status: AC
Start: 2014-01-12 — End: 2014-01-12
  Filled 2014-01-12: qty 2

## 2014-01-12 MED ORDER — NEOSTIGMINE METHYLSULFATE 1 MG/ML IJ SOLN
INTRAMUSCULAR | Status: DC | PRN
  Administered 2014-01-12: 4 mg via INTRAVENOUS

## 2014-01-12 MED ORDER — METOCLOPRAMIDE HCL 5 MG/ML IJ SOLN
INTRAMUSCULAR | Status: AC
Start: 2014-01-12 — End: 2014-01-12
  Filled 2014-01-12: qty 2

## 2014-01-12 MED ORDER — MORPHINE SULFATE (PF) 1 MG/ML IV SOLN
INTRAVENOUS | Status: DC
  Administered 2014-01-12: 1.5 mg via INTRAVENOUS
  Administered 2014-01-12: 1 mg via INTRAVENOUS
  Administered 2014-01-13 – 2014-01-14 (×7): 1.5 mg via INTRAVENOUS

## 2014-01-12 MED ORDER — LACTATED RINGERS IV SOLN
INTRAVENOUS | Status: DC | PRN
  Administered 2014-01-12 (×4): via INTRAVENOUS

## 2014-01-12 MED ORDER — METOCLOPRAMIDE HCL 5 MG/ML IJ SOLN
INTRAMUSCULAR | Status: DC | PRN
  Administered 2014-01-12: 10 mg via INTRAVENOUS

## 2014-01-12 MED ORDER — ROCURONIUM BROMIDE 100 MG/10ML IV SOLN
INTRAVENOUS | Status: AC
Start: 2014-01-12 — End: 2014-01-12
  Filled 2014-01-12: qty 1

## 2014-01-12 MED ORDER — SUFENTANIL CITRATE 50 MCG/ML IV SOLN
INTRAVENOUS | Status: AC
  Filled 2014-01-12: qty 1

## 2014-01-12 MED ORDER — LIDOCAINE HCL (CARDIAC) 20 MG/ML IV SOLN
INTRAVENOUS | Status: AC
  Filled 2014-01-12: qty 5

## 2014-01-12 MED ORDER — HEPARIN SODIUM (PORCINE) 5000 UNIT/ML IJ SOLN
5000.0000 [IU] | Freq: Once | INTRAMUSCULAR | Status: AC
Start: 2014-01-12 — End: 2014-01-12
  Administered 2014-01-12: 5000 [IU] via SUBCUTANEOUS
  Filled 2014-01-12: qty 1

## 2014-01-12 MED ORDER — PROPOFOL 10 MG/ML IV BOLUS
INTRAVENOUS | Status: AC
  Filled 2014-01-12: qty 20

## 2014-01-12 MED ORDER — ROCURONIUM BROMIDE 100 MG/10ML IV SOLN
INTRAVENOUS | Status: DC | PRN
  Administered 2014-01-12: 20 mg via INTRAVENOUS
  Administered 2014-01-12: 10 mg via INTRAVENOUS
  Administered 2014-01-12: 5 mg via INTRAVENOUS
  Administered 2014-01-12: 45 mg via INTRAVENOUS

## 2014-01-12 MED ORDER — SUCCINYLCHOLINE CHLORIDE 20 MG/ML IJ SOLN
INTRAMUSCULAR | Status: DC | PRN
  Administered 2014-01-12: 100 mg via INTRAVENOUS

## 2014-01-12 MED ORDER — NEOSTIGMINE METHYLSULFATE 1 MG/ML IJ SOLN
INTRAMUSCULAR | Status: AC
  Filled 2014-01-12: qty 10

## 2014-01-12 MED ORDER — SUFENTANIL CITRATE 50 MCG/ML IV SOLN
INTRAVENOUS | Status: AC
Start: 2014-01-12 — End: 2014-01-12
  Filled 2014-01-12: qty 1

## 2014-01-12 MED ORDER — LACTATED RINGERS IV SOLN
INTRAVENOUS | Status: DC
  Administered 2014-01-12 (×2): via INTRAVENOUS
  Administered 2014-01-13: 100 mL via INTRAVENOUS

## 2014-01-12 MED ORDER — DEXTROSE 5 % IV SOLN
2.0000 g | Freq: Two times a day (BID) | INTRAVENOUS | Status: AC
  Administered 2014-01-12: 2 g via INTRAVENOUS
  Filled 2014-01-12: qty 2

## 2014-01-12 MED ORDER — SUFENTANIL CITRATE 50 MCG/ML IV SOLN
INTRAVENOUS | Status: DC | PRN
  Administered 2014-01-12 (×3): 10 ug via INTRAVENOUS
  Administered 2014-01-12: 15 ug via INTRAVENOUS
  Administered 2014-01-12: 10 ug via INTRAVENOUS
  Administered 2014-01-12: 5 ug via INTRAVENOUS
  Administered 2014-01-12: 15 ug via INTRAVENOUS
  Administered 2014-01-12: 10 ug via INTRAVENOUS
  Administered 2014-01-12 (×2): 15 ug via INTRAVENOUS

## 2014-01-12 MED ORDER — DIPHENHYDRAMINE HCL 50 MG/ML IJ SOLN: 12.5000 mg | Freq: Four times a day (QID) | INTRAMUSCULAR | Status: DC | PRN

## 2014-01-12 MED ORDER — ENOXAPARIN SODIUM 40 MG/0.4ML ~~LOC~~ SOLN
40.0000 mg | SUBCUTANEOUS | Status: DC
  Administered 2014-01-13 – 2014-01-17 (×5): 40 mg via SUBCUTANEOUS
  Filled 2014-01-12 (×7): qty 0.4

## 2014-01-12 MED ORDER — KETOROLAC TROMETHAMINE 15 MG/ML IJ SOLN
15.0000 mg | Freq: Four times a day (QID) | INTRAMUSCULAR | Status: DC
  Administered 2014-01-12 – 2014-01-15 (×11): 15 mg via INTRAVENOUS
  Filled 2014-01-12 (×12): qty 1

## 2014-01-12 MED ORDER — LACTATED RINGERS IR SOLN
Status: DC | PRN
  Administered 2014-01-12: 1000 mL

## 2014-01-12 MED ORDER — ALVIMOPAN 12 MG PO CAPS
12.0000 mg | ORAL_CAPSULE | Freq: Two times a day (BID) | ORAL | Status: DC
  Administered 2014-01-13 – 2014-01-14 (×4): 12 mg via ORAL
  Filled 2014-01-12 (×6): qty 1

## 2014-01-12 MED ORDER — SODIUM CHLORIDE 0.9 % IJ SOLN
INTRAMUSCULAR | Status: AC
  Filled 2014-01-12: qty 3

## 2014-01-12 SURGICAL SUPPLY — 79 items
APPLIER CLIP 5 13 M/L LIGAMAX5 (MISCELLANEOUS)
BLADE EXTENDED COATED 6.5IN (ELECTRODE) ×4 IMPLANT
BLADE HEX COATED 2.75 (ELECTRODE) ×8 IMPLANT
BLADE SURG SZ10 CARB STEEL (BLADE) ×4 IMPLANT
CABLE HIGH FREQUENCY MONO STRZ (ELECTRODE) ×4 IMPLANT
CELLS DAT CNTRL 66122 CELL SVR (MISCELLANEOUS) ×2 IMPLANT
CHLORAPREP W/TINT 26ML (MISCELLANEOUS) ×4 IMPLANT
CLIP APPLIE 5 13 M/L LIGAMAX5 (MISCELLANEOUS) IMPLANT
COUNTER NEEDLE 20 DBL MAG RED (NEEDLE) ×4 IMPLANT
COVER MAYO STAND STRL (DRAPES) ×8 IMPLANT
DECANTER SPIKE VIAL GLASS SM (MISCELLANEOUS) ×4 IMPLANT
DRAIN CHANNEL 19F RND (DRAIN) ×4 IMPLANT
DRAPE LAPAROSCOPIC ABDOMINAL (DRAPES) ×4 IMPLANT
DRAPE LG THREE QUARTER DISP (DRAPES) ×8 IMPLANT
DRAPE UTILITY XL STRL (DRAPES) ×8 IMPLANT
DRAPE WARM FLUID 44X44 (DRAPE) ×4 IMPLANT
DRSG OPSITE POSTOP 4X10 (GAUZE/BANDAGES/DRESSINGS) IMPLANT
DRSG OPSITE POSTOP 4X6 (GAUZE/BANDAGES/DRESSINGS) IMPLANT
DRSG OPSITE POSTOP 4X8 (GAUZE/BANDAGES/DRESSINGS) IMPLANT
ELECT REM PT RETURN 9FT ADLT (ELECTROSURGICAL) ×4
ELECTRODE REM PT RTRN 9FT ADLT (ELECTROSURGICAL) ×2 IMPLANT
EVACUATOR 1/8 PVC DRAIN (DRAIN) ×8 IMPLANT
EVACUATOR SILICONE 100CC (DRAIN) IMPLANT
GLOVE BIO SURGEON STRL SZ 6.5 (GLOVE) ×9 IMPLANT
GLOVE BIO SURGEONS STRL SZ 6.5 (GLOVE) ×3
GLOVE BIOGEL PI IND STRL 7.0 (GLOVE) ×14 IMPLANT
GLOVE BIOGEL PI INDICATOR 7.0 (GLOVE) ×14
GOWN SPEC L3 XXLG W/TWL (GOWN DISPOSABLE) ×8 IMPLANT
GOWN STRL REUS W/TWL XL LVL3 (GOWN DISPOSABLE) ×40 IMPLANT
KIT BASIN OR (CUSTOM PROCEDURE TRAY) ×4 IMPLANT
LEGGING LITHOTOMY PAIR STRL (DRAPES) ×4 IMPLANT
LIGASURE IMPACT 36 18CM CVD LR (INSTRUMENTS) IMPLANT
PACK GENERAL/GYN (CUSTOM PROCEDURE TRAY) IMPLANT
PENCIL BUTTON HOLSTER BLD 10FT (ELECTRODE) ×8 IMPLANT
RETRACTOR LONE STAR DISPOSABLE (INSTRUMENTS) IMPLANT
RETRACTOR STAY HOOK 5MM (MISCELLANEOUS) IMPLANT
RTRCTR WOUND ALEXIS 18CM MED (MISCELLANEOUS) ×4
SCISSORS LAP 5X35 DISP (ENDOMECHANICALS) ×4 IMPLANT
SEALER TISSUE G2 CVD JAW 35 (ENDOMECHANICALS) IMPLANT
SEALER TISSUE G2 CVD JAW 45CM (ENDOMECHANICALS)
SEALER TISSUE G2 STRG ARTC 35C (ENDOMECHANICALS) ×8 IMPLANT
SET IRRIG TUBING LAPAROSCOPIC (IRRIGATION / IRRIGATOR) ×4 IMPLANT
SLEEVE SURGEON STRL (DRAPES) ×8 IMPLANT
SLEEVE XCEL OPT CAN 5 100 (ENDOMECHANICALS) ×12 IMPLANT
SOLUTION ANTI FOG 6CC (MISCELLANEOUS) ×4 IMPLANT
SPONGE GAUZE 4X4 12PLY (GAUZE/BANDAGES/DRESSINGS) ×4 IMPLANT
SPONGE LAP 18X18 X RAY DECT (DISPOSABLE) ×8 IMPLANT
STAPLER CIRC CVD 29MM 37CM (STAPLE) ×4 IMPLANT
STAPLER CUT CVD 40MM BLUE (STAPLE) ×4 IMPLANT
STAPLER CUT RELOAD BLUE (STAPLE) ×4 IMPLANT
STAPLER VISISTAT 35W (STAPLE) ×4 IMPLANT
SUCTION POOLE TIP (SUCTIONS) ×4 IMPLANT
SUT ETHILON 2 0 PS N (SUTURE) ×4 IMPLANT
SUT NOVA NAB DX-16 0-1 5-0 T12 (SUTURE) IMPLANT
SUT PDS AB 1 CTX 36 (SUTURE) ×8 IMPLANT
SUT PDS AB 1 TP1 96 (SUTURE) IMPLANT
SUT PROLENE 2 0 KS (SUTURE) ×4 IMPLANT
SUT SILK 2 0 (SUTURE) ×2
SUT SILK 2 0 SH CR/8 (SUTURE) ×4 IMPLANT
SUT SILK 2-0 18XBRD TIE 12 (SUTURE) ×2 IMPLANT
SUT SILK 3 0 (SUTURE) ×2
SUT SILK 3 0 SH CR/8 (SUTURE) ×4 IMPLANT
SUT SILK 3-0 18XBRD TIE 12 (SUTURE) ×2 IMPLANT
SUT VIC AB 2-0 CT1 27 (SUTURE) ×2
SUT VIC AB 2-0 CT1 TAPERPNT 27 (SUTURE) ×2 IMPLANT
SUT VIC AB 2-0 SH 18 (SUTURE) ×8 IMPLANT
SUT VICRYL 2 0 18  UND BR (SUTURE)
SUT VICRYL 2 0 18 UND BR (SUTURE) IMPLANT
SYR BULB IRRIGATION 50ML (SYRINGE) ×4 IMPLANT
SYS LAPSCP GELPORT 120MM (MISCELLANEOUS)
SYSTEM LAPSCP GELPORT 120MM (MISCELLANEOUS) IMPLANT
TOWEL OR 17X26 10 PK STRL BLUE (TOWEL DISPOSABLE) ×8 IMPLANT
TOWEL OR NON WOVEN STRL DISP B (DISPOSABLE) ×8 IMPLANT
TRAY FOLEY CATH 14FRSI W/METER (CATHETERS) ×4 IMPLANT
TRAY LAP CHOLE (CUSTOM PROCEDURE TRAY) ×4 IMPLANT
TROCAR BLADELESS OPT 5 100 (ENDOMECHANICALS) ×4 IMPLANT
TROCAR XCEL BLUNT TIP 100MML (ENDOMECHANICALS) ×4 IMPLANT
TUBING FILTER THERMOFLATOR (ELECTROSURGICAL) ×4 IMPLANT
YANKAUER SUCT BULB TIP 10FT TU (MISCELLANEOUS) ×8 IMPLANT

## 2014-01-12 NOTE — Anesthesia Preprocedure Evaluation (Signed)
Anesthesia Evaluation  Patient identified by MRN, date of birth, ID band Patient awake    Reviewed: Allergy & Precautions, H&P , NPO status , Patient's Chart, lab work & pertinent test results  Airway Mallampati: II TM Distance: >3 FB Neck ROM: Full    Dental no notable dental hx.    Pulmonary neg pulmonary ROS,  breath sounds clear to auscultation  Pulmonary exam normal       Cardiovascular negative cardio ROS  Rhythm:Regular Rate:Normal     Neuro/Psych negative neurological ROS  negative psych ROS   GI/Hepatic negative GI ROS, Neg liver ROS,   Endo/Other  negative endocrine ROS  Renal/GU negative Renal ROS  negative genitourinary   Musculoskeletal negative musculoskeletal ROS (+)   Abdominal   Peds negative pediatric ROS (+)  Hematology  (+) anemia ,   Anesthesia Other Findings   Reproductive/Obstetrics negative OB ROS                           Anesthesia Physical Anesthesia Plan  ASA: II  Anesthesia Plan: General   Post-op Pain Management:    Induction: Intravenous  Airway Management Planned: Oral ETT  Additional Equipment:   Intra-op Plan:   Post-operative Plan: Extubation in OR  Informed Consent: I have reviewed the patients History and Physical, chart, labs and discussed the procedure including the risks, benefits and alternatives for the proposed anesthesia with the patient or authorized representative who has indicated his/her understanding and acceptance.   Dental advisory given  Plan Discussed with: CRNA and Surgeon  Anesthesia Plan Comments:         Anesthesia Quick Evaluation  

## 2014-01-12 NOTE — H&P (View-Only) (Signed)
Chief Complaint  Patient presents with  . Rectal Cancer    HISTORY: Victoria [name] is a 63 y.o. female who presents to the office with rectal cancer.  This was found by Dr. Wynetta Emery during a screening colonoscopy on 09/19/2013.  At approximately 10 cm from the anal verge a mass was noted and biopsied.  A biopsy confirmed adenocarcinoma. She was referred for CTs of the chest, abdomen, and pelvis on 09/29/2013. The CT the chest revealed a 7 mm nodule at the right lung apex. No other nodules. No focal hepatic lesion. Circumferential thickening of the rectum over a 5 cm segment beginning approximately 3 cm from the anal verge. A 5 mm presacral lymph node was noted superior to the rectal thickening. No additional pelvic lymphadenopathy.  She was referred to Dr. Paulita Fujita and underwent an endoscopic ultrasound on 09/27/2013. A mass was noted at the left posterior lateral rectal wall from 8 cm to 13 cm from the anal verge. The mass was ulcerated and friable. Multiple regions of the tumor were noted to penetrate through the muscularis propria. A few malignant appearing perirectal nodes were seen. The tumor was staged as a uT3N1 lesion.  She was referred to Dr. Zella Richer. A mass was palpated 7.5 cm from the anal verge. There was a prominent left inguinal lymph node.  She has now completed neoadjuvant therapy and has been referred to me for surgery.  She denies any changes in bowel habits but does continue to loose weight due to lack of appetite. She denies any blood per rectum.  She denies any chest pain or dyspnea on exertion.   Past Medical History  Diagnosis Date  . Medical history non-contributory   . HDL deficiency   . Allergy     seasonal allergic rhinitis  . Cancer 09/19/13    rectum      Past Surgical History  Procedure Laterality Date  . Left leg surgery  1986    rod from hip to knee  . Colonoscopy with propofol N/A 09/19/2013    Procedure: COLONOSCOPY WITH PROPOFOL;  Surgeon: Garlan Fair, MD;  Location: [facility];  Service: Endoscopy;  Laterality: N/A;  . Femur fracture surgery Left 1985    steel rod  . Eus N/A 09/27/2013    Procedure: LOWER ENDOSCOPIC ULTRASOUND (EUS);  Surgeon: Arta Silence, MD;  Location: [facility];  Service: Endoscopy;  Laterality: N/A;      Current Outpatient Prescriptions  Medication Sig Dispense Refill  . ASA-APAP-Caff Buffered [id number] MG TABS Take 1 tablet by mouth as needed.      . capecitabine (XELODA) 500 MG tablet Take 3 tablets (1,500 mg total) by mouth 2 (two) times daily after a meal. Take on days of radiation only (M-F) Total daily dose = 3000mg   120 tablet  0  . prochlorperazine (COMPAZINE) 10 MG tablet Take 1 tablet (10 mg total) by mouth every 6 (six) hours as needed for nausea or vomiting.  30 tablet  0  . psyllium (METAMUCIL) 58.6 % powder Take by mouth daily. 1 tablespoon daily       No current facility-administered medications for this visit.      Allergies  Allergen Reactions  . Ointment Base [Lanolin-Petrolatum] Rash    Specifically burn ointments.      Family History  Problem Relation Age of Onset  . Hypertension Mother   . Diabetes Father   . Heart disease Father     CAD      History  Social History  . Marital Status: Married    Spouse Name: Jeneen Rinks    Number of Children: 0  . Years of Education: N/A   Occupational History  .      [employer]   Social History Main Topics  . Smoking status: Never Smoker   . Smokeless tobacco: Never Used  . Alcohol Use: Yes     Comment: occasional wine  . Drug Use: No  . Sexual Activity: Not Currently   Other Topics Concern  . 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   No children       REVIEW OF SYSTEMS - PERTINENT POSITIVES ONLY: Review of Systems - General ROS: negative for - chills or fever Hematological and Lymphatic ROS: negative for - bleeding problems, blood  clots or blood transfusions Respiratory ROS: no cough, shortness of breath, or wheezing Cardiovascular ROS: no chest pain or dyspnea on exertion Gastrointestinal ROS: no abdominal pain, change in bowel habits, or black or bloody stools Genito-Urinary ROS: no dysuria, trouble voiding, or hematuria  EXAM: Filed Vitals:   12/20/13 1028  BP: 118/82  Pulse: 90  Temp: 98.1 F (36.7 C)  Resp: 24    Gen:  No acute distress.  Well nourished and well groomed.   Neurological: Alert and oriented to person, place, and time. Coordination normal.  Head: Normocephalic and atraumatic.  Eyes: Conjunctivae are normal. Pupils are equal, round, and reactive to light. No scleral icterus.  Neck: Normal range of motion. Neck supple. No tracheal deviation or thyromegaly present.  No cervical lymphadenopathy. Cardiovascular: Normal rate, regular rhythm, normal heart sounds and intact distal pulses.   Respiratory: Effort normal.  No respiratory distress. No chest wall tenderness. Breath sounds normal.  No wheezes, rales or rhonchi.  GI: Soft. Bowel sounds are normal. The abdomen is soft and nontender.  There is no rebound and no guarding.  Slight fullness in L abd Musculoskeletal: Normal range of motion. Extremities are nontender.  Skin: Skin is warm and dry. No rash noted. No diaphoresis. No erythema. No pallor. No clubbing, cyanosis, or edema.   Psychiatric: Normal mood and affect. Behavior is normal. Judgment and thought content normal.   Anorectal Exam: no palpable mass noted   LABORATORY RESULTS: Available labs are reviewed  Lab Results  Component Value Date   WBC 4.2 11/20/2013   HGB 11.0* 11/20/2013   HCT 34.4* 11/20/2013   MCV 84.6 11/20/2013   PLT 246 11/20/2013    Lab Results  Component Value Date   CEA 3.8 10/02/2013   Last Albumin: 3.4 on 12/29  RADIOLOGY RESULTS:   Images and reports are reviewed. CT CHEST ABD AND PELVIS IMPRESSION:  1. Circumferential rectal thickening over 5 cm  segment consists with primary carcinoma.  2. Small 5 mm presacral lymph node is concerning for local metastasis.  3. No evidence of or distant iliac nodal metastasis or retroperitoneal metastasis  4. Single right upper lobe pulmonary nodule measuring 7 mm is indeterminate. Recommend either attention on followup versus further evaluation with FDG PET/CT scan.     ASSESSMENT AND PLAN: Victoria [name] Is a 63 year old female in relatively good health who presents to the office with a advanced rectal cancer. She is status post neoadjuvant chemotherapy and radiation and is here today to discuss surgery. She continues to have trouble with appetite and losing weight. She reports no  other symptoms.  On exam I did not palpate her tumor digitally. I would like to perform a surgery in another 3-5 weeks. This will was likely be a low anterior resection with diverting ileostomy. I will repeat her laboratory today to evaluate her nutrition given her lack of appetite and poor eating habits. I will also repeat a CEA and her CT scans to make sure that there is not massive progression of disease causing her weight loss.  The surgery and anatomy were described to the patient as well as the risks of surgery and the possible complications. These include: Bleeding, infection and possible wound complications such as hernia, damage to adjacent structures, leak of surgical connections, which can lead to other surgeries and possibly an ostomy (5-7%), possible need for other procedures, such as abscess drains in radiology, possible prolonged hospital stay, possible diarrhea from removal of part of the colon, possible constipation from narcotics, prolonged fatigue/weakness or appetite loss, possible early recurrence of cancer, possible complications of their medical problems such as heart disease or arrhythmias or lung problems, death (less than 1%). I believe the patient understands and wishes to proceed with the  surgery.    Rosario Adie, MD Colon and Rectal Surgery / [facility]      Visit Diagnoses: 1. Rectal cancer     Primary Care Physician: Irven Shelling, MD

## 2014-01-12 NOTE — Op Note (Signed)
01/12/2014  12:10 PM  PATIENT:  Victoria [name]  63 y.o. female  Patient Care Team: Irven Shelling, MD as PCP - General (Internal Medicine)  PRE-OPERATIVE DIAGNOSIS:  Distal rectal cancer   POST-OPERATIVE DIAGNOSIS:  Distal rectal cancer   PROCEDURE: LAPAROSCOPIC LOW ANTERIOR RESECTION    LOOP ILEOSTOMY  SURGEON:  Surgeon(s): Leighton Ruff, MD Odis Hollingshead, MD  ASSISTANT: Zella Richer   ANESTHESIA:   general  EBL: 227ml Total I/O In: 3000 [I.V.:3000] Out: 625 [Urine:400; Blood:225]  DRAINS: (19) Jackson-Pratt drain(s) with closed bulb suction in the pelvis   SPECIMEN:  Source of Specimen:  rectosigmoid  DISPOSITION OF SPECIMEN:  PATHOLOGY  COUNTS:  YES  PLAN OF CARE: Admit to inpatient   PATIENT DISPOSITION:  PACU - hemodynamically stable.  INDICATION: Is a 63 year old female who was found to have a distal rectal mass on colonoscopy.  Rectal ultrasound showed a T3 N1 tumor noted 8 cm from the anal margin. She underwent neoadjuvant chemoradiation therapy. She is now here for resection. The risks and benefits of this procedure were explained to the patient prior to the OR consent was signed and placed on chart.   OR FINDINGS: large distal rectal mass approximately 5 cm from the anal verge  DESCRIPTION: the patient was identified in the preoperative holding area and taken to the OR where they were laid supine on the operating room table.  Gen. anesthesia was induced without difficulty.  She was then placed in lithotomy position and appropriately padded and secured. SCDs were also noted to be in place prior to the initiation of anesthesia.  The patient was then prepped and draped in the usual sterile fashion.    A surgical timeout was performed indicating the correct patient, procedure, positioning and need for preoperative antibiotics.   Again the procedure making an inferior midline incision just below the umbilicus. This was carried down through the  subcutaneous tissues using blunt dissection. The fascia was identified and elevated using 2 Kocher clamps. The fascia was incised using a scalpel. The peritoneum was then elevated and incised using Metzenbaum scissors. A pursestring 2-0 Vicryl suture was placed. The [facility] port was placed into the abdomen and the abdomen was insufflated approximately 15 mm mercury.  I began by inspecting the abdomen. There are no other signs of metastatic disease. Her liver appeared normal. She had a large amount of redundant colon.   I then placed a suprapubic 5 mm trocar under direct visualization. A right lower quadrant trocar was placed through the ostomy marked site. Lastly a left lower quadrant port was placed also under direct visualization.  I then placed the patient in Trendelenburg position.  I identified the inferior mesenteric artery. I opened up the peritoneal plane over this artery. I dissected underneath the superior hemorrhoidal artery until the left ureter could be visualized. This was all swept down and I isolated the artery and vein. I then separately transected these using the Enseal device. Hemostasis was good. I transected the rest of the mesentery up to the level of the colon. I then opened up the presacral plane and separated the mesorectum from this using blunt dissection and Enseal device. Dissection was carried down posteriorly to the level of the coccyx. I then dissected out laterally following the mesorectal plane. I then opened the anterior peritoneal reflection and dissected the vaginal wall from the anterior rectum. I identified the tumor, which appeared to be completely inside the mesorectum. I dissected down to the level of the  levators into the pelvis. I freed all the surrounding tissue except for the rectum.  At this point a Pfannenstiel incision was made in standard fashion. An Sabine wound protector was placed. A below contour stapler was used to transect the proximal colon at the previously  marked site. The contour stapler was then used again to transect the distal rectum approximately 2 cm distal to the lower border of the tumor. After this was completed the specimen was removed from the abdomen and sent to pathology for further examination.   The pelvis was then irrigated. Hemostasis was good. The proximal colon easily mobilized down to the anastomosis area. I dissected a bit more of the vaginal wall from the rectal stump. I then opened the distal colon staple line. There was good bleeding noted. I used the EEA sizers and determined a 29 mm EEA stapler would suffice. A pursestring device was used as well as a 2-0 Prolene suture to create a pursestring on the proximal colon. The anvil was placed. The pursestring was tied tightly. The EEA stapler was placed into the rectal stump. The spike was brought out and the anastomosis was created. We took special attention to make sure that no part of the vaginal wall was inside the staple line. We then created the anastomosis. The proximal colon was clamped and irrigation was placed into the pelvis. The rectum was insufflated. There was no signs of leak. The anastomosis was noted to be approximately 3 cm from the anal verge.  The abdomen was then irrigated. A 19 [place] Blake drain was placed into the pelvis and secured in the left lower quadrant port site. I then made and defect in the abdominal wall at the previously marked ileostomy site. The fascia was incised in a cruciate manner. The rectus was split and the peritoneum was incised using electrocautery. I brought up a loop of terminal ileum and secured this in the ileostomy site.  We then switched to clean drapes instruments gowns and gloves.  The wound protector was removed. The umbilical incision was closed. The peritoneum of the Pfannenstiel incision was closed using a running 2-0 Vicryl suture. The fascia was closed using 2 #1 PDS sutures. The subcutaneous tissue was closed using interrupted 2-0  Vicryl sutures. The skin was closed using a 4-0 running Vicryl suture. The umbilical incision was closed using a 2-0 Vicryl suture and 4-0 Vicryl suture for the skin. Demerol was placed on the skin. A sterile dressing was placed over the Pfannenstiel incision.  The loop ileostomy was then matured in standard Orient fashion. This was done using 2-0 Vicryl sutures.  An ostomy appliance was placed. The patient was then awakened from anesthesia and sent to the postanesthesia care unit in stable condition. All counts were correct per operating room staff.

## 2014-01-12 NOTE — Anesthesia Postprocedure Evaluation (Signed)
  Anesthesia Post-op Note  Patient: Victoria [name]  Procedure(s) Performed: Procedure(s) (LRB): LAPAROSCOPIC LOW ANTERIOR RESECTION  (N/A)   LOOP ILEOSTOMY (N/A)  Patient Location: PACU  Anesthesia Type: General  Level of Consciousness: awake and alert   Airway and Oxygen Therapy: Patient Spontanous Breathing  Post-op Pain: mild  Post-op Assessment: Post-op Vital signs reviewed, Patient's Cardiovascular Status Stable, Respiratory Function Stable, Patent Airway and No signs of Nausea or vomiting  Last Vitals:  Filed Vitals:   01/12/14 1230  BP: 140/78  Pulse: 75  Temp:   Resp: 25    Post-op Vital Signs: stable   Complications: No apparent anesthesia complications

## 2014-01-12 NOTE — Interval H&P Note (Signed)
History and Physical Interval Note:  01/12/2014 7:23 AM  Victoria [name]  has presented today for surgery, with the diagnosis of rectal cancer   The various methods of treatment have been discussed with the patient and family. After consideration of risks, benefits and other options for treatment, the patient has consented to  Procedure(s): LAPAROSCOPIC LOW ANTERIOR RESECTION  (N/A) POSSIBLE  LOOP ILEOSTOMY (N/A) as a surgical intervention .  The patient's history has been reviewed, patient examined, no change in status, stable for surgery.  I have reviewed the patient's chart and labs.  Questions were answered to the patient's satisfaction.     Rosario Adie, MD  Colorectal [facility]

## 2014-01-12 NOTE — Transfer of Care (Signed)
Immediate Anesthesia Transfer of Care Note  Patient: Victoria [name]  Procedure(s) Performed: Procedure(s): LAPAROSCOPIC LOW ANTERIOR RESECTION  (N/A)   LOOP ILEOSTOMY (N/A)  Patient Location: PACU  Anesthesia Type:General  Level of Consciousness: awake, alert , oriented and patient cooperative  Airway & Oxygen Therapy: Patient Spontanous Breathing and Patient connected to face mask oxygen  Post-op Assessment: Report given to PACU RN, Post -op Vital signs reviewed and stable and Patient moving all extremities X 4  Post vital signs: stable  Complications: No apparent anesthesia complications

## 2014-01-13 DIAGNOSIS — Z932 Ileostomy status: Secondary | ICD-10-CM

## 2014-01-13 LAB — BASIC METABOLIC PANEL
BUN: 6 mg/dL (ref 6–23)
CALCIUM: 8.5 mg/dL (ref 8.4–10.5)
CO2: 30 meq/L (ref 19–32)
Chloride: 97 mEq/L (ref 96–112)
Creatinine, Ser: 0.97 mg/dL (ref 0.50–1.10)
GFR calc non Af Amer: 62 mL/min — ABNORMAL LOW (ref >90)
GFR, EST AFRICAN AMERICAN: 72 mL/min — AB (ref >90)
Glucose, Bld: 98 mg/dL (ref 70–99)
Potassium: 3.6 mEq/L — ABNORMAL LOW (ref 3.7–5.3)
Sodium: 137 mEq/L (ref 137–147)

## 2014-01-13 LAB — CBC
HCT: 29.2 % — ABNORMAL LOW (ref 36.0–46.0)
Hemoglobin: 9.9 g/dL — ABNORMAL LOW (ref 12.0–15.0)
MCH: 28.4 pg (ref 26.0–34.0)
MCHC: 33.9 g/dL (ref 30.0–36.0)
MCV: 83.9 fL (ref 78.0–100.0)
Platelets: 281 K/uL (ref 150–400)
RBC: 3.48 MIL/uL — ABNORMAL LOW (ref 3.87–5.11)
RDW: 15.2 % (ref 11.5–15.5)
WBC: 7.2 K/uL (ref 4.0–10.5)

## 2014-01-13 MED ORDER — KCL IN DEXTROSE-NACL 20-5-0.45 MEQ/L-%-% IV SOLN
INTRAVENOUS | Status: DC
  Administered 2014-01-13: 14:00:00 via INTRAVENOUS
  Administered 2014-01-14: 20 mL/h via INTRAVENOUS
  Filled 2014-01-13 (×4): qty 1000

## 2014-01-13 NOTE — Progress Notes (Signed)
Occupational Therapy Evaluation Patient Details Name: Victoria [name] MRN: [id number] DOB: 1951-03-30 Today's Date: 01/13/2014 Time: 0865-7846 OT Time Calculation (min): 24 min  OT Assessment / Plan / Recommendation History of present illness Pt with rectal cancer and s/p LAPAROSCOPIC LOW ANTERIOR RESECTION  and loop ileostomy.   Clinical Impression   Completed education regarding compensatory techniques, use of AE, use of appropriate DME and E conservation techniques. Pt/family verbalized understanding. Rec 3 in 1 for d/C.    OT Assessment  Patient does not need any further OT services    Follow Up Recommendations  No OT follow up;Supervision/Assistance - 24 hour (initially)    Barriers to Discharge      Equipment Recommendations  3 in 1 bedside comode    Recommendations for Other Services    Frequency       Precautions / Restrictions     Pertinent Vitals/Pain stable    ADL  Lower Body Bathing: Moderate assistance Where Assessed - Lower Body Bathing: Unsupported sit to stand Lower Body Dressing: Moderate assistance Where Assessed - Lower Body Dressing: Supported sit to stand Toilet Transfer: Supervision/safety Transfers/Ambulation Related to ADLs: S ADL Comments: Pt with difficulty with LB ADL due to apinin abdomen. Demonstrated/educated pt on use of AF to reduce stress on lower abdomen and increase independence. discussed available DME, including rec of 3 in 1 for shower/toileting.    OT Diagnosis:    OT Problem List:   OT Treatment Interventions:     OT Goals(Current goals can be found in the care plan section) Acute Rehab OT Goals Patient Stated Goal: to get back to doing for myself OT Goal Formulation:  (eval only)  Visit Information  Last OT Received On: 01/13/14 Assistance Needed: +1 History of Present Illness: Pt with rectal cancer and s/p LAPAROSCOPIC LOW ANTERIOR RESECTION  and loop ileostomy.       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 Communication Communication: No difficulties         Vision/Perception Vision - History Baseline Vision: Wears glasses all the time   Cognition  Cognition Arousal/Alertness: Awake/alert Behavior During Therapy: WFL for tasks assessed/performed Overall Cognitive Status: Within Functional Limits for tasks assessed    Extremity/Trunk Assessment Upper Extremity Assessment Upper Extremity Assessment: Overall WFL for tasks assessed Lower Extremity Assessment Lower Extremity Assessment: Overall WFL for tasks assessed Cervical / Trunk Assessment Cervical / Trunk Assessment: Normal     Mobility Transfers Overall transfer level: Needs assistance Equipment used: Rolling walker (2 wheeled) Transfers: Sit to/from Stand Sit to Stand: Supervision General transfer comment: cues for technique.     Exercise     Balance Balance Overall balance assessment: No apparent balance deficits (not formally assessed)   End of Session OT - End of Session Activity Tolerance: Patient tolerated treatment well Patient left: in chair;with call bell/phone within reach;with family/visitor present Nurse Communication: Mobility status  GO     Trenton Verne,HILLARY 01/13/2014, 3:16 PM [facility], OTR/L  [phone] 01/13/2014

## 2014-01-13 NOTE — Evaluation (Signed)
Physical Therapy Evaluation Patient Details Name: Victoria [name] MRN: [id number] DOB: [birth year] Today's Date: 01/13/2014 Time: 9628-3662 PT Time Calculation (min): 33 min  PT Assessment / Plan / Recommendation History of Present Illness  Pt with rectal cancer and s/p LAPAROSCOPIC LOW ANTERIOR RESECTION  and loop ileostomy.  Clinical Impression  Pt admitted with above. Pt currently with functional limitations due to the deficits listed below (see PT Problem List).  Pt will benefit from skilled PT to increase their independence and safety with mobility to allow discharge to the venue listed below. Recommend RW for gait.  At this time do not feel pt will need any follow up PT, but this depends on progress in acute care.     PT Assessment  Patient needs continued PT services    Follow Up Recommendations  No PT follow up    Does the patient have the potential to tolerate intense rehabilitation      Barriers to Discharge        Equipment Recommendations  Rolling walker with 5" wheels    Recommendations for Other Services     Frequency Min 3X/week    Precautions / Restrictions     Pertinent Vitals/Pain Soreness in abd, but no pain      Mobility  Bed Mobility Overal bed mobility: Needs Assistance Bed Mobility: Rolling;Sidelying to Sit Rolling: Supervision Sidelying to sit: Supervision General bed mobility comments: verbal cueing for body mechanics to decrease abd strain. Transfers Overall transfer level: Needs assistance Equipment used: Rolling walker (2 wheeled) Transfers: Sit to/from Stand Sit to Stand: Supervision General transfer comment: cues for technique. Ambulation/Gait Ambulation/Gait assistance: Min guard Ambulation Distance (Feet): 250 Feet Assistive device: Rolling walker (2 wheeled) Gait Pattern/deviations: Step-through pattern General Gait Details: guarded    Exercises     PT Diagnosis: Difficulty walking  PT Problem List: Decreased activity  tolerance;Decreased balance;Decreased mobility PT Treatment Interventions: Gait training;Stair training;Functional mobility training;Therapeutic activities;Therapeutic exercise     PT Goals(Current goals can be found in the care plan section) Acute Rehab PT Goals Patient Stated Goal: To get stronger and walk. PT Goal Formulation: With patient Time For Goal Achievement: 01/27/14 Potential to Achieve Goals: Good  Visit Information  Last PT Received On: 01/13/14 Assistance Needed: +1 History of Present Illness: Pt with rectal cancer and s/p LAPAROSCOPIC LOW ANTERIOR RESECTION  and loop ileostomy.       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 Communication Communication: No difficulties    Cognition  Cognition Arousal/Alertness: Awake/alert Behavior During Therapy: WFL for tasks assessed/performed Overall Cognitive Status: Within Functional Limits for tasks assessed    Extremity/Trunk Assessment Upper Extremity Assessment Upper Extremity Assessment: Defer to OT evaluation Lower Extremity Assessment Lower Extremity Assessment: Overall WFL for tasks assessed   Balance    End of Session PT - End of Session Activity Tolerance: Patient tolerated treatment well Patient left: in chair Nurse Communication: Mobility status  GP     Merryn Thaker LUBECK 01/13/2014, 12:55 PM

## 2014-01-13 NOTE — Progress Notes (Signed)
1 Day Post-Op LAR with stapled colo-anal anastomosis and diverting loop ileostomy Subjective: Pt feeling ok.  No nausea, ileostomy with bilious output, pain controlled   Objective: Vital signs in last 24 hours: Temp:  [97.3 F (36.3 C)-98.2 F (36.8 C)] 97.3 F (36.3 C) (02/14 0515) Pulse Rate:  [66-77] 66 (02/14 0515) Resp:  [18-28] 20 (02/14 0752) BP: (102-143)/(64-88) 114/73 mmHg (02/14 0515) SpO2:  [100 %] 100 % (02/14 0752) FiO2 (%):  [43 %] 43 % (02/14 0752) Weight:  [157 lb 6.4 oz (71.396 kg)] 157 lb 6.4 oz (71.396 kg) (02/13 1730)   Intake/Output from previous day: 02/13 0701 - 02/14 0700 In: 6301 [I.V.:6301] Out: 2450 [Urine:1700; Drains:300; Stool:225; Blood:225] Intake/Output this shift:     General appearance: alert and cooperative GI: normal findings: soft, non-tender Ostomy: pink, viable and functioning JP: serosanguinous  Incision: no significant drainage  Lab Results:   Recent Labs  01/13/14 0625  WBC 7.2  HGB 9.9*  HCT 29.2*  PLT 281   BMET  Recent Labs  01/13/14 0625  NA 137  K 3.6*  CL 97  CO2 30  GLUCOSE 98  BUN 6  CREATININE 0.97  CALCIUM 8.5   PT/INR No results found for this basename: LABPROT, INR,  in the last 72 hours ABG No results found for this basename: PHART, PCO2, PO2, HCO3,  in the last 72 hours  MEDS, Scheduled . acetaminophen  1,000 mg Oral 4 times per day  . alvimopan  12 mg Oral BID  . enoxaparin (LOVENOX) injection  40 mg Subcutaneous Q24H  . ketorolac  15 mg Intravenous 4 times per day  . morphine   Intravenous 6 times per day    Studies/Results: No results found.  Assessment: s/p Procedure(s): LAPAROSCOPIC LOW ANTERIOR RESECTION    LOOP ILEOSTOMY  Expected post op course  Plan: Advance diet to full liquids Minimize IVF's Ambulate   LOS: 1 day     .Rosario Adie, [facility], [place] [phone]   01/13/2014 9:58 AM

## 2014-01-14 LAB — BASIC METABOLIC PANEL
BUN: 5 mg/dL — ABNORMAL LOW (ref 6–23)
CHLORIDE: 103 meq/L (ref 96–112)
CO2: 31 mEq/L (ref 19–32)
CREATININE: 0.85 mg/dL (ref 0.50–1.10)
Calcium: 8.5 mg/dL (ref 8.4–10.5)
GFR calc non Af Amer: 72 mL/min — ABNORMAL LOW (ref >90)
GFR, EST AFRICAN AMERICAN: 84 mL/min — AB (ref >90)
Glucose, Bld: 104 mg/dL — ABNORMAL HIGH (ref 70–99)
Potassium: 3.9 mEq/L (ref 3.7–5.3)
SODIUM: 142 meq/L (ref 137–147)

## 2014-01-14 LAB — CBC
HCT: 29 % — ABNORMAL LOW (ref 36.0–46.0)
Hemoglobin: 9.7 g/dL — ABNORMAL LOW (ref 12.0–15.0)
MCH: 28.2 pg (ref 26.0–34.0)
MCHC: 33.4 g/dL (ref 30.0–36.0)
MCV: 84.3 fL (ref 78.0–100.0)
PLATELETS: 271 10*3/uL (ref 150–400)
RBC: 3.44 MIL/uL — ABNORMAL LOW (ref 3.87–5.11)
RDW: 15.1 % (ref 11.5–15.5)
WBC: 6.8 10*3/uL (ref 4.0–10.5)

## 2014-01-14 MED ORDER — MORPHINE SULFATE 2 MG/ML IJ SOLN: 2.0000 mg | INTRAMUSCULAR | Status: DC | PRN

## 2014-01-14 NOTE — Progress Notes (Signed)
Patient ambulated approximatley  430ft with standby assist. Patient tolerated well. RN will continue to monitor patient

## 2014-01-14 NOTE — Progress Notes (Signed)
2 Days Post-Op LAR with stapled colo-anal anastomosis and diverting loop ileostomy Subjective: Pt ambulating, min pain. No nausea, ileostomy with bilious output  Objective: Vital signs in last 24 hours: Temp:  [97.5 F (36.4 C)-98.6 F (37 C)] 98.6 F (37 C) (02/15 0520) Pulse Rate:  [54-79] 79 (02/15 0520) Resp:  [12-31] 12 (02/15 0800) BP: (111-138)/(66-76) 138/76 mmHg (02/15 0520) SpO2:  [97 %-100 %] 98 % (02/15 0800)   Intake/Output from previous day: 02/14 0701 - 02/15 0700 In: 2222.5 [P.O.:600; I.V.:1622.5] Out: 3225 [Urine:1700; Drains:310; Stool:1215] Intake/Output this shift:     General appearance: alert and cooperative GI: normal findings: soft, non-tender Ostomy: pink, viable and functioning well JP: serosanguinous  Incision: no significant drainage  Lab Results:   Recent Labs  01/13/14 0625 01/14/14 0520  WBC 7.2 6.8  HGB 9.9* 9.7*  HCT 29.2* 29.0*  PLT 281 271   BMET  Recent Labs  01/13/14 0625 01/14/14 0520  NA 137 142  K 3.6* 3.9  CL 97 103  CO2 30 31  GLUCOSE 98 104*  BUN 6 5*  CREATININE 0.97 0.85  CALCIUM 8.5 8.5   PT/INR No results found for this basename: LABPROT, INR,  in the last 72 hours ABG No results found for this basename: PHART, PCO2, PO2, HCO3,  in the last 72 hours  MEDS, Scheduled . alvimopan  12 mg Oral BID  . enoxaparin (LOVENOX) injection  40 mg Subcutaneous Q24H  . ketorolac  15 mg Intravenous 4 times per day    Studies/Results: No results found.  Assessment: s/p Procedure(s): LAPAROSCOPIC LOW ANTERIOR RESECTION    LOOP ILEOSTOMY  Expected post op course  Plan: Advance diet to soft diet Minimize IVF's Ambulate Will monitor ostomy output once on reg diet D/c Foley   LOS: 2 days     .Rosario Adie, [facility], [place]   01/14/2014 10:06 AM

## 2014-01-14 NOTE — Progress Notes (Signed)
RN contacted MD on call Johney Maine).  RN reported patient with extremely tight blood bank bracelet. MD gave permission for blood bank bracelet to be cut.  RN cut bracelet off patient arm.  RN will continue to monitor patient .

## 2014-01-15 ENCOUNTER — Encounter (HOSPITAL_COMMUNITY): Payer: Self-pay | Admitting: General Surgery

## 2014-01-15 LAB — BASIC METABOLIC PANEL
BUN: 4 mg/dL — AB (ref 6–23)
CHLORIDE: 102 meq/L (ref 96–112)
CO2: 30 mEq/L (ref 19–32)
Calcium: 8.3 mg/dL — ABNORMAL LOW (ref 8.4–10.5)
Creatinine, Ser: 0.77 mg/dL (ref 0.50–1.10)
GFR calc non Af Amer: 89 mL/min — ABNORMAL LOW (ref >90)
Glucose, Bld: 99 mg/dL (ref 70–99)
Potassium: 3.3 mEq/L — ABNORMAL LOW (ref 3.7–5.3)
Sodium: 141 mEq/L (ref 137–147)

## 2014-01-15 LAB — CBC
HEMATOCRIT: 28.5 % — AB (ref 36.0–46.0)
Hemoglobin: 9.6 g/dL — ABNORMAL LOW (ref 12.0–15.0)
MCH: 28.2 pg (ref 26.0–34.0)
MCHC: 33.7 g/dL (ref 30.0–36.0)
MCV: 83.6 fL (ref 78.0–100.0)
Platelets: 294 10*3/uL (ref 150–400)
RBC: 3.41 MIL/uL — ABNORMAL LOW (ref 3.87–5.11)
RDW: 15 % (ref 11.5–15.5)
WBC: 5.8 10*3/uL (ref 4.0–10.5)

## 2014-01-15 NOTE — Care Management Note (Signed)
    Page 1 of 1   01/15/2014     10:56:17 AM   CARE MANAGEMENT NOTE 01/15/2014  Patient:  KORY, PANJWANI   Account Number:  [id number]  Date Initiated:  01/15/2014  Documentation initiated by:  Sunday Spillers  Subjective/Objective Assessment:   63 yo female admitted s/p lap LAR with ileostomy. PTA lived at home with spouse.     Action/Plan:   Home when stable   Anticipated DC Date:  01/17/2014   Anticipated DC Plan:  [place]  CM consult      [facility] Choice  HOME HEALTH   Choice offered to / List presented to:  C-1 Patient        [place] arranged  HH-1 RN      [facility].   Status of service:  Completed, signed off Medicare Important Message given?  NA - LOS <3 / Initial given by admissions (If response is "NO", the following Medicare IM given date fields will be blank) Date Medicare IM given:   Date Additional Medicare IM given:    Discharge Disposition:  [place]  Per UR Regulation:  Reviewed for med. necessity/level of care/duration of stay  If discussed at [place] of Stay Meetings, dates discussed:    Comments:

## 2014-01-15 NOTE — Progress Notes (Signed)
3 Days Post-Op LAR with stapled colo-anal anastomosis and diverting loop ileostomy Subjective: Pt ambulating, min pain. No nausea, ileostomy working  Objective: Vital signs in last 24 hours: Temp:  [97.3 F (36.3 C)-98.6 F (37 C)] 97.3 F (36.3 C) (02/16 0557) Pulse Rate:  [71-86] 79 (02/16 0557) Resp:  [12-18] 18 (02/16 0557) BP: (131-147)/(77-85) 144/85 mmHg (02/16 0557) SpO2:  [95 %-100 %] 95 % (02/16 0557)   Intake/Output from previous day: 02/15 0701 - 02/16 0700 In: 1429.5 [P.O.:840; I.V.:589.5] Out: 2085 [Urine:1400; Drains:60; Stool:625] Intake/Output this shift:   General appearance: alert and cooperative GI: normal findings: soft, non-tender Ostomy: pink, viable and functioning well JP: serosanguinous  Incision: no significant drainage  Lab Results:   Recent Labs  01/14/14 0520 01/15/14 0520  WBC 6.8 5.8  HGB 9.7* 9.6*  HCT 29.0* 28.5*  PLT 271 294   BMET  Recent Labs  01/14/14 0520 01/15/14 0520  NA 142 141  K 3.9 3.3*  CL 103 102  CO2 31 30  GLUCOSE 104* 99  BUN 5* 4*  CREATININE 0.85 0.77  CALCIUM 8.5 8.3*   PT/INR No results found for this basename: LABPROT, INR,  in the last 72 hours ABG No results found for this basename: PHART, PCO2, PO2, HCO3,  in the last 72 hours  MEDS, Scheduled . alvimopan  12 mg Oral BID  . enoxaparin (LOVENOX) injection  40 mg Subcutaneous Q24H  . ketorolac  15 mg Intravenous 4 times per day    Studies/Results: No results found.  Assessment: s/p Procedure(s): LAPAROSCOPIC LOW ANTERIOR RESECTION    LOOP ILEOSTOMY  Expected post op course  Plan: Cont soft diet Ambulate Cont to monitor ostomy output once on reg diet Ostomy teaching today   LOS: 3 days     .Rosario Adie, [facility], [place]   01/15/2014 7:31 AM

## 2014-01-15 NOTE — Progress Notes (Signed)
Physical Therapy Treatment Patient Details Name: Victoria [name] MRN: [id number] DOB: 01/31/1951 Today's Date: 01/15/2014 Time: 5852-7782 PT Time Calculation (min): 17 min  PT Assessment / Plan / Recommendation  History of Present Illness Pt with rectal cancer and s/p LAPAROSCOPIC LOW ANTERIOR RESECTION  and loop ileostomy.   PT Comments   Pt feeling better.  Amb full unit twice with no AD.  Follow Up Recommendations  No PT follow up     Does the patient have the potential to tolerate intense rehabilitation     Barriers to Discharge        Equipment Recommendations  Rolling walker with 5" wheels    Recommendations for Other Services    Frequency Min 3X/week   Progress towards PT Goals Progress towards PT goals: Progressing toward goals  Plan      Precautions / Restrictions Precautions Precautions: None Restrictions Weight Bearing Restrictions: No   Pertinent Vitals/Pain C/o ABD tightness    Mobility  Bed Mobility General bed mobility comments: Pt OOB in recliner Transfers Overall transfer level: Modified independent Equipment used: None Transfers: Sit to/from Stand Sit to Stand: Modified independent (Device/Increase time) General transfer comment: good safety tech Ambulation/Gait Ambulation Distance (Feet): 450 Feet Assistive device: None Gait velocity: decreased General Gait Details: amb w/o any AD.  Demon slow but staedy gait.      PT Goals (current goals can now be found in the care plan section)    Visit Information  Last PT Received On: 01/15/14 Assistance Needed: +1 History of Present Illness: Pt with rectal cancer and s/p LAPAROSCOPIC LOW ANTERIOR RESECTION  and loop ileostomy.    Subjective Data      Cognition       Balance     End of Session PT - End of Session Equipment Utilized During Treatment: Gait belt Activity Tolerance: Patient tolerated treatment well Patient left: in chair   Rica Koyanagi  PTA [facility]  Acute  Rehab Pager       [phone]

## 2014-01-16 NOTE — Progress Notes (Signed)
4 Days Post-Op LAR with stapled colo-anal anastomosis and diverting loop ileostomy Subjective: Pt ambulating, min pain. No nausea, ileostomy working.  Still working on taking care of ostomy by herself  Objective: Vital signs in last 24 hours: Temp:  [98.2 F (36.8 C)-98.7 F (37.1 C)] 98.3 F (36.8 C) (02/17 0630) Pulse Rate:  [66-83] 66 (02/17 0630) Resp:  [18-20] 18 (02/17 0630) BP: (133-140)/(72-80) 133/80 mmHg (02/17 0630) SpO2:  [96 %-98 %] 97 % (02/17 0630)   Intake/Output from previous day: 02/16 0701 - 02/17 0700 In: 1260 [P.O.:1260] Out: 610 [Urine:250; Drains:40; Stool:320] Intake/Output this shift: Total I/O In: 240 [P.O.:240] Out: 40 [Drains:40] General appearance: alert and cooperative GI: normal findings: soft, non-tender Ostomy: pink, viable and functioning well JP: serosanguinous  Incision: no significant drainage  Lab Results:   Recent Labs  01/14/14 0520 01/15/14 0520  WBC 6.8 5.8  HGB 9.7* 9.6*  HCT 29.0* 28.5*  PLT 271 294   BMET  Recent Labs  01/14/14 0520 01/15/14 0520  NA 142 141  K 3.9 3.3*  CL 103 102  CO2 31 30  GLUCOSE 104* 99  BUN 5* 4*  CREATININE 0.85 0.77  CALCIUM 8.5 8.3*   PT/INR No results found for this basename: LABPROT, INR,  in the last 72 hours ABG No results found for this basename: PHART, PCO2, PO2, HCO3,  in the last 72 hours  MEDS, Scheduled . enoxaparin (LOVENOX) injection  40 mg Subcutaneous Q24H    Studies/Results: No results found.  Assessment: s/p Procedure(s): LAPAROSCOPIC LOW ANTERIOR RESECTION    LOOP ILEOSTOMY  Expected post op course  Plan: Cont soft diet Ambulate Cont Ostomy teaching today Anticipate D/C in AM   LOS: 4 days     .Rosario Adie, [facility], [place]   01/16/2014 11:42 AM

## 2014-01-16 NOTE — Consult Note (Signed)
WOC ostomy follow up Stoma type/location: RUQ ileostomy Stomal assessment/size: 1 and 3/8 inches round with red rubber catheter "bridge" in place Peristomal assessment: intact, clear Treatment options for stomal/peristomal skin: convex pouch (1pc) tried today Output 190ml brown liquid stool Ostomy pouching: 1pc.with convexity pouch  Education provided: Elongated session with followup education regarding diet, activity, pouch change frequency, pouch emptying frequency, etc.  Discussion of sick day rules and importance of hydration, also signs and symptoms of food blockage.Patient emptied pouch independently yesterday; today I showed her how to use toilet paper "wicks" to clean tail closure of pouch. Patient will still require assistance of HHR for a couple of weeks post discharge. Enrolled patient in [place] Start Discharge program: Yes [place] nursing team will continue to follow, and will remain available to this patient, the nursing and surgical teams.  Please re-consult if needed. Thanks, Maudie Flakes, MSN, RN, [place], [place], [place] [phone])

## 2014-01-16 NOTE — Consult Note (Signed)
WOC ostomy consult note: Later entry for visit made 01/15/14 at 1:15pm Stoma type/location: RUQ ileostomy.  Patient reports leak over weekend. Husband, who was present when I came to room and asked if this was a good time to change her pouch, has now left the room. Patient explains that spouse has been "melancholy" since her surgery but they will "get through it". Stomal assessment/size: 1 and 3/8 inches round, budded, edematous, functioning Peristomal assessment: intact, clear Treatment options for stomal/peristomal skin:skin barrier ring used to prevent leakage Output 164ml liquid effluent Ostomy pouching:2pc., 2 and 3/4 inch Education provided: Introduction to A&P, stoma characteristics, pouch characteristics, nature of effluent in relationship to A&P, rationale for skin barrier ring.  Lateral aspect of 2-piece pouching system is near right breast. WIth next pouch change, I will attempt a 1-piece pouch with convexity and see if more comfortable. [place] nursing team will follow, and will remain available to this patient, the nursing, surgical and medical teams.   Thanks, Maudie Flakes, MSN, RN, [place], [place], [place] [phone])

## 2014-01-16 NOTE — Discharge Instructions (Signed)
ABDOMINAL SURGERY: POST OP INSTRUCTIONS  1. DIET: Follow a light bland diet the first 24 hours after arrival home, such as soup, liquids, crackers, etc.  Be sure to include lots of fluids daily.  Avoid fast food or heavy meals as your are more likely to get nauseated.  Do not eat any uncooked fruits or vegetables for the next 2 weeks as your bowel heals. 2. Take your usually prescribed home medications unless otherwise directed. 3. PAIN CONTROL: a. Pain is best controlled by a usual combination of three different methods TOGETHER: i. Ice/Heat ii. Over the counter pain medication iii. Prescription pain medication b. Most patients will experience some swelling and bruising around the incisions.  Ice packs or heating pads (30-60 minutes up to 6 times a day) will help. Use ice for the first few days to help decrease swelling and bruising, then switch to heat to help relax tight/sore spots and speed recovery.  Some people prefer to use ice alone, heat alone, alternating between ice & heat.  Experiment to what works for you.  Swelling and bruising can take several weeks to resolve.   c. It is helpful to take an over-the-counter pain medication regularly for the first few weeks.  Choose one of the following that works best for you: i. Naproxen (Aleve, etc)  Two 220mg  tabs twice a day ii. Ibuprofen (Advil, etc) Three 200mg  tabs four times a day (every meal & bedtime) iii. Acetaminophen (Tylenol, etc) 500-650mg  four times a day (every meal & bedtime) d. A  prescription for pain medication (such as oxycodone, hydrocodone, etc) should be given to you upon discharge.  Take your pain medication as prescribed.  i. If you are having problems/concerns with the prescription medicine (does not control pain, nausea, vomiting, rash, itching, etc), please call us [phone] to see if we need to switch you to a different pain medicine that will work better for you and/or control your side effect better. ii. If you  need a refill on your pain medication, please contact your pharmacy.  They will contact our office to request authorization. Prescriptions will not be filled after 5 pm or on week-ends. 4. Avoid getting constipated.  Between the surgery and the pain medications, it is common to experience some constipation.  Increasing fluid intake and taking a fiber supplement (such as Metamucil, Citrucel, FiberCon, MiraLax, etc) 1-2 times a day regularly will usually help prevent this problem from occurring.  A mild laxative (prune juice, Milk of Magnesia, MiraLax, etc) should be taken according to package directions if there are no bowel movements after 48 hours.   5. Watch out for diarrhea.  If you have many loose bowel movements, simplify your diet to bland foods & liquids for a few days.  Stop any stool softeners and decrease your fiber supplement.  Switching to mild anti-diarrheal medications (Kayopectate, Pepto Bismol) can help.  If this worsens or does not improve, please call us. 6. Wash / shower every day.  You may shower over the incision / wound.  Avoid baths until the skin is fully healed.  Continue to shower over incision(s) after the dressing is off. 7. Remove your waterproof bandages 5 days after surgery.  You may leave the incision open to air.  You may replace a dressing/Band-Aid to cover the incision for comfort if you wish. 8. ACTIVITIES as tolerated:   a. You may resume regular (light) daily activities beginning the next day--such as daily self-care, walking, climbing stairs--gradually increasing activities as  tolerated.  If you can walk 30 minutes without difficulty, it is safe to try more intense activity such as jogging, treadmill, bicycling, low-impact aerobics, swimming, etc. °b. Save the most intensive and strenuous activity for last such as sit-ups, heavy lifting, contact sports, etc  Refrain from any heavy lifting or straining until you are off narcotics for pain control.   °c. DO NOT PUSH THROUGH  PAIN.  Let pain be your guide: If it hurts to do something, don't do it.  Pain is your body warning you to avoid that activity for another week until the pain goes down. °d. You may drive when you are no longer taking prescription pain medication, you can comfortably wear a seatbelt, and you can safely maneuver your car and apply brakes. °e. You may have sexual intercourse when it is comfortable.  °9. FOLLOW UP in our office °a. Please call [facility] at [phone] to set up an appointment to see your surgeon in the office for a follow-up appointment approximately 1-2 weeks after your surgery. °b. Make sure that you call for this appointment the day you arrive home to insure a convenient appointment time. °10. IF YOU HAVE DISABILITY OR FAMILY LEAVE FORMS, BRING THEM TO THE OFFICE FOR PROCESSING.  DO NOT GIVE THEM TO YOUR DOCTOR. ° ° °WHEN TO CALL US [phone]: °1. Poor pain control °2. Reactions / problems with new medications (rash/itching, nausea, etc)  °3. Fever over 101.5 F (38.5 C) °4. Inability to urinate °5. Nausea and/or vomiting °6. Worsening swelling or bruising °7. Continued bleeding from incision. °8. Increased pain, redness, or drainage from the incision ° °The clinic staff is available to answer your questions during regular business hours (8:30am-5pm).  Please don’t hesitate to call and ask to speak to one of our nurses for clinical concerns.   A surgeon from [facility] is always on call at the hospitals °  °If you have a medical emergency, go to the nearest emergency room or call 911. °  ° °[facility] °[address], [address], ,  [place]  [address] ? °MAIN: [phone] ? TOLL FREE: [phone] ? °FAX [phone] °[email or web address] ° ° °

## 2014-01-17 MED ORDER — OXYCODONE-ACETAMINOPHEN 5-325 MG PO TABS: 1.0000 | ORAL_TABLET | ORAL | Status: DC | PRN

## 2014-01-17 NOTE — Discharge Summary (Signed)
Physician Discharge Summary  Patient ID: Victoria [name] MRN: [id number] DOB/AGE: 62-Aug-1952 62 y.o.  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 She did well postop bleed. She began to have bowel function on postop day 1. Her Foley catheter was removed on postop day 2. She can ambulate well and was able to come off of her IV narcotics. Her diet was advanced slowly. She began ostomy education. By postop day 5 the patient was tolerating a regular diet and had good ostomy output. She was urinating without difficulty and her pain was controlled with by mouth narcotics. She was counseled on ileostomy fluid management. She will take Imodium and extra fluids at home if she has elevated output or decreased urine output.  Consults: None  Significant Diagnostic Studies: labs: CBC and chemistry  Treatments: IV hydration, analgesia: acetaminophen w/ codeine and surgery: Low anterior Resection with loop ileostomy  Discharge Exam: Blood pressure 132/76, pulse 71, temperature 98.2 F (36.8 C), temperature source Oral, resp. rate 16, height 5\' 5"  (1.651 m), weight 157 lb 6.4 oz (71.396 kg), SpO2 99.00%. General appearance: alert and cooperative GI: normal findings: soft, non-tender Incision/Wound:Clean, dry, intact, no surrounding erythema or drainage noted  Disposition: 01-Home or Self Care   Future Appointments Provider Department Dept Phone   01/22/2014 11:15 AM [facility] [phone]   01/22/2014 11:45 AM Ladell Pier, MD [facility] [phone]       Medication List         ASA-APAP-Caff  Buffered [phone] MG Tabs  Take 1 tablet by mouth every 6 (six) hours as needed (pain).     cholecalciferol 1000 UNITS tablet  Commonly known as:  VITAMIN D  Take 1,000 Units by mouth daily.     fexofenadine 180 MG tablet  Commonly known as:  ALLEGRA  Take 180 mg by mouth daily.     mometasone 50 MCG/ACT nasal spray  Commonly known as:  NASONEX  Place 2 sprays into the nose daily.     oxyCODONE-acetaminophen 5-325 MG per tablet  Commonly known as:  PERCOCET/ROXICET  Take 1-2 tablets by mouth every 4 (four) hours as needed for moderate pain.           Follow-up Information   Follow up with Rosario Adie., MD. Schedule an appointment as soon as possible for a visit in 2 weeks.   Specialty:  General Surgery   Contact information:   [address] [place] [address] [phone]       Signed: Rosario Adie 6/60/6301, 60:10 AM

## 2014-01-22 ENCOUNTER — Other Ambulatory Visit (HOSPITAL_BASED_OUTPATIENT_CLINIC_OR_DEPARTMENT_OTHER): Payer: BC Managed Care – PPO

## 2014-01-22 ENCOUNTER — Telehealth: Payer: Self-pay | Admitting: Oncology

## 2014-01-22 ENCOUNTER — Encounter: Payer: Self-pay | Admitting: *Deleted

## 2014-01-22 ENCOUNTER — Ambulatory Visit (HOSPITAL_BASED_OUTPATIENT_CLINIC_OR_DEPARTMENT_OTHER): Payer: BC Managed Care – PPO | Admitting: Oncology

## 2014-01-22 ENCOUNTER — Other Ambulatory Visit: Payer: Self-pay | Admitting: *Deleted

## 2014-01-22 VITALS — BP 138/79 | HR 87 | Temp 98.0°F | Resp 20 | Ht 65.0 in | Wt 152.0 lb

## 2014-01-22 DIAGNOSIS — C20 Malignant neoplasm of rectum: Secondary | ICD-10-CM

## 2014-01-22 DIAGNOSIS — E876 Hypokalemia: Secondary | ICD-10-CM

## 2014-01-22 DIAGNOSIS — R634 Abnormal weight loss: Secondary | ICD-10-CM

## 2014-01-22 LAB — CBC WITH DIFFERENTIAL/PLATELET
BASO%: 0.7 % (ref 0.0–2.0)
Basophils Absolute: 0 10*3/uL (ref 0.0–0.1)
EOS%: 0.2 % (ref 0.0–7.0)
Eosinophils Absolute: 0 10*3/uL (ref 0.0–0.5)
HCT: 32.2 % — ABNORMAL LOW (ref 34.8–46.6)
HGB: 10.7 g/dL — ABNORMAL LOW (ref 11.6–15.9)
LYMPH%: 12.5 % — AB (ref 14.0–49.7)
MCH: 28.5 pg (ref 25.1–34.0)
MCHC: 33.1 g/dL (ref 31.5–36.0)
MCV: 86.3 fL (ref 79.5–101.0)
MONO#: 0.4 10*3/uL (ref 0.1–0.9)
MONO%: 8.2 % (ref 0.0–14.0)
NEUT%: 78.4 % — ABNORMAL HIGH (ref 38.4–76.8)
NEUTROS ABS: 3.9 10*3/uL (ref 1.5–6.5)
Platelets: 419 10*3/uL — ABNORMAL HIGH (ref 145–400)
RBC: 3.74 10*6/uL (ref 3.70–5.45)
RDW: 16.1 % — AB (ref 11.2–14.5)
WBC: 4.9 10*3/uL (ref 3.9–10.3)
lymph#: 0.6 10*3/uL — ABNORMAL LOW (ref 0.9–3.3)

## 2014-01-22 MED ORDER — CAPECITABINE 500 MG PO TABS: 1500.0000 mg | ORAL_TABLET | Freq: Two times a day (BID) | ORAL | Status: DC

## 2014-01-22 NOTE — [facility] Navigator Note (Signed)
Met with patient and husband.  Patient is doing well with her ostomy at present time.  The [facility] nurse has been coming to the house regularly.  Patient denies any difficulty with care of ostomy or supplies at present time.  She was pleased that her appetite is slowly coming back.  She met with the dietician this morning.  Per MD, adjuvant treatment is planned with Xeloda.  Discussed with patient and husband ways to take pills and how to help get them down easily.  Patient is also going to be taking Potassium as prescribed.  Patient and husband stated they would call this RN if they have not heard anything on Xeloda prescription.  Patient and husband denied any barriers to care at present time but  acknowledged they have contact numbers and names.  Will continue to follow.

## 2014-01-22 NOTE — Progress Notes (Signed)
   [place]    OFFICE PROGRESS NOTE   INTERVAL HISTORY:   Victoria [name] returns for scheduled followup of rectal cancer. She underwent a low anterior resection and loop ileostomy on 01/12/2014. There was no evidence of metastatic disease. The pathology ([id number]) revealed invasive adenocarcinoma signet ring cell features and abundant extracellular mucin invading into the muscular propria, pericolonic fatty tissue, and focally involving the peritoneum. 3 of 21 lymph nodes were positive for adenocarcinoma. The resection margins are negative. A moderate treatment response was noted. There was normal in expression of mismatch repair proteins.  She reports recovering from surgery well. She empties the ileostomy bag approximately 3-4 times per day. Her appetite has improved following surgery.  Objective:  Vital signs in last 24 hours:  Blood pressure 138/79, pulse 87, temperature 98 F (36.7 C), temperature source Oral, resp. rate 20, height $RemoveBe'5\' 5"'WFHepmexE$  (1.651 m), weight 152 lb (68.947 kg).   HEENT: The mucous membranes are moist Resp: Lungs clear bilaterally Cardio: Regular rate and rhythm GI: No hepatomegaly, right abdominal ileostomy with liquid stool, healed low transverse incision Vascular: No leg edema  Skin: Palms and soles erythema or skin breakdown     Lab Results:  Lab Results  Component Value Date   WBC 5.8 01/15/2014   HGB 9.6* 01/15/2014   HCT 28.5* 01/15/2014   MCV 83.6 01/15/2014   PLT 294 01/15/2014   NEUTROABS 3.1 11/20/2013   potassium 3.3, BUN 4, creatinine 0.77    Medications: I have reviewed the patient's current medications.  Assessment/Plan: 1. 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  Initiation of radiation and concurrent Xeloda 10/16/2013. Low anterior resection and diverting ileostomy 01/12/2014, stage IIIB-ypT4,ypN1 tumor with negative surgical margins No loss of expression of mismatch repair proteins 2. Indeterminate  right lung nodule on CT 09/29/2013. 3. Weight loss. 4. Hypokalemia 01/15/2014-she will begin a potassium supplement  Disposition:  Victoria [name] appears to have recovered from the low anterior resection and ileostomy procedure. I encouraged her to push fluids and advance her diet as tolerated. She has been diagnosed with locally advanced rectal cancer. We discussed the indication for adjuvant systemic therapy. She has a significant chance of developing recurrent disease. I recommend completing a course of adjuvant systemic chemotherapy. We discussed single agent capecitabine and oxaliplatin-based therapy. I described the potential increased benefit associated with the addition of oxaliplatin. We reviewed the toxicities associated with oxaliplatin. She does not wish to receive oxaliplatin.  The plan is to begin adjuvant capecitabine on 02/05/2014. She will return for an office visit 02/08/2014 to be sure she is tolerating the ileostomy. She will also be scheduled for an office visit on 02/20/2014. She will begin a potassium supplement and we will check a chemistry panel 02/08/2014.  We discussed the toxicities associated with capecitabine including the chance for mucositis, diarrhea, and the hand/foot syndrome . She will have Imodium on hand to use as needed. She knows to contact us for diarrhea.   Betsy Coder, MD  01/22/2014  11:30 AM

## 2014-01-22 NOTE — Telephone Encounter (Signed)
gv adn printed appt sched and avs for pt for March.... °

## 2014-01-23 ENCOUNTER — Telehealth: Payer: Self-pay | Admitting: *Deleted

## 2014-01-23 ENCOUNTER — Encounter (HOSPITAL_COMMUNITY): Payer: Self-pay

## 2014-01-23 NOTE — Telephone Encounter (Signed)
RECEIVED A FAX FROM [facility] CONCERNING A CONFIRMATION OF FACSIMILE RECEIPT FOR PT. REFERRAL. 

## 2014-01-23 NOTE — Telephone Encounter (Signed)
Per request of Dr. Benay Spice, order sent to J. Epps and K. Sharlett Iles in pathology to perform MSI testing on Accession: [id number].

## 2014-01-31 ENCOUNTER — Telehealth: Payer: Self-pay | Admitting: Dietician

## 2014-01-31 NOTE — Telephone Encounter (Signed)
Brief Outpatient Oncology Nutrition Note  Patient has been identified to be at risk on malnutrition screen.  Wt Readings from Last 10 Encounters:  01/22/14 152 lb (68.947 kg)  01/12/14 157 lb 6.4 oz (71.396 kg)  01/12/14 157 lb 6.4 oz (71.396 kg)  01/09/14 157 lb 6.4 oz (71.396 kg)  01/04/14 159 lb 4.8 oz (72.258 kg)  12/20/13 157 lb 6 oz (71.385 kg)  11/24/13 164 lb 14.4 oz (74.798 kg)  11/20/13 167 lb 4.8 oz (75.887 kg)  11/17/13 168 lb 11.2 oz (76.522 kg)  11/10/13 168 lb 1.6 oz (76.25 kg)    Dx:  Rectal Cancer  Called patient due to continued weight loss.  Spoke with patient's husband at the beginning of January and he was concerned about her poor intake at that time.  Patient underwent a low anterior resection and loop ileostomy 01/12/14 and patient reports improving intake since then.  Drinks Boost.    Discussed tips to further increase nutrition.  Trying 5-6 small meals per day, increasing Boost as needed to increase protein and calorie intake.  Will mail coupons for Boost and contact information for the [facility].  Patient saw her last in November 2014.,  Antonieta Iba, RD, LDN

## 2014-02-01 ENCOUNTER — Ambulatory Visit (INDEPENDENT_AMBULATORY_CARE_PROVIDER_SITE_OTHER): Payer: BC Managed Care – PPO | Admitting: General Surgery

## 2014-02-01 ENCOUNTER — Encounter: Payer: Self-pay | Admitting: *Deleted

## 2014-02-01 ENCOUNTER — Encounter (INDEPENDENT_AMBULATORY_CARE_PROVIDER_SITE_OTHER): Payer: Self-pay | Admitting: General Surgery

## 2014-02-01 VITALS — BP 120/84 | HR 80 | Temp 97.6°F | Resp 14 | Ht 65.0 in | Wt 149.4 lb

## 2014-02-01 DIAGNOSIS — C20 Malignant neoplasm of rectum: Secondary | ICD-10-CM

## 2014-02-01 NOTE — Progress Notes (Signed)
Victoria [name] is a 63 y.o. female who is status post a LAR and coloanal anastomosis with loop ileostomy on 2/13.  Victoria [name] returns for followup of rectal cancer surgery.  The pathology revealed invasive adenocarcinoma signet ring cell features and abundant extracellular mucin invading into the muscular propria, pericolonic fatty tissue, and focally involving the peritoneum. 3 of 21 lymph nodes were positive for adenocarcinoma. The resection margins are negative. A moderate treatment response was noted. There was normal in expression of mismatch repair proteins. She reports recovering from surgery well. She empties the ileostomy bag approximately 4-5 times per day.  She is urinating well. Her appetite has improved following surgery.  She has decided to complete her adjuvant Xeloda but against IV chemotherapy.    Objective: Filed Vitals:   02/01/14 0914  BP: 120/84  Pulse: 80  Temp: 97.6 F (36.4 C)  Resp: 14    General appearance: alert and cooperative, no signs of dehydration GI: normal findings: soft, non-tender Ost: pink, viable, functioning well Incision: healing well, slight anastomotic stricture   Assessment: s/p  Patient Active Problem List   Diagnosis Date Noted  . 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 Rectal cancer uT3uN1 s/p lap LAR/loop ileostomy 09/27/2013    Plan: I have encouraged her to continue the boost.  She is down 3-4 lbs since surgery.  I have asked her to replace her water intake with an electrolyte based fluid.  I will see her back in 3-4 weeks to check her anastomosis.    Rosario Adie, [facility], [place]   02/01/2014 9:34 AM

## 2014-02-01 NOTE — Progress Notes (Signed)
RECEIVED A FAX FROM [facility] CONCERNING A CONFIRMATION OF PRESCRIPTION SHIPMENT FOR CAPECITABINE ON 01/31/14. 

## 2014-02-01 NOTE — Patient Instructions (Signed)
Continue current care.  Return to the office in 3-4 weeks.

## 2014-02-05 ENCOUNTER — Telehealth: Payer: Self-pay | Admitting: *Deleted

## 2014-02-05 NOTE — Telephone Encounter (Signed)
Spoke with patient by phone.  She started her Xeloda today.  This RN reviewed dosage and instructions.  Patient verbalized comprehension and had no questions.  Patient voiced frustration that her ostomy supplies had not been sent to her  by mail order company.  She had to go over to [facility] last Friday afternoon for assistance as she was down to her last bag.  She stated Dr. Marcello Moores' s office had ordered her supplies and that they should come in this week.  She stated she would contact this RN if further assistance with ostomy supplies was needed.  Will continue to follow.

## 2014-02-08 ENCOUNTER — Ambulatory Visit (HOSPITAL_BASED_OUTPATIENT_CLINIC_OR_DEPARTMENT_OTHER): Payer: BC Managed Care – PPO | Admitting: Nurse Practitioner

## 2014-02-08 ENCOUNTER — Telehealth: Payer: Self-pay | Admitting: *Deleted

## 2014-02-08 ENCOUNTER — Other Ambulatory Visit: Payer: Self-pay | Admitting: *Deleted

## 2014-02-08 ENCOUNTER — Other Ambulatory Visit (HOSPITAL_BASED_OUTPATIENT_CLINIC_OR_DEPARTMENT_OTHER): Payer: BC Managed Care – PPO

## 2014-02-08 ENCOUNTER — Other Ambulatory Visit: Payer: Self-pay | Admitting: Nurse Practitioner

## 2014-02-08 VITALS — BP 138/82 | HR 95 | Temp 97.4°F | Resp 20 | Ht 65.0 in | Wt 147.5 lb

## 2014-02-08 DIAGNOSIS — C20 Malignant neoplasm of rectum: Secondary | ICD-10-CM

## 2014-02-08 DIAGNOSIS — R911 Solitary pulmonary nodule: Secondary | ICD-10-CM

## 2014-02-08 DIAGNOSIS — L989 Disorder of the skin and subcutaneous tissue, unspecified: Secondary | ICD-10-CM

## 2014-02-08 DIAGNOSIS — R634 Abnormal weight loss: Secondary | ICD-10-CM

## 2014-02-08 DIAGNOSIS — Z932 Ileostomy status: Secondary | ICD-10-CM

## 2014-02-08 LAB — BASIC METABOLIC PANEL (CC13)
ANION GAP: 13 meq/L — AB (ref 3–11)
BUN: 18 mg/dL (ref 7.0–26.0)
CALCIUM: 10.3 mg/dL (ref 8.4–10.4)
CO2: 21 mEq/L — ABNORMAL LOW (ref 22–29)
Chloride: 105 mEq/L (ref 98–109)
Creatinine: 1.5 mg/dL — ABNORMAL HIGH (ref 0.6–1.1)
GLUCOSE: 97 mg/dL (ref 70–140)
POTASSIUM: 3.9 meq/L (ref 3.5–5.1)
SODIUM: 138 meq/L (ref 136–145)

## 2014-02-08 LAB — CBC WITH DIFFERENTIAL/PLATELET
BASO%: 0.2 % (ref 0.0–2.0)
BASOS ABS: 0 10*3/uL (ref 0.0–0.1)
EOS%: 0.6 % (ref 0.0–7.0)
Eosinophils Absolute: 0 10*3/uL (ref 0.0–0.5)
HEMATOCRIT: 36.4 % (ref 34.8–46.6)
HGB: 12 g/dL (ref 11.6–15.9)
LYMPH%: 17 % (ref 14.0–49.7)
MCH: 27.5 pg (ref 25.1–34.0)
MCHC: 33 g/dL (ref 31.5–36.0)
MCV: 83.3 fL (ref 79.5–101.0)
MONO#: 0.5 10*3/uL (ref 0.1–0.9)
MONO%: 8.8 % (ref 0.0–14.0)
NEUT%: 73.4 % (ref 38.4–76.8)
NEUTROS ABS: 3.9 10*3/uL (ref 1.5–6.5)
Platelets: 309 10*3/uL (ref 145–400)
RBC: 4.37 10*6/uL (ref 3.70–5.45)
RDW: 14.4 % (ref 11.2–14.5)
WBC: 5.3 10*3/uL (ref 3.9–10.3)
lymph#: 0.9 10*3/uL (ref 0.9–3.3)
nRBC: 0 % (ref 0–0)

## 2014-02-08 MED ORDER — LOPERAMIDE HCL 2 MG PO CAPS: 2.0000 mg | ORAL_CAPSULE | Freq: Four times a day (QID) | ORAL | Status: DC

## 2014-02-08 NOTE — Progress Notes (Signed)
OFFICE PROGRESS NOTE  Interval history:  Victoria [name] returns for followup of rectal cancer. She began cycle 1 adjuvant Xeloda on 02/05/2014. She denies nausea/vomiting. Ileostomy output ranges from watery to pasty. She does not feel the ostomy output is excessive. For the past week stool has been "leaking" around the appliance. She notes that the skin is irritated. She denies mouth sores. Appetite is better. She is pushing fluids.   Objective: Filed Vitals:   02/08/14 0916  BP: 138/82  Pulse: 95  Temp: 97.4 F (36.3 C)  Resp: 20   Oropharynx is without thrush or ulceration. Lungs are clear. Regular cardiac rhythm. Abdomen is soft and nontender. Right upper quadrant ileostomy. Skin at the medial edge of the appliance is erythematous with a few superficial areas of skin breakdown. Low abdominal wound is healed. No leg edema.   Lab Results: Lab Results  Component Value Date   WBC 4.9 01/22/2014   HGB 10.7* 01/22/2014   HCT 32.2* 01/22/2014   MCV 86.3 01/22/2014   PLT 419* 01/22/2014   NEUTROABS 3.9 01/22/2014    Chemistry:    Chemistry      Component Value Date/Time   NA 138 02/08/2014 0858   NA 141 01/15/2014 0520   K 3.9 02/08/2014 0858   K 3.3* 01/15/2014 0520   CL 102 01/15/2014 0520   CO2 21* 02/08/2014 0858   CO2 30 01/15/2014 0520   BUN 18.0 02/08/2014 0858   BUN 4* 01/15/2014 0520   CREATININE 1.5* 02/08/2014 0858   CREATININE 0.77 01/15/2014 0520   CREATININE 0.89 12/20/2013 1210      Component Value Date/Time   CALCIUM 10.3 02/08/2014 0858   CALCIUM 8.3* 01/15/2014 0520   ALKPHOS 83 12/20/2013 1210   ALKPHOS 93 11/20/2013 1546   AST 21 12/20/2013 1210   AST 17 11/20/2013 1546   ALT 19 12/20/2013 1210   ALT 17 11/20/2013 1546   BILITOT 0.5 12/20/2013 1210   BILITOT 0.25 11/20/2013 1546       Studies/Results: No results found.  Medications: I have reviewed the patient's current medications.  Assessment/Plan: 1. Clinical stage III (uT3 uN1) adenocarcinoma of the  rectum.  Initiation of radiation and concurrent Xeloda 10/16/2013.  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 Cycle 1 adjuvant Xeloda 02/05/2014. 2. Indeterminate right lung nodule on CT 09/29/2013. 3. Weight loss. Weight is stable to mildly decreased. 4. Skin irritation around the ostomy. Question secondary to leakage of stool, question adhesive reaction, question tape injury. She will meet with an ostomy nurse today.   Dispositon-she appears stable. She began cycle 1 adjuvant Xeloda 02/05/2014. She is scheduled to return for a followup visit on 02/20/2014. She understands to begin Imodium if she experiences increased watery output from the ostomy.   Ned Card ANP/GNP-BC

## 2014-02-08 NOTE — Progress Notes (Signed)
Met with patient in exam room during visit with NP.  She was very frustrated with her ostomy.  She was having leakage from ostomy which had caused skin irritation around the ostomy site.  She stated this had been going on for 3-4 days, but that she had not called anyone about it.  This RN paged [facility] RN, Victoria [name], and asked if patient could be seen by her or other [place] RN, but she stated they were not available to come today.  This RN called Administrator, Civil Service at [employer].  She said to have the patient come right over to her office and she would work her in.  Patient and husband were given the name, number, and directions for [facility] medical supply.  They were very appreciative, verbalized understanding, and stated they were going straight there from this office.  Patient also confirmed that she had received her supplies last week from [place].

## 2014-02-08 NOTE — Progress Notes (Signed)
Removed current ostomy appliance due to leakage. Skin medial and inferior to stoma is raw and irritated where stool has leaked. Prior bad did not appear to be on with stoma centered in middle-was more to lateral to stoma. Cleaned skin gently with warm wash, then dried and new appliance applied from patient's supply (it was precut and was too large for stoma). She will now go to see ostomy nurse at [facility] for more detailed care. Reports she usually applies bag while standing (husband does this for her).

## 2014-02-08 NOTE — Telephone Encounter (Signed)
Notified husband that creatinine is higher=dehydration. She needs to come to office tomorrow at 1145 for lab, then IVF. Start Imodium 2 mg four times daily--get in at least 2 doses tonight. Push po fluids. He understands and agrees. Will have her here tomorrow.

## 2014-02-09 ENCOUNTER — Other Ambulatory Visit: Payer: Self-pay | Admitting: Nurse Practitioner

## 2014-02-09 ENCOUNTER — Ambulatory Visit (HOSPITAL_BASED_OUTPATIENT_CLINIC_OR_DEPARTMENT_OTHER): Payer: BC Managed Care – PPO

## 2014-02-09 ENCOUNTER — Other Ambulatory Visit (HOSPITAL_BASED_OUTPATIENT_CLINIC_OR_DEPARTMENT_OTHER): Payer: BC Managed Care – PPO

## 2014-02-09 DIAGNOSIS — R634 Abnormal weight loss: Secondary | ICD-10-CM

## 2014-02-09 DIAGNOSIS — Z932 Ileostomy status: Secondary | ICD-10-CM

## 2014-02-09 DIAGNOSIS — C20 Malignant neoplasm of rectum: Secondary | ICD-10-CM

## 2014-02-09 LAB — BASIC METABOLIC PANEL (CC13)
ANION GAP: 13 meq/L — AB (ref 3–11)
BUN: 19.3 mg/dL (ref 7.0–26.0)
CHLORIDE: 102 meq/L (ref 98–109)
CO2: 23 mEq/L (ref 22–29)
Calcium: 10 mg/dL (ref 8.4–10.4)
Creatinine: 1.4 mg/dL — ABNORMAL HIGH (ref 0.6–1.1)
Glucose: 98 mg/dl (ref 70–140)
Potassium: 4.9 mEq/L (ref 3.5–5.1)
SODIUM: 138 meq/L (ref 136–145)

## 2014-02-09 MED ORDER — SODIUM CHLORIDE 0.9 % IV SOLN
Freq: Once | INTRAVENOUS | Status: AC
  Administered 2014-02-09: 13:00:00 via INTRAVENOUS

## 2014-02-12 ENCOUNTER — Telehealth: Payer: Self-pay | Admitting: Oncology

## 2014-02-12 NOTE — Telephone Encounter (Signed)
lmonvm for pt re appt for 3/17 and confirmed 3/24 appt.

## 2014-02-13 ENCOUNTER — Telehealth: Payer: Self-pay | Admitting: Oncology

## 2014-02-13 ENCOUNTER — Other Ambulatory Visit (HOSPITAL_BASED_OUTPATIENT_CLINIC_OR_DEPARTMENT_OTHER): Payer: BC Managed Care – PPO

## 2014-02-13 DIAGNOSIS — C20 Malignant neoplasm of rectum: Secondary | ICD-10-CM

## 2014-02-13 LAB — BASIC METABOLIC PANEL (CC13)
Anion Gap: 11 mEq/L (ref 3–11)
BUN: 16.8 mg/dL (ref 7.0–26.0)
CHLORIDE: 105 meq/L (ref 98–109)
CO2: 22 meq/L (ref 22–29)
CREATININE: 1.4 mg/dL — AB (ref 0.6–1.1)
Calcium: 10 mg/dL (ref 8.4–10.4)
GLUCOSE: 103 mg/dL (ref 70–140)
Potassium: 4.3 mEq/L (ref 3.5–5.1)
SODIUM: 138 meq/L (ref 136–145)

## 2014-02-13 NOTE — Telephone Encounter (Signed)
changed time of 3/24 appt. s/w pt re new time for 3/24 lb/fu @ 11:45am.. pt will get new schedule when she comes in for labs today.

## 2014-02-19 ENCOUNTER — Other Ambulatory Visit: Payer: Self-pay | Admitting: *Deleted

## 2014-02-19 NOTE — Telephone Encounter (Signed)
THIS REILL REQUEST FOR CAPECITABINE WAS GIVEN TO DR.SHERRILL'S NURSE, SUSAN COWARD,RN.

## 2014-02-20 ENCOUNTER — Other Ambulatory Visit (HOSPITAL_BASED_OUTPATIENT_CLINIC_OR_DEPARTMENT_OTHER): Payer: BC Managed Care – PPO

## 2014-02-20 ENCOUNTER — Ambulatory Visit (HOSPITAL_BASED_OUTPATIENT_CLINIC_OR_DEPARTMENT_OTHER): Payer: BC Managed Care – PPO | Admitting: Nurse Practitioner

## 2014-02-20 ENCOUNTER — Telehealth: Payer: Self-pay | Admitting: Oncology

## 2014-02-20 VITALS — BP 126/79 | HR 96 | Temp 98.3°F | Resp 18 | Ht 65.0 in | Wt 147.2 lb

## 2014-02-20 DIAGNOSIS — C20 Malignant neoplasm of rectum: Secondary | ICD-10-CM

## 2014-02-20 DIAGNOSIS — R944 Abnormal results of kidney function studies: Secondary | ICD-10-CM

## 2014-02-20 DIAGNOSIS — R911 Solitary pulmonary nodule: Secondary | ICD-10-CM

## 2014-02-20 LAB — COMPREHENSIVE METABOLIC PANEL (CC13)
ALBUMIN: 3.9 g/dL (ref 3.5–5.0)
ALK PHOS: 104 U/L (ref 40–150)
ALT: 17 U/L (ref 0–55)
AST: 19 U/L (ref 5–34)
Anion Gap: 10 mEq/L (ref 3–11)
BILIRUBIN TOTAL: 0.32 mg/dL (ref 0.20–1.20)
BUN: 25.8 mg/dL (ref 7.0–26.0)
CO2: 21 mEq/L — ABNORMAL LOW (ref 22–29)
CREATININE: 1.6 mg/dL — AB (ref 0.6–1.1)
Calcium: 10.1 mg/dL (ref 8.4–10.4)
Chloride: 104 mEq/L (ref 98–109)
GLUCOSE: 105 mg/dL (ref 70–140)
Potassium: 4.4 mEq/L (ref 3.5–5.1)
Sodium: 135 mEq/L — ABNORMAL LOW (ref 136–145)
Total Protein: 8.6 g/dL — ABNORMAL HIGH (ref 6.4–8.3)

## 2014-02-20 LAB — CBC WITH DIFFERENTIAL/PLATELET
BASO%: 0.3 % (ref 0.0–2.0)
BASOS ABS: 0 10*3/uL (ref 0.0–0.1)
EOS%: 0.6 % (ref 0.0–7.0)
Eosinophils Absolute: 0 10*3/uL (ref 0.0–0.5)
HCT: 36.2 % (ref 34.8–46.6)
HEMOGLOBIN: 11.9 g/dL (ref 11.6–15.9)
LYMPH%: 20.1 % (ref 14.0–49.7)
MCH: 27.7 pg (ref 25.1–34.0)
MCHC: 32.8 g/dL (ref 31.5–36.0)
MCV: 84.7 fL (ref 79.5–101.0)
MONO#: 0.5 10*3/uL (ref 0.1–0.9)
MONO%: 11.1 % (ref 0.0–14.0)
NEUT#: 3.1 10*3/uL (ref 1.5–6.5)
NEUT%: 67.9 % (ref 38.4–76.8)
PLATELETS: 219 10*3/uL (ref 145–400)
RBC: 4.28 10*6/uL (ref 3.70–5.45)
RDW: 15.2 % — ABNORMAL HIGH (ref 11.2–14.5)
WBC: 4.6 10*3/uL (ref 3.9–10.3)
lymph#: 0.9 10*3/uL (ref 0.9–3.3)

## 2014-02-20 NOTE — Progress Notes (Addendum)
OFFICE PROGRESS NOTE  Interval history:  Victoria [name] returns as scheduled. She completed cycle 1 Xeloda beginning 02/05/2014. She denies nausea/vomiting. No mouth sores. Ostomy output ranges from watery to pasty. She is not taking Imodium. She is pushing fluids by mouth. No hand or foot pain or redness. Main complaint is a lack of energy which she attributes to Xeloda.   Objective: Filed Vitals:   02/20/14 1221  BP: 126/79  Pulse: 96  Temp: 98.3 F (36.8 C)  Resp: 18   Oropharynx is without thrush or ulceration. Mucous membranes appear moist. Lungs are clear. Regular cardiac rhythm. Abdomen soft and nontender. No hepatomegaly. Right upper quadrant ileostomy. No leg edema. Palms without erythema.   Lab Results: Lab Results  Component Value Date   WBC 4.6 02/20/2014   HGB 11.9 02/20/2014   HCT 36.2 02/20/2014   MCV 84.7 02/20/2014   PLT 219 02/20/2014   NEUTROABS 3.1 02/20/2014    Chemistry:    Chemistry      Component Value Date/Time   NA 135* 02/20/2014 1132   NA 141 01/15/2014 0520   K 4.4 02/20/2014 1132   K 3.3* 01/15/2014 0520   CL 102 01/15/2014 0520   CO2 21* 02/20/2014 1132   CO2 30 01/15/2014 0520   BUN 25.8 02/20/2014 1132   BUN 4* 01/15/2014 0520   CREATININE 1.6* 02/20/2014 1132   CREATININE 0.77 01/15/2014 0520   CREATININE 0.89 12/20/2013 1210      Component Value Date/Time   CALCIUM 10.1 02/20/2014 1132   CALCIUM 8.3* 01/15/2014 0520   ALKPHOS 104 02/20/2014 1132   ALKPHOS 83 12/20/2013 1210   AST 19 02/20/2014 1132   AST 21 12/20/2013 1210   ALT 17 02/20/2014 1132   ALT 19 12/20/2013 1210   BILITOT 0.32 02/20/2014 1132   BILITOT 0.5 12/20/2013 1210       Studies/Results: No results found.  Medications: I have reviewed the patient's current medications.  Assessment/Plan: 1. 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  Initiation of radiation and concurrent Xeloda 10/16/2013.  Low anterior resection and diverting ileostomy 01/12/2014, stage  IIIB-ypT4,ypN1 tumor with negative surgical margins  No loss of expression of mismatch repair proteins.  Cycle 1 adjuvant Xeloda 02/05/2014. 2. Indeterminate right lung nodule on CT 09/29/2013. 3. Weight loss. Weight is stable. 4. Skin irritation around the ostomy 02/08/2014. Question secondary to leakage of stool, question adhesive reaction, question tape injury. She met with an ostomy nurse. Improved. 5. Increased creatinine. Likely due to mild dehydration related to ileostomy output.  Dispositon-she appears stable. She has completed one cycle of adjuvant Xeloda. Plan to proceed with cycle 2 as scheduled beginning 02/26/2014.  She is likely mildly dehydrated related to ileostomy output. She will continue to push fluids by mouth. She will begin Imodium 4 times daily.  We will see her in followup on 03/15/2014. She will contact the office in the interim with any problems.  Patient seen with Dr. Benay Spice.   Victoria [name] ANP/GNP-BC    This was a shared visit with Victoria [name].  I encouraged her to increase the use of Imodium in an attempt to diminish the ostomy output. She appears to be tolerating the Xeloda well. The plan is to complete a total of 5-6 cycles of adjuvant Xeloda.  Julieanne Manson, M.D.

## 2014-02-20 NOTE — Telephone Encounter (Signed)
per pof sch Dr appt per GBS @12 :15 labs for 11:45 for 4/16

## 2014-02-23 ENCOUNTER — Encounter: Payer: Self-pay | Admitting: Oncology

## 2014-02-23 NOTE — Telephone Encounter (Signed)
RECEIVED A FAX FROM [facility] CONCERNING A CONFIRMATION OF PRESCRIPTION SHIPMENT FOR CAPECITABINE ON 02/22/14.

## 2014-02-23 NOTE — Progress Notes (Signed)
CASE MANAGER INFORMATION:  Jerold Coombe, RN, BSN [facility] [phone] FAX: [phone]

## 2014-02-28 ENCOUNTER — Ambulatory Visit (INDEPENDENT_AMBULATORY_CARE_PROVIDER_SITE_OTHER): Payer: BC Managed Care – PPO | Admitting: General Surgery

## 2014-02-28 ENCOUNTER — Encounter (INDEPENDENT_AMBULATORY_CARE_PROVIDER_SITE_OTHER): Payer: Self-pay | Admitting: General Surgery

## 2014-02-28 VITALS — BP 110/70 | HR 80 | Temp 97.8°F | Resp 16 | Ht 65.0 in | Wt 153.6 lb

## 2014-02-28 DIAGNOSIS — C20 Malignant neoplasm of rectum: Secondary | ICD-10-CM

## 2014-02-28 DIAGNOSIS — I739 Peripheral vascular disease, unspecified: Secondary | ICD-10-CM

## 2014-02-28 HISTORY — DX: Peripheral vascular disease, unspecified: I73.9

## 2014-02-28 NOTE — Addendum Note (Signed)
Addended by: Rosario Adie on: 02/02/1442 10:57 AM   Modules accepted: Orders

## 2014-02-28 NOTE — Patient Instructions (Signed)
We will schedule you for surgery in the beginning of May.

## 2014-02-28 NOTE — Progress Notes (Signed)
Victoria [name] is a 63 y.o. female who is status post a LAR and coloanal anastomosis with loop ileostomy on 01/12/14. Victoria [name] returns for followup of rectal cancer surgery. The pathology revealed invasive adenocarcinoma signet ring cell features and abundant extracellular mucin invading into the muscular propria, pericolonic fatty tissue, and focally involving the peritoneum. 3 of 21 lymph nodes were positive for adenocarcinoma. The resection margins are negative. A moderate treatment response was noted. There was normal in expression of mismatch repair proteins. She reports recovering from surgery well. She empties the ileostomy bag approximately 4-5 times per day. She is urinating well. Her appetite has improved following surgery. She has decided to complete her adjuvant Xeloda but against IV chemotherapy.  Objective:  Filed Vitals:   02/28/14 1031  BP: 110/70  Pulse: 80  Temp: 97.8 F (36.6 C)  Resp: 16   General appearance: alert and cooperative, no signs of dehydration  GI: normal findings: soft, non-tender  Ost: pink, viable, functioning well  Incision: healing well, slight anastomotic stricture  Assessment:  s/p  Patient Active Problem List    Diagnosis  Date Noted   .  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  Rectal cancer uT3uN1 s/p lap LAR/loop ileostomy  09/27/2013   Plan:  She is doing better. She is gaining weight. She is on Xeloda for adjuvant treatment.  I will schedule her surgery tentatively for the beginning of May.  I will contact Dr. Benay Spice to see if she can come off of her chemo for the surgery.  The risk and benefits of surgery were explained. These mainly include difficulty with evacuation and urgency with possible leakage. Other concerns after surgery would be wound infection, pelvic infection/leak and hernia.

## 2014-03-01 ENCOUNTER — Telehealth: Payer: Self-pay | Admitting: Oncology

## 2014-03-01 ENCOUNTER — Other Ambulatory Visit: Payer: Self-pay | Admitting: *Deleted

## 2014-03-12 ENCOUNTER — Other Ambulatory Visit: Payer: Self-pay | Admitting: *Deleted

## 2014-03-12 DIAGNOSIS — C20 Malignant neoplasm of rectum: Secondary | ICD-10-CM

## 2014-03-12 NOTE — Telephone Encounter (Signed)
THIS REFILL REQUEST FOR CAPECITABINE WAS GIVEN TO DR.SHERRILL'S NURSE, SUSAN COWARD,RN. 

## 2014-03-13 MED ORDER — CAPECITABINE 500 MG PO TABS: 1500.0000 mg | ORAL_TABLET | Freq: Two times a day (BID) | ORAL | Status: DC

## 2014-03-13 NOTE — Telephone Encounter (Signed)
RECEIVED A FAX FROM [facility] CONCERNING A CONFIRMATION OF FACSIMILE RECEIPT FOR PT. REFERRAL. 

## 2014-03-13 NOTE — Addendum Note (Signed)
Addended by: Wyonia Hough on: 03/13/2014 11:19 AM   Modules accepted: Orders

## 2014-03-15 ENCOUNTER — Telehealth: Payer: Self-pay | Admitting: Oncology

## 2014-03-15 ENCOUNTER — Other Ambulatory Visit (HOSPITAL_BASED_OUTPATIENT_CLINIC_OR_DEPARTMENT_OTHER): Payer: BC Managed Care – PPO

## 2014-03-15 ENCOUNTER — Ambulatory Visit (HOSPITAL_BASED_OUTPATIENT_CLINIC_OR_DEPARTMENT_OTHER): Payer: BC Managed Care – PPO | Admitting: Oncology

## 2014-03-15 ENCOUNTER — Ambulatory Visit: Payer: BC Managed Care – PPO | Admitting: Oncology

## 2014-03-15 ENCOUNTER — Other Ambulatory Visit: Payer: BC Managed Care – PPO

## 2014-03-15 ENCOUNTER — Ambulatory Visit (HOSPITAL_COMMUNITY)
Admission: RE | Admit: 2014-03-15 | Discharge: 2014-03-15 | Disposition: A | Discharge status: Home / Self Care | Payer: BC Managed Care – PPO | Source: Ambulatory Visit | Attending: Oncology | Admitting: Oncology

## 2014-03-15 ENCOUNTER — Other Ambulatory Visit: Payer: Self-pay | Admitting: *Deleted

## 2014-03-15 VITALS — BP 135/79 | HR 69 | Temp 98.2°F | Resp 20 | Ht 65.0 in | Wt 159.3 lb

## 2014-03-15 DIAGNOSIS — M7989 Other specified soft tissue disorders: Secondary | ICD-10-CM

## 2014-03-15 DIAGNOSIS — R609 Edema, unspecified: Secondary | ICD-10-CM | POA: Diagnosis present

## 2014-03-15 DIAGNOSIS — I82409 Acute embolism and thrombosis of unspecified deep veins of unspecified lower extremity: Secondary | ICD-10-CM

## 2014-03-15 DIAGNOSIS — C20 Malignant neoplasm of rectum: Secondary | ICD-10-CM

## 2014-03-15 LAB — COMPREHENSIVE METABOLIC PANEL (CC13)
ALBUMIN: 3.6 g/dL (ref 3.5–5.0)
ALT: 10 U/L (ref 0–55)
ANION GAP: 10 meq/L (ref 3–11)
AST: 17 U/L (ref 5–34)
Alkaline Phosphatase: 101 U/L (ref 40–150)
BILIRUBIN TOTAL: 0.38 mg/dL (ref 0.20–1.20)
BUN: 15.2 mg/dL (ref 7.0–26.0)
CO2: 27 meq/L (ref 22–29)
Calcium: 9.3 mg/dL (ref 8.4–10.4)
Chloride: 107 mEq/L (ref 98–109)
Creatinine: 1.2 mg/dL — ABNORMAL HIGH (ref 0.6–1.1)
Glucose: 97 mg/dl (ref 70–140)
Potassium: 4 mEq/L (ref 3.5–5.1)
SODIUM: 143 meq/L (ref 136–145)
TOTAL PROTEIN: 7.5 g/dL (ref 6.4–8.3)

## 2014-03-15 LAB — CBC WITH DIFFERENTIAL/PLATELET
BASO%: 0.5 % (ref 0.0–2.0)
Basophils Absolute: 0 10*3/uL (ref 0.0–0.1)
EOS%: 1 % (ref 0.0–7.0)
Eosinophils Absolute: 0 10*3/uL (ref 0.0–0.5)
HEMATOCRIT: 29.3 % — AB (ref 34.8–46.6)
HGB: 9.7 g/dL — ABNORMAL LOW (ref 11.6–15.9)
LYMPH%: 17.8 % (ref 14.0–49.7)
MCH: 29.1 pg (ref 25.1–34.0)
MCHC: 33.2 g/dL (ref 31.5–36.0)
MCV: 87.7 fL (ref 79.5–101.0)
MONO#: 0.6 10*3/uL (ref 0.1–0.9)
MONO%: 15.2 % — ABNORMAL HIGH (ref 0.0–14.0)
NEUT#: 2.6 10*3/uL (ref 1.5–6.5)
NEUT%: 65.5 % (ref 38.4–76.8)
Platelets: 190 10*3/uL (ref 145–400)
RBC: 3.34 10*6/uL — AB (ref 3.70–5.45)
RDW: 20 % — ABNORMAL HIGH (ref 11.2–14.5)
WBC: 3.9 10*3/uL (ref 3.9–10.3)
lymph#: 0.7 10*3/uL — ABNORMAL LOW (ref 0.9–3.3)

## 2014-03-15 MED ORDER — ENOXAPARIN SODIUM 120 MG/0.8ML ~~LOC~~ SOLN
110.0000 mg | Freq: Once | SUBCUTANEOUS | Status: AC
  Administered 2014-03-15: 110 mg via SUBCUTANEOUS

## 2014-03-15 MED ORDER — ENOXAPARIN SODIUM 100 MG/ML ~~LOC~~ SOLN: 100.0000 mg | SUBCUTANEOUS | Status: DC

## 2014-03-15 NOTE — Progress Notes (Addendum)
  [place] OFFICE PROGRESS NOTE   Diagnosis: Rectal cancer  INTERVAL HISTORY:   She completed a second cycle of adjuvant Xeloda 03/10/2014. No nausea, mouth sores, or hand/foot pain. She empties the ileostomy bag approximately 3 times per day. She takes Imodium. She has noted swelling of the left leg for the past few days. No pain.  Objective:  Vital signs in last 24 hours:  Blood pressure 135/79, pulse 69, temperature 98.2 F (36.8 C), temperature source Oral, resp. rate 20, height _0  (1.651 m), weight 159 lb 4.8 oz (72.258 kg), SpO2 100.00%.    HEENT:  No thrush or ulcers Resp: Lungs clear bilaterally Cardio: Regular rate and rhythm GI: No hepatomegaly, right lower quadrant ileostomy Vascular: 1+ edema at the left leg below the knee, no erythema or tenderness  Skin: Mild erythema over the soles.    Lab Results:  Lab Results  Component Value Date   WBC 3.9 03/15/2014   HGB 9.7* 03/15/2014   HCT 29.3* 03/15/2014   MCV 87.7 03/15/2014   PLT 190 03/15/2014   NEUTROABS 2.6 03/15/2014   BUN 15.2, creatinine 1.2, potassium 4.0  Medications: I have reviewed the patient's current medications.  Assessment/Plan: 1. 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  Initiation of radiation and concurrent Xeloda 10/16/2013.  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  Cycle 1 adjuvant Xeloda 02/05/2014. Cycle 2 adjuvant Xeloda 02/26/2014 2. Indeterminate right lung nodule on CT 09/29/2013. 3. History of Weight loss. She has gained weight 4. Skin irritation around the ostomy 02/08/2014. Question secondary to leakage of stool, question adhesive reaction, question tape injury. She met with an ostomy nurse. Improved. 5. Swollen left lower leg-she will be referred for a Doppler today  Disposition:  Ms. Lacina has completed 2  cycles of adjuvant Xeloda. She will begin cycle 3 on 03/18/2014. The plan is to complete a total of 5 cycles of adjuvant Xeloda. Ms. Laverdure will return for an office visit 04/05/2014. We will hold cycle 4 Xeloda if she has surgery within the first few weeks of May.  Ms. Arista will be referred for a Doppler of the left leg today. We will initiate anticoagulation therapy as indicated.  Ladell Pier, MD  03/15/2014  4:12 PM   Addendum: The left lower extremity Doppler confirmed a deep vein thrombosis. I discussed treatment options with Ms. Donofrio and her husband. I recommend anticoagulation with Lovenox. We also discussed Xarelto. She agrees to Lovenox anticoagulation. We reviewed the potential side effects of Lovenox including the chance for bleeding, thrombocytopenia, and the HITT syndrome.  Dr. Marcello Moores may decide to delay the ileostomy takedown with the new DVT diagnosis.

## 2014-03-15 NOTE — Telephone Encounter (Signed)
RECEIVED A FAX FROM [employer] CONCERNING A CONFIRMATION OF PRESCRIPTION SHIPMENT FOR CAPECITABINE ON 03/14/14.

## 2014-03-15 NOTE — Addendum Note (Signed)
Addended by: Brien Few on: 03/15/2014 05:36 PM   Modules accepted: Orders

## 2014-03-15 NOTE — Telephone Encounter (Signed)
lmonvm advising the pt of her may 2015 appts. °

## 2014-03-15 NOTE — Patient Instructions (Signed)
Deep Vein Thrombosis A deep vein thrombosis (DVT) is a blood clot that develops in the deep, larger veins of the leg, arm, or pelvis. These are more dangerous than clots that might form in veins near the surface of the body. A DVT can lead to complications if the clot breaks off and travels in the bloodstream to the lungs.  A DVT can damage the valves in your leg veins, so that instead of flowing upward, the blood pools in the lower leg. This is called post-thrombotic syndrome, and it can result in pain, swelling, discoloration, and sores on the leg. CAUSES Usually, several things contribute to blood clots forming. Contributing factors include:  The flow of blood slows down.  The inside of the vein is damaged in some way.  You have a condition that makes blood clot more easily. RISK FACTORS Some people are more likely than others to develop blood clots. Risk factors include:   Older age, especially over 75 years of age.  Having a family history of blood clots or if you have already had a blot clot.  Having major or lengthy surgery. This is especially true for surgery on the hip, knee, or belly (abdomen). Hip surgery is particularly high risk.  Breaking a hip or leg.  Sitting or lying still for a long time. This includes long-distance travel, paralysis, or recovery from an illness or surgery.  Having cancer or cancer treatment.  Having a long, thin tube (catheter) placed inside a vein during a medical procedure.  Being overweight (obese).  Pregnancy and childbirth.  Hormone changes make the blood clot more easily during pregnancy.  The fetus puts pressure on the veins of the pelvis.  There is a risk of injury to veins during delivery or a caesarean. The risk is highest just after childbirth.  Medicines with the female hormone estrogen. This includes birth control pills and hormone replacement therapy.  Smoking.  Other circulation or heart problems.  SIGNS AND SYMPTOMS When  a clot forms, it can either partially or totally block the blood flow in that vein. Symptoms of a DVT can include:  Swelling of the leg or arm, especially if one side is much worse.  Warmth and redness of the leg or arm, especially if one side is much worse.  Pain in an arm or leg. If the clot is in the leg, symptoms may be more noticeable or worse when standing or walking. The symptoms of a DVT that has traveled to the lungs (pulmonary embolism, PE) usually start suddenly and include:  Shortness of breath.  Coughing.  Coughing up blood or blood-tinged phlegm.  Chest pain. The chest pain is often worse with deep breaths.  Rapid heartbeat. Anyone with these symptoms should get emergency medical treatment right away. Call your local emergency services (911 in the [place] if you have these symptoms. DIAGNOSIS If a DVT is suspected, your health care provider will take a full medical history and perform a physical exam. Tests that also may be required include:  Blood tests, including studies of the clotting properties of the blood.  Ultrasonography to see if you have clots in your legs or lungs.  X-rays to show the flow of blood when dye is injected into the veins (venography).  Studies of your lungs if you have any chest symptoms. PREVENTION  Exercise the legs regularly. Take a brisk 30-minute walk every day.  Maintain a weight that is appropriate for your height.  Avoid sitting or lying in bed   for long periods of time without moving your legs.  Women, particularly those over the age of 35 years, should consider the risks and benefits of taking estrogen medicines, including birth control pills.  Do not smoke, especially if you take estrogen medicines.  Long-distance travel can increase your risk of DVT. You should exercise your legs by walking or pumping the muscles every hour.  In-hospital prevention:  Many of the risk factors above relate to situations that exist with  hospitalization, either for illness, injury, or elective surgery.  Your health care provider will assess you for the need for venous thromboembolism prophylaxis when you are admitted to the hospital. If you are having surgery, your surgeon will assess you the day of or day after surgery.  Prevention may include medical and nonmedical measures. TREATMENT Once identified, a DVT can be treated. It can also be prevented in some circumstances. Once you have had a DVT, you may be at increased risk for a DVT in the future. The most common treatment for DVT is blood thinning (anticoagulant) medicine, which reduces the blood's tendency to clot. Anticoagulants can stop new blood clots from forming and stop old ones from growing. They cannot dissolve existing clots. Your body does this by itself over time. Anticoagulants can be given by mouth, by IV access, or by injection. Your health care provider will determine the best program for you. Other medicines or treatments that may be used are:  Heparin or related medicines (low molecular weight heparin) are usually the first treatment for a blood clot. They act quickly. However, they cannot be taken orally.  Heparin can cause a fall in a component of blood that stops bleeding and forms blood clots (platelets). You will be monitored with blood tests to be sure this does not occur.  Warfarin is an anticoagulant that can be swallowed. It takes a few days to start working, so usually heparin or related medicines are used in combination. Once warfarin is working, heparin is usually stopped.  Less commonly, clot dissolving drugs (thrombolytics) are used to dissolve a DVT. They carry a high risk of bleeding, so they are used mainly in severe cases, where your life or a limb is threatened.  Very rarely, a blood clot in the leg needs to be removed surgically.  If you are unable to take anticoagulants, your health care provider may arrange for you to have a filter placed  in a main vein in your abdomen. This filter prevents clots from traveling to your lungs. HOME CARE INSTRUCTIONS  Take all medicines prescribed by your health care provider. Only take over-the-counter or prescription medicines for pain, fever, or discomfort as directed by your health care provider.  Warfarin. Most people will continue taking warfarin after hospital discharge. Your health care provider will advise you on the length of treatment (usually 3 6 months, sometimes lifelong).  Too much and too little warfarin are both dangerous. Too much warfarin increases the risk of bleeding. Too little warfarin continues to allow the risk for blood clots. While taking warfarin, you will need to have regular blood tests to measure your blood clotting time. These blood tests usually include both the prothrombin time (PT) and international normalized ratio (INR) tests. The PT and INR results allow your health care provider to adjust your dose of warfarin. The dose can change for many reasons. It is critically important that you take warfarin exactly as prescribed, and that you have your PT and INR levels drawn exactly as directed.    Many foods, especially foods high in vitamin K, can interfere with warfarin and affect the PT and INR results. Foods high in vitamin K include spinach, kale, broccoli, cabbage, collard and turnip greens, brussel sprouts, peas, cauliflower, seaweed, and parsley as well as beef and pork liver, green tea, and soybean oil. You should eat a consistent amount of foods high in vitamin K. Avoid major changes in your diet, or notify your health care provider before changing your diet. Arrange a visit with a dietitian to answer your questions.  Many medicines can interfere with warfarin and affect the PT and INR results. You must tell your health care provider about any and all medicines you take. This includes all vitamins and supplements. Be especially cautious with aspirin and  anti-inflammatory medicines. Ask your health care provider before taking these. Do not take or discontinue any prescribed or over-the-counter medicine except on the advice of your health care provider or pharmacist.  Warfarin can have side effects, primarily excessive bruising or bleeding. You will need to hold pressure over cuts for longer than usual. Your health care provider or pharmacist will discuss other potential side effects.  Alcohol can change the body's ability to handle warfarin. It is best to avoid alcoholic drinks or consume only very small amounts while taking warfarin. Notify your health care provider if you change your alcohol intake.  Notify your dentist or other health care providers before procedures.  Activity. Ask your health care provider how soon you can go back to normal activities. It is important to stay active to prevent blood clots. If you are on anticoagulant medicine, avoid contact sports.  Exercise. It is very important to exercise. This is especially important while traveling, sitting, or standing for long periods of time. Exercise your legs by walking or by pumping the muscles frequently. Take frequent walks.  Compression stockings. These are tight elastic stockings that apply pressure to the lower legs. This pressure can help keep the blood in the legs from clotting. You may need to wear compression stockings at home to help prevent a DVT.  Do not smoke. If you smoke, quit. Ask your health care provider for help with quitting smoking.  Learn as much as you can about DVT. Knowing more about the condition should help you keep it from coming back.  Wear a medical alert bracelet or carry a medical alert card. SEEK MEDICAL CARE IF:  You notice a rapid heartbeat.  You feel weaker or more tired than usual.  You feel faint.  You notice increased bruising.  You feel your symptoms are not getting better in the time expected.  You believe you are having side  effects of medicine. SEEK IMMEDIATE MEDICAL CARE IF:  You have chest pain.  You have trouble breathing.  You have new or increased swelling or pain in one leg.  You cough up blood.  You notice blood in vomit, in a bowel movement, or in urine. MAKE SURE YOU:  Understand these instructions.  Will watch your condition.  Will get help right away if you are not doing well or get worse. Document Released: 11/16/2005 Document Revised: 09/06/2013 Document Reviewed: 07/24/2013 [employer] Patient Information 2014 [employer]. Enoxaparin, Home Use Enoxaparin (Lovenox) injection is a medication used to prevent clots from developing in your veins. Medications such as enoxaparin are called blood thinners or anticoagulants. If blood clots are untreated they could travel to your lungs. This is called a pulmonary embolus. A blood clot in your lungs can   be fatal. Caregivers often use anticoagulants such as enoxaparin to prevent clots following surgery. It is also used along with aspirin when the heart is not getting enough blood. Continue the enoxaparin injections as directed by your caregiver. Your caregiver will use blood clotting test results to decide when you can safely stop using enoxaparin injections. If your caregiver prescribes any additional anticoagulant, you must take it exactly as directed. RISKS AND COMPLICATIONS  If you have received recent epidural anesthesia, spinal anesthesia, or a spinal tap while receiving anticoagulants, you are at risk for developing a blood clot in or around the spine. This condition could result in long-term or permanent paralysis.  Because anticoagulants thin your blood, severe bleeding may occur from any tissue or organ. Symptoms of the blood being too thin may include:  Bleeding from the nose or gums that does not stop quickly.  Unusual bruising or bruising easily.  Swelling or pain at an injection site.  A cut that does not stop bleeding within 10  minutes.  Continual nausea for more than 1 day or vomiting blood.  Coughing up blood.  Blood in the urine which may appear as pink, red, or brown urine.  Blood in bowel movements which may appear as red, dark or black stools.  Sudden weakness or numbness of the face, arm, or leg, especially on one side of the body.  Sudden confusion.  Trouble speaking (aphasia) or understanding.  Sudden trouble seeing in one or both eyes.  Sudden trouble walking.  Dizziness.  Loss of balance or coordination.  Severe pain, such as a headache, joint pain, or back pain.  Fever.  Bruising around the injection sites may be expected.  Platelet drops, known as "thrombocytopenia," can occur with enoxaparin use. A condition called "heparin-induced thrombocytopenia" has been seen. If you have had this condition, you should tell your caregiver. Your caregiver may direct you to have blood tests to monitor this condition.  Do not use if you have allergies to the medication, heparin, or pork products.  Other side effects may include mild local reactions or irritation at the site of injection, pain, bruising, and redness of skin. HOME CARE INSTRUCTIONS You will be instructed by your caregiver how to give enoxaparin injections. 1. Before giving your medication you should make sure the injection is a clear and colorless or pale yellow solution. If your medication becomes discolored or has particles in the bottle, do not use and notify your caregiver. 2. When using the 30 and 40 mg pre-filled syringes, do not expel the air bubble from the syringe before the injection. This makes sure you use all the medication in the syringe. 3. The injections will be given subcutaneously. This means it is given into the fat over the belly (abdomen). It is given deep beneath the skin but not into the muscle. The shots should be injected around the abdominal wall. Change the sites of injection each time. The whole length of the  needle should be introduced into a skin fold held between the thumb and forefinger; the skin fold should be held throughout the injection. Do not rub the injection site after completion of the injection. This increases bruising. Enoxaparin injection pre-filled syringes and graduated pre-filled syringes are available with a system that shields the needle after injection. 4. Inject by pushing the plunger to the bottom of the syringe. 5. Remove the syringe from the injection site keeping your finger on the plunger rod. Be careful not to stick yourself or others. 6.   After injection and the syringe is empty, set off the safety system by firmly pushing the plunger rod. The protective sleeve will automatically cover the needle and you can hear a click. The click means your needle is safely covered. Do not try replacing the needle shield. 7. Get rid of the syringe in the nearest sharps container. 8. Keep your medication safely stored at room temperatures.  Due to the complications of anticoagulants, it is very important that you take your anticoagulant as directed by your caregiver. Anticoagulants need to be taken exactly as instructed. Be sure you understand all your anticoagulant instructions.  Changes in medicines, supplements, diet, and illness can affect your anticoagulation therapy. Be sure to inform your caregivers of any of these changes.  While on anticoagulants, you will need to have blood tests done routinely as directed by your caregivers.  Be careful not to cut yourself when using sharp objects.  Limit physical activities or sports that could result in a fall or cause injury.  It is extremely important that you tell all of your caregivers and dentist that you are taking an anticoagulant, especially if you are injured or plan to have any type of procedure or operation.  Follow up with your laboratory test and caregiver appointments as directed. It is very important to keep your appointments.  Not keeping appointments could result in a chronic or permanent injury, pain, or disability. SEEK MEDICAL CARE IF:  You develop any rashes.  You have any worsening of the condition for which you are receiving anticoagulation therapy. SEEK IMMEDIATE MEDICAL CARE IF:  Bleeding from the nose or gums does not stop quickly.  You have unusual bruising or are bruising easily.  Swelling or pain occurs at an injection site.  A cut does not stop bleeding within 10 minutes.  You have continual nausea for more than 1 day or are vomiting blood.  You are coughing up blood.  You have blood in the urine.  You have dark or black stools.  You have sudden weakness or numbness of the face, arm, or leg, especially on one side of the body.  You have sudden confusion.  You have trouble speaking (aphasia) or understanding.  You have sudden trouble seeing in one or both eyes.  You have sudden trouble walking.  You have dizziness.  You have a loss of balance or coordination.  You have severe pain, such as a headache, joint pain, or back pain.  You have a serious fall or head injury, even if you are not bleeding.  You have an oral temperature above 102 F (38.9 C), not controlled by medicine. ANY OF THESE SYMPTOMS MAY REPRESENT A SERIOUS PROBLEM THAT IS AN EMERGENCY. Do not wait to see if the symptoms will go away. Get medical help right away. Call your local emergency services (911 in [place] DO NOT drive yourself to the hospital. MAKE SURE YOU:  Understand these instructions.  Will watch your condition.  Will get help right away if you are not doing well or get worse. Document Released: 09/17/2004 Document Revised: 02/08/2012 Document Reviewed: 11/16/2005 [employer] Patient Information 2014 [employer].  

## 2014-03-15 NOTE — Progress Notes (Signed)
VASCULAR LAB PRELIMINARY  PRELIMINARY  PRELIMINARY  PRELIMINARY  Left lower extremity venous duplex completed.    Preliminary report: Right - No evidence of DVT, superficial thrombus , or Baker's cyst. Left - Positive for acute thrombus coursing from the posterior tibial vein through the popliteal, and femoral veins. There is minimal flow noted.  [place], [place] 03/15/2014, 4:43 PM

## 2014-03-28 ENCOUNTER — Encounter (HOSPITAL_COMMUNITY): Payer: Self-pay | Admitting: Pharmacy Technician

## 2014-03-29 ENCOUNTER — Encounter (HOSPITAL_COMMUNITY): Payer: Self-pay

## 2014-04-02 ENCOUNTER — Telehealth (INDEPENDENT_AMBULATORY_CARE_PROVIDER_SITE_OTHER): Payer: Self-pay | Admitting: General Surgery

## 2014-04-02 NOTE — Telephone Encounter (Signed)
Discussed new PE and treatment with Dr Benay Spice.  We have decided to postpone surgery until 2 months of treatment are completed.  Pt will need to reschedule surgery after June 14

## 2014-04-03 ENCOUNTER — Inpatient Hospital Stay (HOSPITAL_COMMUNITY): Admission: RE | Admit: 2014-04-03 | Payer: BC Managed Care – PPO | Source: Ambulatory Visit

## 2014-04-05 ENCOUNTER — Other Ambulatory Visit (HOSPITAL_BASED_OUTPATIENT_CLINIC_OR_DEPARTMENT_OTHER): Payer: BC Managed Care – PPO

## 2014-04-05 ENCOUNTER — Telehealth: Payer: Self-pay | Admitting: Oncology

## 2014-04-05 ENCOUNTER — Ambulatory Visit (HOSPITAL_BASED_OUTPATIENT_CLINIC_OR_DEPARTMENT_OTHER): Payer: BC Managed Care – PPO | Admitting: Nurse Practitioner

## 2014-04-05 VITALS — BP 141/81 | HR 63 | Temp 98.6°F | Resp 20 | Ht 65.0 in | Wt 159.5 lb

## 2014-04-05 DIAGNOSIS — R911 Solitary pulmonary nodule: Secondary | ICD-10-CM

## 2014-04-05 DIAGNOSIS — I82409 Acute embolism and thrombosis of unspecified deep veins of unspecified lower extremity: Secondary | ICD-10-CM

## 2014-04-05 DIAGNOSIS — C20 Malignant neoplasm of rectum: Secondary | ICD-10-CM

## 2014-04-05 DIAGNOSIS — Z7901 Long term (current) use of anticoagulants: Secondary | ICD-10-CM

## 2014-04-05 LAB — CBC WITH DIFFERENTIAL/PLATELET
BASO%: 0.3 % (ref 0.0–2.0)
Basophils Absolute: 0 10*3/uL (ref 0.0–0.1)
EOS%: 1.3 % (ref 0.0–7.0)
Eosinophils Absolute: 0 10*3/uL (ref 0.0–0.5)
HCT: 30.4 % — ABNORMAL LOW (ref 34.8–46.6)
HGB: 10.2 g/dL — ABNORMAL LOW (ref 11.6–15.9)
LYMPH%: 24.5 % (ref 14.0–49.7)
MCH: 29.3 pg (ref 25.1–34.0)
MCHC: 33.6 g/dL (ref 31.5–36.0)
MCV: 87.4 fL (ref 79.5–101.0)
MONO#: 0.4 10*3/uL (ref 0.1–0.9)
MONO%: 13.8 % (ref 0.0–14.0)
NEUT#: 1.9 10*3/uL (ref 1.5–6.5)
NEUT%: 60.1 % (ref 38.4–76.8)
Platelets: 206 10*3/uL (ref 145–400)
RBC: 3.48 10*6/uL — ABNORMAL LOW (ref 3.70–5.45)
RDW: 19.8 % — AB (ref 11.2–14.5)
WBC: 3.2 10*3/uL — ABNORMAL LOW (ref 3.9–10.3)
lymph#: 0.8 10*3/uL — ABNORMAL LOW (ref 0.9–3.3)

## 2014-04-05 LAB — COMPREHENSIVE METABOLIC PANEL (CC13)
ALT: 9 U/L (ref 0–55)
AST: 17 U/L (ref 5–34)
Albumin: 3.5 g/dL (ref 3.5–5.0)
Alkaline Phosphatase: 98 U/L (ref 40–150)
Anion Gap: 9 mEq/L (ref 3–11)
BUN: 17.7 mg/dL (ref 7.0–26.0)
CALCIUM: 9.8 mg/dL (ref 8.4–10.4)
CHLORIDE: 106 meq/L (ref 98–109)
CO2: 24 mEq/L (ref 22–29)
Creatinine: 1.3 mg/dL — ABNORMAL HIGH (ref 0.6–1.1)
Glucose: 121 mg/dl (ref 70–140)
POTASSIUM: 4.2 meq/L (ref 3.5–5.1)
SODIUM: 140 meq/L (ref 136–145)
Total Bilirubin: 0.27 mg/dL (ref 0.20–1.20)
Total Protein: 7.7 g/dL (ref 6.4–8.3)

## 2014-04-05 NOTE — Telephone Encounter (Signed)
[facility] sent facsimile confirmation of Capcitabine prescription shipment. Capcitabine was shipped on 04-04-2014 with next business day delivery.

## 2014-04-05 NOTE — Telephone Encounter (Signed)
[facility] sent facsimile confirmation of Capcitabine prescription shipment. Capcitabine was shipped on 04-04-2014 with next business day delivery.   

## 2014-04-05 NOTE — Progress Notes (Signed)
  [facility] OFFICE PROGRESS NOTE   Diagnosis:  Rectal cancer.  INTERVAL HISTORY:   Victoria [name] returns as scheduled. She completed cycle 3 adjuvant Xeloda beginning 03/18/2014. She denies nausea/vomiting. No mouth sores. No hand or foot pain or redness. She notes hyperpigmentation over the palms. She continues Imodium 3 times a day. Stool is "thick". She estimates emptying the ostomy bag 3 times a day. She has a good appetite. She continues Lovenox. Left leg swelling has improved.  Objective:  Vital signs in last 24 hours:  Blood pressure 141/81, pulse 63, temperature 98.6 F (37 C), temperature source Oral, resp. rate 20, height $RemoveBe'5\' 5"'QfKtxNYtl$  (1.651 m), weight 159 lb 8 oz (72.349 kg), SpO2 100.00%.    HEENT: No thrush or ulcerations. Resp: Lungs clear. Cardio: Regular cardiac rhythm. GI: Soft and nontender. Right lower quadrant ileostomy. The stool in the collection bag. Vascular: Left lower leg appears larger than the right lower leg. Nontender. Skin: Mild erythema over the palms with scattered areas of hyperpigmentation. No skin breakdown.   Lab Results:  Lab Results  Component Value Date   WBC 3.2* 04/05/2014   HGB 10.2* 04/05/2014   HCT 30.4* 04/05/2014   MCV 87.4 04/05/2014   PLT 206 04/05/2014   NEUTROABS 1.9 04/05/2014    Imaging:  No results found.  Medications: I have reviewed the patient's current medications.  Assessment/Plan: 1. 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  Initiation of radiation and concurrent Xeloda 10/16/2013.  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  Cycle 1 adjuvant Xeloda 02/05/2014.  Cycle 2 adjuvant Xeloda 02/26/2014. Cycle 3 adjuvant Xeloda 03/18/2014. 2. Indeterminate right lung nodule on CT 09/29/2013. 3. History of weight loss. She has gained weight. 4. Skin irritation around the  ostomy 02/08/2014. Question secondary to leakage of stool, question adhesive reaction, question tape injury. She met with an ostomy nurse. Improved. 5. Left leg DVT 03/15/2014. She continues Lovenox.   Disposition: She appears stable. She has completed 3 cycles of adjuvant Xeloda. Plan to proceed with cycle 4 as scheduled beginning 04/08/2014. She will return for a followup visit on 04/27/2014. She will contact the office in the interim with any problems.  Plan reviewed with Dr. Benay Spice.    Owens Shark ANP/GNP-BC   04/05/2014  2:49 PM

## 2014-04-05 NOTE — Telephone Encounter (Signed)
Gave pt appt for lab and Md for May 2015 °

## 2014-04-20 ENCOUNTER — Other Ambulatory Visit: Payer: Self-pay | Admitting: *Deleted

## 2014-04-20 DIAGNOSIS — C20 Malignant neoplasm of rectum: Secondary | ICD-10-CM

## 2014-04-20 MED ORDER — CAPECITABINE 500 MG PO TABS: 1500.0000 mg | ORAL_TABLET | Freq: Two times a day (BID) | ORAL | Status: DC

## 2014-04-20 NOTE — Addendum Note (Signed)
Addended by: Wyonia Hough on: 04/20/2014 03:10 PM   Modules accepted: Orders

## 2014-04-20 NOTE — Telephone Encounter (Signed)
THIS REFILL REQUEST FOR CAPECITABINE WAS PLACED ON DR.SHERRILL'S DESK. 

## 2014-04-24 NOTE — Telephone Encounter (Signed)
Fax from [facility] that script received and in insurance verification process.

## 2014-04-27 ENCOUNTER — Telehealth: Payer: Self-pay | Admitting: *Deleted

## 2014-04-27 ENCOUNTER — Telehealth: Payer: Self-pay | Admitting: Oncology

## 2014-04-27 ENCOUNTER — Other Ambulatory Visit (HOSPITAL_BASED_OUTPATIENT_CLINIC_OR_DEPARTMENT_OTHER): Payer: BC Managed Care – PPO

## 2014-04-27 ENCOUNTER — Ambulatory Visit (HOSPITAL_BASED_OUTPATIENT_CLINIC_OR_DEPARTMENT_OTHER): Payer: BC Managed Care – PPO | Admitting: Oncology

## 2014-04-27 VITALS — BP 138/79 | HR 68 | Temp 98.1°F | Resp 18 | Ht 65.0 in | Wt 157.2 lb

## 2014-04-27 DIAGNOSIS — C20 Malignant neoplasm of rectum: Secondary | ICD-10-CM

## 2014-04-27 DIAGNOSIS — Z7901 Long term (current) use of anticoagulants: Secondary | ICD-10-CM

## 2014-04-27 DIAGNOSIS — R911 Solitary pulmonary nodule: Secondary | ICD-10-CM

## 2014-04-27 DIAGNOSIS — I82409 Acute embolism and thrombosis of unspecified deep veins of unspecified lower extremity: Secondary | ICD-10-CM

## 2014-04-27 LAB — COMPREHENSIVE METABOLIC PANEL (CC13)
ALT: 11 U/L (ref 0–55)
AST: 16 U/L (ref 5–34)
Albumin: 3.9 g/dL (ref 3.5–5.0)
Alkaline Phosphatase: 99 U/L (ref 40–150)
Anion Gap: 12 mEq/L — ABNORMAL HIGH (ref 3–11)
BUN: 21.4 mg/dL (ref 7.0–26.0)
CALCIUM: 9.8 mg/dL (ref 8.4–10.4)
CHLORIDE: 106 meq/L (ref 98–109)
CO2: 19 meq/L — AB (ref 22–29)
CREATININE: 1.3 mg/dL — AB (ref 0.6–1.1)
Glucose: 89 mg/dl (ref 70–140)
POTASSIUM: 4.6 meq/L (ref 3.5–5.1)
SODIUM: 137 meq/L (ref 136–145)
TOTAL PROTEIN: 8.1 g/dL (ref 6.4–8.3)
Total Bilirubin: 0.31 mg/dL (ref 0.20–1.20)

## 2014-04-27 LAB — CBC WITH DIFFERENTIAL/PLATELET
BASO%: 0.4 % (ref 0.0–2.0)
BASOS ABS: 0 10*3/uL (ref 0.0–0.1)
EOS%: 0.5 % (ref 0.0–7.0)
Eosinophils Absolute: 0 10*3/uL (ref 0.0–0.5)
HCT: 33.1 % — ABNORMAL LOW (ref 34.8–46.6)
HGB: 11 g/dL — ABNORMAL LOW (ref 11.6–15.9)
LYMPH%: 20.5 % (ref 14.0–49.7)
MCH: 30.2 pg (ref 25.1–34.0)
MCHC: 33.2 g/dL (ref 31.5–36.0)
MCV: 90.9 fL (ref 79.5–101.0)
MONO#: 0.4 10*3/uL (ref 0.1–0.9)
MONO%: 13.8 % (ref 0.0–14.0)
NEUT#: 1.9 10*3/uL (ref 1.5–6.5)
NEUT%: 64.8 % (ref 38.4–76.8)
Platelets: 238 10*3/uL (ref 145–400)
RBC: 3.64 10*6/uL — ABNORMAL LOW (ref 3.70–5.45)
RDW: 21.1 % — AB (ref 11.2–14.5)
WBC: 3 10*3/uL — ABNORMAL LOW (ref 3.9–10.3)
lymph#: 0.6 10*3/uL — ABNORMAL LOW (ref 0.9–3.3)

## 2014-04-27 NOTE — Telephone Encounter (Signed)
Lovenox dose reviewed with Gerald Stabs, PharmD. Dose is fine with pt's creatinine clearance.

## 2014-04-27 NOTE — Telephone Encounter (Signed)
gv adn printed appt sched and avs for pt for July  °

## 2014-04-27 NOTE — Progress Notes (Signed)
  [place] OFFICE PROGRESS NOTE   Diagnosis: Rectal cancer  INTERVAL HISTORY:   Ms. Victoria [name] returns as scheduled. She completed another cycle of Xeloda beginning 04/08/2014. No mouth sores, hand/foot pain, or diarrhea. She empties the ileostomy bag approximately 4 times per day. She continues daily Lovenox anticoagulation. Good appetite and fluid intake. She reports being scheduled for the ileostomy takedown procedure 05/16/2014.  Objective:  Vital signs in last 24 hours:  Blood pressure 138/79, pulse 68, temperature 98.1 F (36.7 C), temperature source Oral, resp. rate 18, height _0  (1.651 m), weight 157 lb 3.2 oz (71.305 kg), SpO2 100.00%.    HEENT: No thrush or ulcers Resp: Lungs clear bilaterally Cardio: Regular rate and rhythm GI: No hepatomegaly, right lower quadrant ileostomy with liquid stool Vascular: No leg edema  Skin: Palms and soles with mild skin thickening-no erythema or skin break   Portacath/PICC-without erythema  Lab Results:  Lab Results  Component Value Date   WBC 3.0* 04/27/2014   HGB 11.0* 04/27/2014   HCT 33.1* 04/27/2014   MCV 90.9 04/27/2014   PLT 238 04/27/2014   NEUTROABS 1.9 04/27/2014   Potassium 4.6, BUN 21.4, creatinine 1.3, CO2 19  Imaging:  No results found.  Medications: I have reviewed the patient's current medications.  Assessment/Plan: 1. 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  Initiation of radiation and concurrent Xeloda 10/16/2013.  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  Cycle 1 adjuvant Xeloda 02/05/2014.  Cycle 2 adjuvant Xeloda 02/26/2014.  Cycle 3 adjuvant Xeloda 03/18/2014. Cycle 4 adjuvant Xeloda 04/08/2049 2. Indeterminate right lung nodule on CT 09/29/2013. 3. History of weight loss. She has gained weight. 4. Skin irritation around the ostomy  02/08/2014. Question secondary to leakage of stool, question adhesive reaction, question tape injury. She met with an ostomy nurse. Improved. 5. Left leg DVT 03/15/2014. She continues Lovenox.   Disposition:  Victoria [name] appears to be tolerating the Xeloda well. She will complete a final cycle beginning 04/29/2014.  The BUN and creatinine are mildly elevated, likely reflecting a degree of dehydration. We recommended she push oral hydration.  She has been scheduled for the ileostomy takedown on 05/16/2014. She should complete the final cycle of Xeloda on 05/12/2014. I recommended she discontinue Lovenox after the dose on 05/14/2014. She should resume Lovenox in the postoperative setting. We plan to continue Lovenox for one to 2 months after surgery.  Ms. Victoria [name] will return for an office visit and CEA approximately 1 month after surgery.  Ladell Pier, MD  04/27/2014  10:51 AM

## 2014-04-27 NOTE — Telephone Encounter (Signed)
[facility] sent facsimile confirmation of Capecitabine prescription shipment.  Capecitabine was shipped on 04-26-2014 with next business day delivery.    

## 2014-04-30 ENCOUNTER — Telehealth: Payer: Self-pay | Admitting: Nurse Practitioner

## 2014-04-30 NOTE — Telephone Encounter (Signed)
cld & spoke w/pt to adv of time & date of appt-pt understood

## 2014-05-09 ENCOUNTER — Telehealth: Payer: Self-pay | Admitting: *Deleted

## 2014-05-09 ENCOUNTER — Telehealth (INDEPENDENT_AMBULATORY_CARE_PROVIDER_SITE_OTHER): Payer: Self-pay | Admitting: General Surgery

## 2014-05-09 NOTE — Telephone Encounter (Signed)
Call from pt expressing frustration that her surgery has been delayed due to chemo. Informed her this likely due to risk of delayed wound healing and infection. She voiced understanding. Asks when should she stop Lovenox for 06/07/14 surgery? Will review with Dr. Benay Spice.

## 2014-05-09 NOTE — Telephone Encounter (Signed)
Discussed with pt.  Need to wait a few weeks after last dose of chemo before doing surgery.

## 2014-05-10 ENCOUNTER — Telehealth: Payer: Self-pay | Admitting: *Deleted

## 2014-05-10 ENCOUNTER — Other Ambulatory Visit (HOSPITAL_COMMUNITY): Payer: Self-pay | Admitting: Anesthesiology

## 2014-05-10 NOTE — Telephone Encounter (Signed)
Called patient and instructed her to take her last dose of Lovenox on 06/04/14 since surgery has been moved to 06/07/14.  She will be out of medication after 6/16. Asking how long will she be on the Lovenox?

## 2014-05-10 NOTE — Patient Instructions (Signed)
Victoria [name]  05/10/2014   Your procedure is scheduled on: Wednesday June 17th, 2015  Report to [facility] Main Entrance and follow signs to  [place] at 530 AM.  Call this number if you have problems the morning of surgery [phone]   Remember:  Do not eat food or drink liquids :After Midnight.     Take these medicines the morning of surgery with A SIP OF WATER:                                You may not have any metal on your body including hair pins and piercings  Do not wear jewelry, make-up, lotions, powders, or deodorant.   Men may shave face and neck.  Do not bring valuables to the hospital. [place].  Contacts, dentures or bridgework may not be worn into surgery.  Leave suitcase in the car. After surgery it may be brought to your room.  For patients admitted to the hospital, checkout time is 11:00 AM the day of discharge.   Patients discharged the day of surgery will not be allowed to drive home.  Name and phone number of your driver:  Special Instructions: N/A ________________________________________________________________________  [facility] - Preparing for Surgery Before surgery, you can play an important role.  Because skin is not sterile, your skin needs to be as free of germs as possible.  You can reduce the number of germs on your skin by washing with CHG (chlorahexidine gluconate) soap before surgery.  CHG is an antiseptic cleaner which kills germs and bonds with the skin to continue killing germs even after washing. Please DO NOT use if you have an allergy to CHG or antibacterial soaps.  If your skin becomes reddened/irritated stop using the CHG and inform your nurse when you arrive at Short Stay. Do not shave (including legs and underarms) for at least 48 hours prior to the first CHG shower.  You may shave your face/neck. Please follow these instructions carefully:  1.  Shower with CHG Soap the  night before surgery and the  morning of Surgery.  2.  If you choose to wash your hair, wash your hair first as usual with your  normal  shampoo.  3.  After you shampoo, rinse your hair and body thoroughly to remove the  shampoo.                           4.  Use CHG as you would any other liquid soap.  You can apply chg directly  to the skin and wash                       Gently with a scrungie or clean washcloth.  5.  Apply the CHG Soap to your body ONLY FROM THE NECK DOWN.   Do not use on face/ open                           Wound or open sores. Avoid contact with eyes, ears mouth and genitals (private parts).                       Wash face,  Genitals (private parts) with your normal soap.  6.  Wash thoroughly, paying special attention to the area where your surgery  will be performed.  7.  Thoroughly rinse your body with warm water from the neck down.  8.  DO NOT shower/wash with your normal soap after using and rinsing off  the CHG Soap.                9.  Pat yourself dry with a clean towel.            10.  Wear clean pajamas.            11.  Place clean sheets on your bed the night of your first shower and do not  sleep with pets. Day of Surgery : Do not apply any lotions/deodorants the morning of surgery.  Please wear clean clothes to the hospital/surgery center.  FAILURE TO FOLLOW THESE INSTRUCTIONS MAY RESULT IN THE CANCELLATION OF YOUR SURGERY PATIENT SIGNATURE_________________________________  NURSE SIGNATURE__________________________________  ________________________________________________________________________

## 2014-05-10 NOTE — Progress Notes (Signed)
Cbc with dif, cmet 04-27-14 epic Chest ct 12-20-13 epic

## 2014-05-11 ENCOUNTER — Telehealth: Payer: Self-pay | Admitting: *Deleted

## 2014-05-11 ENCOUNTER — Inpatient Hospital Stay (HOSPITAL_COMMUNITY)
Admission: RE | Admit: 2014-05-11 | Discharge: 2014-05-11 | Disposition: A | Discharge status: Home / Self Care | Payer: BC Managed Care – PPO | Source: Ambulatory Visit

## 2014-05-11 MED ORDER — ENOXAPARIN SODIUM 100 MG/ML ~~LOC~~ SOLN: 100.0000 mg | SUBCUTANEOUS | Status: DC

## 2014-05-11 NOTE — Telephone Encounter (Signed)
Left VM that Lovenox will be sent to her pharmacy for refill and that Dr. Benay Spice said she will remain on Lovenox for 1-2 months post op.

## 2014-05-24 ENCOUNTER — Telehealth (INDEPENDENT_AMBULATORY_CARE_PROVIDER_SITE_OTHER): Payer: Self-pay

## 2014-05-24 NOTE — Telephone Encounter (Signed)
Called pt and advised her that she was to stop her Lovenox after her 7/7 dose for her 7/9 sx date.

## 2014-05-25 ENCOUNTER — Encounter (HOSPITAL_COMMUNITY): Payer: Self-pay | Admitting: Pharmacy Technician

## 2014-05-29 ENCOUNTER — Encounter (HOSPITAL_COMMUNITY)
Admission: RE | Admit: 2014-05-29 | Discharge: 2014-05-29 | Disposition: A | Discharge status: Home / Self Care | Payer: BC Managed Care – PPO | Source: Ambulatory Visit | Attending: General Surgery | Admitting: General Surgery

## 2014-05-29 ENCOUNTER — Encounter (INDEPENDENT_AMBULATORY_CARE_PROVIDER_SITE_OTHER): Payer: Self-pay

## 2014-05-29 ENCOUNTER — Encounter (HOSPITAL_COMMUNITY): Payer: Self-pay

## 2014-05-29 DIAGNOSIS — Z01812 Encounter for preprocedural laboratory examination: Secondary | ICD-10-CM | POA: Diagnosis not present

## 2014-05-29 DIAGNOSIS — Z01810 Encounter for preprocedural cardiovascular examination: Secondary | ICD-10-CM | POA: Diagnosis present

## 2014-05-29 HISTORY — DX: Peripheral vascular disease, unspecified: I73.9

## 2014-05-29 LAB — CBC
HCT: 34.7 % — ABNORMAL LOW (ref 36.0–46.0)
Hemoglobin: 11.6 g/dL — ABNORMAL LOW (ref 12.0–15.0)
MCH: 29.8 pg (ref 26.0–34.0)
MCHC: 33.4 g/dL (ref 30.0–36.0)
MCV: 89.2 fL (ref 78.0–100.0)
PLATELETS: 215 10*3/uL (ref 150–400)
RBC: 3.89 MIL/uL (ref 3.87–5.11)
RDW: 16 % — AB (ref 11.5–15.5)
WBC: 3.2 10*3/uL — AB (ref 4.0–10.5)

## 2014-05-29 LAB — BASIC METABOLIC PANEL
BUN: 19 mg/dL (ref 6–23)
CALCIUM: 9.7 mg/dL (ref 8.4–10.5)
CHLORIDE: 98 meq/L (ref 96–112)
CO2: 26 mEq/L (ref 19–32)
Creatinine, Ser: 1.32 mg/dL — ABNORMAL HIGH (ref 0.50–1.10)
GFR calc non Af Amer: 43 mL/min — ABNORMAL LOW (ref >90)
GFR, EST AFRICAN AMERICAN: 49 mL/min — AB (ref >90)
Glucose, Bld: 84 mg/dL (ref 70–99)
Potassium: 4.6 mEq/L (ref 3.7–5.3)
Sodium: 136 mEq/L — ABNORMAL LOW (ref 137–147)

## 2014-05-29 LAB — PROTIME-INR
INR: 0.96 (ref 0.00–1.49)
PROTHROMBIN TIME: 12.8 s (ref 11.6–15.2)

## 2014-05-29 LAB — APTT: aPTT: 28 seconds (ref 24–37)

## 2014-05-29 NOTE — Progress Notes (Signed)
PAT VISIT-- states is not aware if bowel prep is needed pre op-  I instructed her to call dr Marcello Moores office today to obtain clarification if this is needed. Verbalized understanding

## 2014-05-29 NOTE — Patient Instructions (Signed)
Your procedure is scheduled on: 06/07/14   THURSDAY   Report to [place] at  05:30     AM.   Call this number if you have problems the morning of surgery: [phone]      BOWEL PREP AS PER OFFICE  Do not  drink :After Midnight. Wednesday NIGHT   Take these medicines the morning of surgery with A SIP OF WATER: NONE   .  Contacts, dentures or partial plates, or metal hairpins  can not be worn to surgery. Your family will be responsible for glasses, dentures, hearing aides while you are in surgery  Leave suitcase in the car. After surgery it may be brought to your room.  For patients admitted to the hospital, checkout time is 11:00 AM day of  discharge.         [facility] IS NOT RESPONSIBLE FOR ANY VALUABLES                                                                  [facility] - Preparing for Surgery Before surgery, you can play an important role.  Because skin is not sterile, your skin needs to be as free of germs as possible.  You can reduce the number of germs on your skin by washing with CHG (chlorahexidine gluconate) soap before surgery.  CHG is an antiseptic cleaner which kills germs and bonds with the skin to continue killing germs even after washing. Please DO NOT use if you have an allergy to CHG or antibacterial soaps.  If your skin becomes reddened/irritated stop using the CHG and inform your nurse when you arrive at Short Stay. Do not shave (including legs and underarms) for at least 48 hours prior to the first CHG shower.  You may shave your face/neck. Please follow these instructions carefully:  1.  Shower with CHG Soap the night before surgery and the  morning of Surgery.  2.  If you choose to wash your hair, wash your hair first as usual with your  normal  shampoo.  3.  After you shampoo, rinse your hair and body thoroughly to remove the  shampoo.                           4.  Use CHG  as you would any other liquid soap.  You can apply chg directly  to the skin and wash                       Gently with a scrungie or clean washcloth.  5.  Apply the CHG Soap to your body ONLY FROM THE NECK DOWN.   Do not use on face/ open                           Wound or open sores. Avoid contact with eyes, ears mouth and genitals (private parts).                       Wash face,  Genitals (private parts) with your normal soap.  6.  Wash thoroughly, paying special attention to the area where your surgery  will be performed.  7.  Thoroughly rinse your body with warm water from the neck down.  8.  DO NOT shower/wash with your normal soap after using and rinsing off  the CHG Soap.                9.  Pat yourself dry with a clean towel.            10.  Wear clean pajamas.            11.  Place clean sheets on your bed the night of your first shower and do not  sleep with pets. Day of Surgery : Do not apply any lotions/deodorants the morning of surgery.  Please wear clean clothes to the hospital/surgery center.  FAILURE TO FOLLOW THESE INSTRUCTIONS MAY RESULT IN THE CANCELLATION OF YOUR SURGERY PATIENT SIGNATURE_________________________________  NURSE SIGNATURE__________________________________  ________________________________________________________________________    CLEAR LIQUID DIET   Foods Allowed                                                                     Foods Excluded  Coffee and tea, regular and decaf                             liquids that you cannot  Plain Jell-O in any flavor                                             see through such as: Fruit ices (not with fruit pulp)                                     milk, soups, orange juice  Iced Popsicles                                    All solid food Carbonated beverages, regular and diet                                    Cranberry, grape and apple juices Sports drinks like Gatorade Lightly seasoned  clear broth or consume(fat free) Sugar, honey syrup  Sample Menu Breakfast                                Lunch                                     Supper Cranberry juice                    Beef broth  Chicken broth Jell-O                                     Grape juice                           Apple juice Coffee or tea                        Jell-O                                      Popsicle                                                Coffee or tea                        Coffee or tea  _____________________________________________________________________

## 2014-05-29 NOTE — Progress Notes (Signed)
Chest CT  1/15 EPIC

## 2014-06-07 ENCOUNTER — Ambulatory Visit (HOSPITAL_COMMUNITY): Payer: BC Managed Care – PPO | Admitting: Anesthesiology

## 2014-06-07 ENCOUNTER — Encounter (HOSPITAL_COMMUNITY): Payer: Self-pay | Admitting: *Deleted

## 2014-06-07 ENCOUNTER — Inpatient Hospital Stay (HOSPITAL_COMMUNITY)
Admission: RE | Admit: 2014-06-07 | Discharge: 2014-06-10 | DRG: 346 | Disposition: A | Discharge status: Home / Self Care | Payer: BC Managed Care – PPO | Source: Ambulatory Visit | Attending: General Surgery | Admitting: General Surgery

## 2014-06-07 ENCOUNTER — Encounter (HOSPITAL_COMMUNITY)
Admission: RE | Disposition: A | Discharge status: Home / Self Care | Payer: Self-pay | Source: Ambulatory Visit | Attending: General Surgery

## 2014-06-07 ENCOUNTER — Encounter (HOSPITAL_COMMUNITY): Payer: BC Managed Care – PPO | Admitting: Anesthesiology

## 2014-06-07 DIAGNOSIS — Z01810 Encounter for preprocedural cardiovascular examination: Secondary | ICD-10-CM

## 2014-06-07 DIAGNOSIS — Z932 Ileostomy status: Secondary | ICD-10-CM

## 2014-06-07 DIAGNOSIS — Z432 Encounter for attention to ileostomy: Secondary | ICD-10-CM

## 2014-06-07 DIAGNOSIS — Z833 Family history of diabetes mellitus: Secondary | ICD-10-CM

## 2014-06-07 DIAGNOSIS — Z8249 Family history of ischemic heart disease and other diseases of the circulatory system: Secondary | ICD-10-CM

## 2014-06-07 DIAGNOSIS — C20 Malignant neoplasm of rectum: Principal | ICD-10-CM | POA: Diagnosis present

## 2014-06-07 DIAGNOSIS — Z888 Allergy status to other drugs, medicaments and biological substances status: Secondary | ICD-10-CM

## 2014-06-07 DIAGNOSIS — Z79899 Other long term (current) drug therapy: Secondary | ICD-10-CM

## 2014-06-07 DIAGNOSIS — Z86718 Personal history of other venous thrombosis and embolism: Secondary | ICD-10-CM

## 2014-06-07 DIAGNOSIS — Z01812 Encounter for preprocedural laboratory examination: Secondary | ICD-10-CM

## 2014-06-07 HISTORY — PX: ILEOSTOMY CLOSURE: SHX1784

## 2014-06-07 LAB — TYPE AND SCREEN
ABO/RH(D): AB NEG
Antibody Screen: NEGATIVE

## 2014-06-07 SURGERY — CLOSURE, ILEOSTOMY
Anesthesia: General | Site: Abdomen

## 2014-06-07 MED ORDER — LABETALOL HCL 5 MG/ML IV SOLN
INTRAVENOUS | Status: DC | PRN
  Administered 2014-06-07 (×2): 5 mg via INTRAVENOUS

## 2014-06-07 MED ORDER — FENTANYL CITRATE 0.05 MG/ML IJ SOLN
INTRAMUSCULAR | Status: AC
  Filled 2014-06-07: qty 5

## 2014-06-07 MED ORDER — FENTANYL CITRATE 0.05 MG/ML IJ SOLN
INTRAMUSCULAR | Status: DC | PRN
  Administered 2014-06-07: 100 ug via INTRAVENOUS
  Administered 2014-06-07 (×2): 50 ug via INTRAVENOUS
  Administered 2014-06-07: 100 ug via INTRAVENOUS
  Administered 2014-06-07: 50 ug via INTRAVENOUS

## 2014-06-07 MED ORDER — ONDANSETRON HCL 4 MG/2ML IJ SOLN
INTRAMUSCULAR | Status: DC | PRN
  Administered 2014-06-07: 4 mg via INTRAVENOUS

## 2014-06-07 MED ORDER — LABETALOL HCL 5 MG/ML IV SOLN
INTRAVENOUS | Status: AC
  Filled 2014-06-07: qty 4

## 2014-06-07 MED ORDER — HYDROMORPHONE HCL PF 1 MG/ML IJ SOLN
INTRAMUSCULAR | Status: AC
  Filled 2014-06-07: qty 1

## 2014-06-07 MED ORDER — ONDANSETRON HCL 4 MG/2ML IJ SOLN
INTRAMUSCULAR | Status: AC
  Filled 2014-06-07: qty 2

## 2014-06-07 MED ORDER — PROMETHAZINE HCL 25 MG/ML IJ SOLN: 6.2500 mg | INTRAMUSCULAR | Status: DC | PRN

## 2014-06-07 MED ORDER — ROCURONIUM BROMIDE 100 MG/10ML IV SOLN
INTRAVENOUS | Status: AC
  Filled 2014-06-07: qty 1

## 2014-06-07 MED ORDER — CYCLOSPORINE 0.05 % OP EMUL
1.0000 [drp] | Freq: Two times a day (BID) | OPHTHALMIC | Status: DC
  Administered 2014-06-07 – 2014-06-09 (×5): 1 [drp] via OPHTHALMIC
  Filled 2014-06-07 (×8): qty 1

## 2014-06-07 MED ORDER — ONDANSETRON HCL 4 MG PO TABS: 4.0000 mg | ORAL_TABLET | Freq: Four times a day (QID) | ORAL | Status: DC | PRN

## 2014-06-07 MED ORDER — KCL IN DEXTROSE-NACL 20-5-0.45 MEQ/L-%-% IV SOLN
INTRAVENOUS | Status: DC
  Administered 2014-06-07 – 2014-06-09 (×4): via INTRAVENOUS
  Filled 2014-06-07 (×5): qty 1000

## 2014-06-07 MED ORDER — GLYCOPYRROLATE 0.2 MG/ML IJ SOLN
INTRAMUSCULAR | Status: DC | PRN
  Administered 2014-06-07: 0.4 mg via INTRAVENOUS

## 2014-06-07 MED ORDER — MIDAZOLAM HCL 5 MG/5ML IJ SOLN
INTRAMUSCULAR | Status: DC | PRN
  Administered 2014-06-07: 2 mg via INTRAVENOUS

## 2014-06-07 MED ORDER — LACTATED RINGERS IV SOLN
INTRAVENOUS | Status: DC | PRN
  Administered 2014-06-07 (×2): via INTRAVENOUS

## 2014-06-07 MED ORDER — MORPHINE SULFATE 2 MG/ML IJ SOLN
2.0000 mg | INTRAMUSCULAR | Status: DC | PRN
  Filled 2014-06-07: qty 1

## 2014-06-07 MED ORDER — SODIUM CHLORIDE 0.9 % IJ SOLN
INTRAMUSCULAR | Status: AC
  Filled 2014-06-07: qty 10

## 2014-06-07 MED ORDER — ENOXAPARIN SODIUM 100 MG/ML ~~LOC~~ SOLN
100.0000 mg | SUBCUTANEOUS | Status: DC
  Administered 2014-06-08 – 2014-06-09 (×2): 100 mg via SUBCUTANEOUS
  Filled 2014-06-07 (×4): qty 1

## 2014-06-07 MED ORDER — HYDROMORPHONE HCL PF 1 MG/ML IJ SOLN
0.2500 mg | INTRAMUSCULAR | Status: DC | PRN
  Administered 2014-06-07: 0.5 mg via INTRAVENOUS
  Administered 2014-06-07 (×2): 0.25 mg via INTRAVENOUS

## 2014-06-07 MED ORDER — NEOSTIGMINE METHYLSULFATE 10 MG/10ML IV SOLN
INTRAVENOUS | Status: DC | PRN
  Administered 2014-06-07: 3 mg via INTRAVENOUS

## 2014-06-07 MED ORDER — DEXTROSE 5 % IV SOLN
2.0000 g | INTRAVENOUS | Status: DC | PRN
  Administered 2014-06-07: 2 g via INTRAVENOUS

## 2014-06-07 MED ORDER — LIDOCAINE HCL (CARDIAC) 20 MG/ML IV SOLN
INTRAVENOUS | Status: DC | PRN
  Administered 2014-06-07: 80 mg via INTRAVENOUS

## 2014-06-07 MED ORDER — KETOROLAC TROMETHAMINE 15 MG/ML IJ SOLN
15.0000 mg | Freq: Four times a day (QID) | INTRAMUSCULAR | Status: AC
  Administered 2014-06-07 – 2014-06-08 (×4): 15 mg via INTRAVENOUS
  Filled 2014-06-07 (×4): qty 1

## 2014-06-07 MED ORDER — ROCURONIUM BROMIDE 100 MG/10ML IV SOLN
INTRAVENOUS | Status: DC | PRN
  Administered 2014-06-07: 50 mg via INTRAVENOUS

## 2014-06-07 MED ORDER — DEXTROSE 5 % IV SOLN
1.0000 g | Freq: Four times a day (QID) | INTRAVENOUS | Status: AC
  Administered 2014-06-07: 1 g via INTRAVENOUS
  Filled 2014-06-07: qty 1

## 2014-06-07 MED ORDER — EPHEDRINE SULFATE 50 MG/ML IJ SOLN
INTRAMUSCULAR | Status: AC
  Filled 2014-06-07: qty 1

## 2014-06-07 MED ORDER — PROPOFOL 10 MG/ML IV BOLUS
INTRAVENOUS | Status: DC | PRN
  Administered 2014-06-07: 200 mg via INTRAVENOUS

## 2014-06-07 MED ORDER — LIDOCAINE HCL (CARDIAC) 20 MG/ML IV SOLN
INTRAVENOUS | Status: AC
  Filled 2014-06-07: qty 5

## 2014-06-07 MED ORDER — FENTANYL CITRATE 0.05 MG/ML IJ SOLN
INTRAMUSCULAR | Status: AC
  Filled 2014-06-07: qty 2

## 2014-06-07 MED ORDER — ACETAMINOPHEN 10 MG/ML IV SOLN
1000.0000 mg | Freq: Once | INTRAVENOUS | Status: AC
  Administered 2014-06-07: 1000 mg via INTRAVENOUS
  Filled 2014-06-07: qty 100

## 2014-06-07 MED ORDER — ACETAMINOPHEN 325 MG PO TABS: 650.0000 mg | ORAL_TABLET | ORAL | Status: DC | PRN

## 2014-06-07 MED ORDER — ONDANSETRON HCL 4 MG/2ML IJ SOLN
4.0000 mg | Freq: Four times a day (QID) | INTRAMUSCULAR | Status: DC | PRN
  Filled 2014-06-07: qty 2

## 2014-06-07 MED ORDER — MIDAZOLAM HCL 2 MG/2ML IJ SOLN
INTRAMUSCULAR | Status: AC
  Filled 2014-06-07: qty 2

## 2014-06-07 MED ORDER — DEXTROSE 5 % IV SOLN
INTRAVENOUS | Status: AC
  Filled 2014-06-07: qty 2

## 2014-06-07 MED ORDER — OXYCODONE-ACETAMINOPHEN 5-325 MG PO TABS
1.0000 | ORAL_TABLET | ORAL | Status: DC | PRN
  Administered 2014-06-08 – 2014-06-09 (×3): 1 via ORAL
  Administered 2014-06-09: 2 via ORAL
  Filled 2014-06-07 (×3): qty 1
  Filled 2014-06-07: qty 2

## 2014-06-07 MED ORDER — PROPOFOL 10 MG/ML IV BOLUS
INTRAVENOUS | Status: AC
  Filled 2014-06-07: qty 20

## 2014-06-07 SURGICAL SUPPLY — 45 items
BLADE HEX COATED 2.75 (ELECTRODE) ×6 IMPLANT
CHLORAPREP W/TINT 26ML (MISCELLANEOUS) ×3 IMPLANT
COVER MAYO STAND STRL (DRAPES) ×3 IMPLANT
DRAPE LAPAROSCOPIC ABDOMINAL (DRAPES) ×3 IMPLANT
DRAPE UTILITY XL STRL (DRAPES) ×3 IMPLANT
DRAPE WARM FLUID 44X44 (DRAPE) ×3 IMPLANT
ELECT REM PT RETURN 9FT ADLT (ELECTROSURGICAL) ×3
ELECTRODE REM PT RTRN 9FT ADLT (ELECTROSURGICAL) ×1 IMPLANT
GAUZE SPONGE 4X4 12PLY STRL (GAUZE/BANDAGES/DRESSINGS) ×2 IMPLANT
GLOVE BIO SURGEON STRL SZ 6.5 (GLOVE) ×4 IMPLANT
GLOVE BIO SURGEONS STRL SZ 6.5 (GLOVE) ×2
GLOVE BIOGEL PI IND STRL 7.0 (GLOVE) ×1 IMPLANT
GLOVE BIOGEL PI INDICATOR 7.0 (GLOVE) ×2
GOWN L4 XXLG W/PAP TWL (GOWN DISPOSABLE) ×3 IMPLANT
GOWN SPEC L4 XLG W/TWL (GOWN DISPOSABLE) ×6 IMPLANT
KIT BASIN OR (CUSTOM PROCEDURE TRAY) ×3 IMPLANT
LEGGING LITHOTOMY PAIR STRL (DRAPES) ×3 IMPLANT
LIGASURE IMPACT 36 18CM CVD LR (INSTRUMENTS) IMPLANT
MANIFOLD NEPTUNE II (INSTRUMENTS) ×3 IMPLANT
PACK GENERAL/GYN (CUSTOM PROCEDURE TRAY) ×3 IMPLANT
PAD TELFA 2X3 NADH STRL (GAUZE/BANDAGES/DRESSINGS) ×3 IMPLANT
RELOAD PROXIMATE 75MM BLUE (ENDOMECHANICALS) ×3 IMPLANT
SPONGE GAUZE 4X4 12PLY (GAUZE/BANDAGES/DRESSINGS) ×3 IMPLANT
SPONGE LAP 18X18 X RAY DECT (DISPOSABLE) ×3 IMPLANT
STAPLER PROXIMATE 75MM BLUE (STAPLE) ×3 IMPLANT
STAPLER VISISTAT 35W (STAPLE) ×3 IMPLANT
SUCTION POOLE TIP (SUCTIONS) ×3 IMPLANT
SUT NOVA NAB GS-21 1 T12 (SUTURE) ×6 IMPLANT
SUT PDS AB 1 CTX 36 (SUTURE) IMPLANT
SUT PDS AB 1 TP1 96 (SUTURE) IMPLANT
SUT PROLENE 2 0 BLUE (SUTURE) IMPLANT
SUT SILK 2 0 (SUTURE) ×2
SUT SILK 2 0 SH CR/8 (SUTURE) ×3 IMPLANT
SUT SILK 2 0SH CR/8 30 (SUTURE) IMPLANT
SUT SILK 2-0 18XBRD TIE 12 (SUTURE) ×1 IMPLANT
SUT SILK 2-0 30XBRD TIE 12 (SUTURE) IMPLANT
SUT SILK 3 0 (SUTURE) ×2
SUT SILK 3 0 SH CR/8 (SUTURE) ×3 IMPLANT
SUT SILK 3-0 18XBRD TIE 12 (SUTURE) ×1 IMPLANT
SUT VIC AB 2-0 SH 27 (SUTURE) ×4
SUT VIC AB 2-0 SH 27X BRD (SUTURE) ×2 IMPLANT
TAPE CLOTH SURG 4X10 WHT LF (GAUZE/BANDAGES/DRESSINGS) ×3 IMPLANT
TOWEL OR 17X26 10 PK STRL BLUE (TOWEL DISPOSABLE) ×6 IMPLANT
TOWEL OR NON WOVEN STRL DISP B (DISPOSABLE) ×6 IMPLANT
TRAY FOLEY CATH 14FRSI W/METER (CATHETERS) ×3 IMPLANT

## 2014-06-07 NOTE — Progress Notes (Signed)
INITIAL NUTRITION ASSESSMENT  DOCUMENTATION CODES Per approved criteria  -Not Applicable   INTERVENTION: - Diet advancement per MD - RD to monitor plan of care  NUTRITION DIAGNOSIS: Inadequate oral intake related to inability to eat as evidenced by NPO.   Goal: Advance diet as tolerated to regular diet  Monitor:  Weights, labs, diet advancement  Reason for Assessment: Malnutrition screening tool   63 y.o. female  Admitting Dx: Ileostomy reversal   ASSESSMENT: Pt is here today for take down of her ileostomy. She is s/p distal rectal resection and coloanal anastomosis for a distal rectal cancer. She has completed her adjuvant chemotherapy as of 2 weeks ago.   - Had ileostomy takedown today - Pt reports excellent appetite PTA - Was drinking 2 Boost/day - Reports weight gain, 10 pounds in 3 months - Trying to rest, did not perform nutrition focused physical exam   Height: Ht Readings from Last 1 Encounters:  06/07/14 5\' 5"  (1.651 m)    Weight: Wt Readings from Last 1 Encounters:  06/07/14 163 lb (73.936 kg)    Ideal Body Weight: 125 lbs  % Ideal Body Weight: 130%  Wt Readings from Last 10 Encounters:  06/07/14 163 lb (73.936 kg)  06/07/14 163 lb (73.936 kg)  05/29/14 163 lb (73.936 kg)  04/27/14 157 lb 3.2 oz (71.305 kg)  04/05/14 159 lb 8 oz (72.349 kg)  03/15/14 159 lb 4.8 oz (72.258 kg)  02/28/14 153 lb 9.6 oz (69.673 kg)  02/20/14 147 lb 3.2 oz (66.769 kg)  02/08/14 147 lb 8 oz (66.906 kg)  02/01/14 149 lb 6.4 oz (67.767 kg)    Usual Body Weight: 153 lbs  % Usual Body Weight: 106%  BMI:  Body mass index is 27.12 kg/(m^2).  Estimated Nutritional Needs: Kcal: 1500-1700 Protein: 60-75g Fluid: 1.5-1.7L/day   Skin: Intact  Diet Order: NPO  EDUCATION NEEDS: -No education needs identified at this time   Intake/Output Summary (Last 24 hours) at 06/07/14 1415 Last data filed at 06/07/14 1100  Gross per 24 hour  Intake   1450 ml  Output     560 ml  Net    890 ml    Last BM: PTA  Labs:  No results found for this basename: NA, K, CL, CO2, BUN, CREATININE, CALCIUM, MG, PHOS, GLUCOSE,  in the last 168 hours  CBG (last 3)  No results found for this basename: GLUCAP,  in the last 72 hours  Scheduled Meds: . cycloSPORINE  1 drop Both Eyes BID  . [START ON 06/08/2014] enoxaparin  100 mg Subcutaneous Q24H  . HYDROmorphone      . ketorolac  15 mg Intravenous 4 times per day    Continuous Infusions: . dextrose 5 % and 0.45 % NaCl with KCl 20 mEq/L 75 mL/hr at 06/07/14 1302    Past Medical History  Diagnosis Date  . Medical history non-contributory   . HDL deficiency   . Allergy     seasonal allergic rhinitis  . History of radiation therapy 10/16/13-11/27/13    rectal 50.4Gy  . Cancer 09/19/13    rectum  . Peripheral vascular disease 4/15    DVT LEFT LEG    Past Surgical History  Procedure Laterality Date  . Left leg surgery  1986    rod from hip to knee  . Colonoscopy with propofol N/A 09/19/2013    Procedure: COLONOSCOPY WITH PROPOFOL;  Surgeon: Garlan Fair, MD;  Location: [facility] END[facility]COPY;  Service: Endoscopy;  Laterality: N/A;  .  Femur fracture surgery Left 1985    steel rod  . Eus N/A 09/27/2013    Procedure: LOWER ENDOSCOPIC ULTRASOUND (EUS);  Surgeon: Arta Silence, MD;  Location: [facility];  Service: Endoscopy;  Laterality: N/A;  . Laparoscopic low anterior resection N/A 01/12/2014    Procedure: LAPAROSCOPIC LOW ANTERIOR RESECTION ;  Surgeon: Leighton Ruff, MD;  Location: [facility] ORS;  Service: General;  Laterality: N/A;  . Ileo loop diversion N/A 01/12/2014    Procedure:   LOOP ILEOSTOMY;  Surgeon: Leighton Ruff, MD;  Location: [facility] ORS;  Service: General;  Laterality: N/A;    Carlis Stable MS, [place], LDN [phone] Pager [phone] Weekend/After Hours Pager

## 2014-06-07 NOTE — H&P (Signed)
Victoria [name] is an 63 y.o. female.   Chief Complaint: ileostomy present HPI: She is here today for take down of her ileostomy.  She is s/p distal rectal resection and coloanal anastomosis for a distal rectal cancer.  She did develop a DVT post operatively.  This is being treated with SQ Lovenox.  She has completed her adjuvant chemotherapy as of 2 weeks ago.    Past Medical History  Diagnosis Date  . Medical history non-contributory   . HDL deficiency   . Allergy     seasonal allergic rhinitis  . History of radiation therapy 10/16/13-11/27/13    rectal 50.4Gy  . Cancer 09/19/13    rectum  . Peripheral vascular disease 4/15    DVT LEFT LEG    Past Surgical History  Procedure Laterality Date  . Left leg surgery  1986    rod from hip to knee  . Colonoscopy with propofol N/A 09/19/2013    Procedure: COLONOSCOPY WITH PROPOFOL;  Surgeon: Garlan Fair, MD;  Location: [facility];  Service: Endoscopy;  Laterality: N/A;  . Femur fracture surgery Left 1985    steel rod  . Eus N/A 09/27/2013    Procedure: LOWER ENDOSCOPIC ULTRASOUND (EUS);  Surgeon: Arta Silence, MD;  Location: [facility];  Service: Endoscopy;  Laterality: N/A;  . Laparoscopic low anterior resection N/A 01/12/2014    Procedure: LAPAROSCOPIC LOW ANTERIOR RESECTION ;  Surgeon: Leighton Ruff, MD;  Location: [facility];  Service: General;  Laterality: N/A;  . Ileo loop diversion N/A 01/12/2014    Procedure:   LOOP ILEOSTOMY;  Surgeon: Leighton Ruff, MD;  Location: [facility] OR[facility];  Service: General;  Laterality: N/A;    Family History  Problem Relation Age of Onset  . Hypertension Mother   . Diabetes Father   . Heart disease Father     CAD   Social History:  reports that she has never smoked. She has never used smokeless tobacco. She reports that she drinks alcohol. She reports that she does not use illicit drugs.  Allergies:  Allergies  Allergen Reactions  . Ointment Base [Lanolin-Petrolatum] Rash    Specifically  burn ointments.    Medications Prior to Admission  Medication Sig Dispense Refill  . ASA-APAP-Caff Buffered [id number] MG TABS Take 1 tablet by mouth every 6 (six) hours as needed (pain).       . cycloSPORINE (RESTASIS) 0.05 % ophthalmic emulsion Place 1 drop into both eyes 2 (two) times daily.      . psyllium (METAMUCIL) 58.6 % powder Take 1 packet by mouth daily as needed. constipation      . capecitabine (XELODA) 500 MG tablet Take 3 tablets (1,500 mg total) by mouth 2 (two) times daily after a meal.  84 tablet  0  . enoxaparin (LOVENOX) 100 MG/ML injection Inject 100 mg into the skin daily. States LAST DOSE WILL BE  06/05/14      . loperamide (IMODIUM) 2 MG capsule Take 2 mg by mouth 3 (three) times daily as needed for diarrhea or loose stools.        Results for orders placed during the hospital encounter of 06/07/14 (from the past 48 hour(s))  TYPE AND SCREEN     Status: None   Collection Time    06/07/14  5:43 AM      Result Value Ref Range   ABO/RH(D) AB NEG     Antibody Screen NEG     Sample Expiration 06/10/2014     No results  found.  Review of Systems  Constitutional: Negative for fever and chills.  Eyes: Negative for blurred vision.  Respiratory: Negative for cough and shortness of breath.   Cardiovascular: Negative for chest pain.  Gastrointestinal: Negative for nausea, vomiting and abdominal pain.  Genitourinary: Negative for dysuria, urgency and frequency.  Neurological: Negative for headaches.    Blood pressure 134/86, pulse 74, temperature 98.4 F (36.9 C), temperature source Oral, resp. rate 18, SpO2 100.00%. Physical Exam  Constitutional: She is oriented to person, place, and time. She appears well-developed and well-nourished. No distress.  HENT:  Head: Normocephalic and atraumatic.  Eyes: Pupils are equal, round, and reactive to light.  Neck: Normal range of motion.  Cardiovascular: Normal rate and regular rhythm.   Respiratory: Breath sounds normal. She  is in respiratory distress.  GI: Soft. Bowel sounds are normal. She exhibits no distension. There is no tenderness. There is no rebound.  Musculoskeletal: Normal range of motion.  Neurological: She is alert and oriented to person, place, and time.  Skin: Skin is warm and dry. She is not diaphoretic.     Assessment/Plan To OR for ileostomy reversal  Victoria [name] C. 01/04/4981, 6:41 AM

## 2014-06-07 NOTE — Anesthesia Postprocedure Evaluation (Signed)
  Anesthesia Post-op Note  Patient: Victoria [name]  Procedure(s) Performed: Procedure(s) (LRB): ILEOSTOMY reversal  (N/A)  Patient Location: PACU  Anesthesia Type: General  Level of Consciousness: awake and alert   Airway and Oxygen Therapy: Patient Spontanous Breathing  Post-op Pain: mild  Post-op Assessment: Post-op Vital signs reviewed, Patient's Cardiovascular Status Stable, Respiratory Function Stable, Patent Airway and No signs of Nausea or vomiting  Last Vitals:  Filed Vitals:   06/07/14 1015  BP: 172/99  Pulse: 57  Temp: 36.4 C  Resp: 30    Post-op Vital Signs: stable   Complications: No apparent anesthesia complications

## 2014-06-07 NOTE — Transfer of Care (Signed)
Immediate Anesthesia Transfer of Care Note  Patient: Victoria [name]  Procedure(s) Performed: Procedure(s): ILEOSTOMY reversal  (N/A)  Patient Location: PACU  Anesthesia Type:General  Level of Consciousness: awake, alert  and oriented  Airway & Oxygen Therapy: Patient Spontanous Breathing and Patient connected to face mask oxygen  Post-op Assessment: Report given to PACU RN and Post -op Vital signs reviewed and stable  Post vital signs: Reviewed and stable  Complications: No apparent anesthesia complications

## 2014-06-07 NOTE — Anesthesia Preprocedure Evaluation (Addendum)
Anesthesia Evaluation  Patient identified by MRN, date of birth, ID band Patient awake    Reviewed: Allergy & Precautions, H&P , NPO status , Patient's Chart, lab work & pertinent test results  Airway Mallampati: II TM Distance: >3 FB Neck ROM: Full    Dental no notable dental hx.    Pulmonary neg pulmonary ROS,  breath sounds clear to auscultation  Pulmonary exam normal       Cardiovascular DVT Rhythm:Regular Rate:Normal     Neuro/Psych negative neurological ROS  negative psych ROS   GI/Hepatic negative GI ROS, Neg liver ROS,   Endo/Other  negative endocrine ROS  Renal/GU negative Renal ROS  negative genitourinary   Musculoskeletal negative musculoskeletal ROS (+)   Abdominal   Peds negative pediatric ROS (+)  Hematology negative hematology ROS (+)   Anesthesia Other Findings   Reproductive/Obstetrics negative OB ROS                           Anesthesia Physical Anesthesia Plan  ASA: II  Anesthesia Plan: General   Post-op Pain Management:    Induction: Intravenous  Airway Management Planned: Oral ETT  Additional Equipment:   Intra-op Plan:   Post-operative Plan: Extubation in OR  Informed Consent: I have reviewed the patients History and Physical, chart, labs and discussed the procedure including the risks, benefits and alternatives for the proposed anesthesia with the patient or authorized representative who has indicated his/her understanding and acceptance.   Dental advisory given  Plan Discussed with: CRNA and Surgeon  Anesthesia Plan Comments:         Anesthesia Quick Evaluation

## 2014-06-07 NOTE — Op Note (Signed)
06/07/2014  8:55 AM  PATIENT:  Victoria [name]  63 y.o. female  Patient Care Team: Irven Shelling, MD as PCP - General (Internal Medicine) Carola Frost, RN as Registered Nurse Ladell Pier, MD as Consulting Physician (Oncology)  PRE-OPERATIVE DIAGNOSIS:  low rectal cancer   POST-OPERATIVE DIAGNOSIS:  Low rectal cancer  PROCEDURE:  ILEOSTOMY reversal   SURGEON:  Surgeon(s): Leighton Ruff, MD Earnstine Regal, MD  ASSISTANT: Harlow Asa   ANESTHESIA:   general  EBL: 37ml  Total I/O In: 1000 [I.V.:1000] Out: -   DRAINS: none   SPECIMEN:  Source of Specimen:  ileostomy  DISPOSITION OF SPECIMEN:  PATHOLOGY  COUNTS:  YES  PLAN OF CARE: Admit to inpatient   PATIENT DISPOSITION:  PACU - hemodynamically stable.  INDICATION: this is a 63 year old female who presents with a diverting loop ileostomy after coloanal anastomosis for low rectal cancer. The risk and benefits of the procedure were splinter the patient prior the OR consent was signed and placed on chart.   OR FINDINGS: ileostomy in place  DESCRIPTION: the patient was identified in the preoperative holding area and taken to the OR where they were laid supine on the operating room table.  Gen. anesthesia was induced without difficulty. SCDs were also noted to be in place prior to the initiation of anesthesia.  The patient was then prepped and draped in the usual sterile fashion.   A surgical timeout was performed indicating the correct patient, procedure, positioning and need for preoperative antibiotics.   I began by making an incision at the mucocutaneous junction using electrocautery. Dissection was carried down through the subcutaneous tissues freeing up the ileostomy from the subcutaneous fat. The fascia was identified and a plane was developed between the fascia and the ileostomy. After this the ileostomy was separated from the fascia and elevated out of the wound. Enterotomies were made in the 2 loops of the  ileostomy just below the level of the fascial connection. A GIA blue load a 75 mm stapler was inserted and an anastomosis was created. A TA blue load 60 mm stapler was used to close the common defect. After this was completed the staple line was imbricated using interrupted 3-0 silk sutures. Hemostasis was also achieved using interrupted 3-0 silk sutures. An anti-tension suture was placed also using 3-0 silk suture. The patient's ileostomy was then placed back into the abdomen. The fascia was closed using interrupted #1 Novafil sutures. The subcutaneous tissue was closed in 2 layers using 2 pursestring 2-0 Vicryl sutures. A Telfa wick was placed in the middle and a sterile dressing was applied. The patient was awakened from anesthesia and sent to the postanesthesia care unit in stable condition. All counts were correct per operating room staff.

## 2014-06-08 ENCOUNTER — Encounter (HOSPITAL_COMMUNITY): Payer: Self-pay | Admitting: General Surgery

## 2014-06-08 LAB — BASIC METABOLIC PANEL
Anion gap: 10 (ref 5–15)
BUN: 14 mg/dL (ref 6–23)
CO2: 26 mEq/L (ref 19–32)
Calcium: 9.2 mg/dL (ref 8.4–10.5)
Chloride: 102 mEq/L (ref 96–112)
Creatinine, Ser: 1.18 mg/dL — ABNORMAL HIGH (ref 0.50–1.10)
GFR, EST AFRICAN AMERICAN: 56 mL/min — AB (ref >90)
GFR, EST NON AFRICAN AMERICAN: 49 mL/min — AB (ref >90)
GLUCOSE: 111 mg/dL — AB (ref 70–99)
POTASSIUM: 4.2 meq/L (ref 3.7–5.3)
SODIUM: 138 meq/L (ref 137–147)

## 2014-06-08 LAB — CBC
HCT: 30.2 % — ABNORMAL LOW (ref 36.0–46.0)
Hemoglobin: 10.1 g/dL — ABNORMAL LOW (ref 12.0–15.0)
MCH: 30 pg (ref 26.0–34.0)
MCHC: 33.4 g/dL (ref 30.0–36.0)
MCV: 89.6 fL (ref 78.0–100.0)
PLATELETS: 223 10*3/uL (ref 150–400)
RBC: 3.37 MIL/uL — AB (ref 3.87–5.11)
RDW: 14.8 % (ref 11.5–15.5)
WBC: 4.7 10*3/uL (ref 4.0–10.5)

## 2014-06-08 MED ORDER — BOOST / RESOURCE BREEZE PO LIQD
1.0000 | ORAL | Status: DC
  Administered 2014-06-08 – 2014-06-09 (×2): 1 via ORAL

## 2014-06-08 NOTE — Progress Notes (Signed)
1 Day Post-Op loop ileostomy reversal Subjective: Doing well.  No pain overnight.  No nausea  Objective: Vital signs in last 24 hours: Temp:  [97.3 F (36.3 C)-98.1 F (36.7 C)] 97.9 F (36.6 C) (07/10 0542) Pulse Rate:  [54-70] 70 (07/10 0542) Resp:  [16-31] 16 (07/10 0542) BP: (109-191)/(56-111) 129/66 mmHg (07/10 0542) SpO2:  [98 %-100 %] 98 % (07/10 0542) Weight:  [163 lb (73.936 kg)] 163 lb (73.936 kg) (07/09 1129)   Intake/Output from previous day: 07/09 0701 - 07/10 0700 In: 2722.5 [I.V.:2672.5; IV Piggyback:50] Out: 2020 [Urine:2010; Blood:10] Intake/Output this shift:     General appearance: alert and cooperative GI: normal findings: soft, non-tender  Incision: bloody drainage on dressing, expected  Lab Results:   Recent Labs  06/08/14 0520  WBC 4.7  HGB 10.1*  HCT 30.2*  PLT 223   BMET  Recent Labs  06/08/14 0520  NA 138  K 4.2  CL 102  CO2 26  GLUCOSE 111*  BUN 14  CREATININE 1.18*  CALCIUM 9.2   PT/INR No results found for this basename: LABPROT, INR,  in the last 72 hours ABG No results found for this basename: PHART, PCO2, PO2, HCO3,  in the last 72 hours  MEDS, Scheduled . cycloSPORINE  1 drop Both Eyes BID  . enoxaparin  100 mg Subcutaneous Q24H    Studies/Results: No results found.  Assessment: s/p Procedure(s): ILEOSTOMY reversal  Patient Active Problem List   Diagnosis Date Noted  . 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 Rectal cancer uT3uN1 s/p lap LAR/loop ileostomy 09/27/2013    Expected post op course  Plan: d/c foley Advance diet to clears,  UOP good: minimize IVF's, monitor Cr Restart therapeutic Lovenox for DVT, will monitor Hgb   LOS: 1 day     .Rosario Adie, [facility], [place]   06/08/2014 7:27 AM

## 2014-06-08 NOTE — Progress Notes (Signed)
Nutrition Note  Pt advanced to clear liquid. Ordered [employer] once daily. Will continue to monitor per protocols.  Atlee Abide MS RD LDN Clinical Dietitian [phone]

## 2014-06-08 NOTE — Plan of Care (Signed)
Problem: Phase II Progression Outcomes Goal: Foley discontinued Foley removed at 0745.  Patient tolerated well.  Patient now due to void.

## 2014-06-08 NOTE — Progress Notes (Signed)
Patient c/o increased abdominal gas pain.  Encouraged patient to walk.  Patient then had a medium sized brown watery stool and passed some flatus.  Patient's pain now resolved.  Zandra Abts [facility]  06/08/2014

## 2014-06-08 NOTE — Care Management Note (Signed)
    Page 1 of 1   06/08/2014     10:16:22 AM CARE MANAGEMENT NOTE 06/08/2014  Patient:  Victoria [name], Victoria [name]   Account Number:  [id number]  Date Initiated:  06/08/2014  Documentation initiated by:  Sunday Spillers  Subjective/Objective Assessment:   63 yo female admitted s/p ileostomy reversal. PTA lived at home with spouse.     Action/Plan:   Home when stable   Anticipated DC Date:  06/11/2014   Anticipated DC Plan:  [place]  CM consult      Choice offered to / List presented to:             Status of service:  Completed, signed off Medicare Important Message given?  NA - LOS <3 / Initial given by admissions (If response is "NO", the following Medicare IM given date fields will be blank) Date Medicare IM given:   Medicare IM given by:   Date Additional Medicare IM given:   Additional Medicare IM given by:    Discharge Disposition:  HOME/SELF CARE  Per UR Regulation:  Reviewed for med. necessity/level of care/duration of stay  If discussed at [place] of Stay Meetings, dates discussed:    Comments:

## 2014-06-09 LAB — BASIC METABOLIC PANEL
ANION GAP: 12 (ref 5–15)
BUN: 12 mg/dL (ref 6–23)
CHLORIDE: 102 meq/L (ref 96–112)
CO2: 27 mEq/L (ref 19–32)
Calcium: 9 mg/dL (ref 8.4–10.5)
Creatinine, Ser: 1.16 mg/dL — ABNORMAL HIGH (ref 0.50–1.10)
GFR calc Af Amer: 58 mL/min — ABNORMAL LOW (ref >90)
GFR calc non Af Amer: 50 mL/min — ABNORMAL LOW (ref >90)
Glucose, Bld: 96 mg/dL (ref 70–99)
Potassium: 4.2 mEq/L (ref 3.7–5.3)
SODIUM: 141 meq/L (ref 137–147)

## 2014-06-09 LAB — CBC
HCT: 29.3 % — ABNORMAL LOW (ref 36.0–46.0)
HEMOGLOBIN: 9.9 g/dL — AB (ref 12.0–15.0)
MCH: 29.9 pg (ref 26.0–34.0)
MCHC: 33.8 g/dL (ref 30.0–36.0)
MCV: 88.5 fL (ref 78.0–100.0)
Platelets: 229 10*3/uL (ref 150–400)
RBC: 3.31 MIL/uL — AB (ref 3.87–5.11)
RDW: 14.5 % (ref 11.5–15.5)
WBC: 4.5 10*3/uL (ref 4.0–10.5)

## 2014-06-09 NOTE — Progress Notes (Signed)
Patient ID: Victoria [name], female   DOB: 1951-03-21, 63 y.o.   MRN: [id number] 2 Days Post-Op  Subjective: Also at the incision but no other complaints. Tolerating clear liquids. Has had flatus.  Objective: Vital signs in last 24 hours: Temp:  [97.5 F (36.4 C)-98.1 F (36.7 C)] 98 F (36.7 C) (07/11 0546) Pulse Rate:  [67-68] 68 (07/11 0546) Resp:  [16-18] 18 (07/11 0546) BP: (129-144)/(84-89) 133/89 mmHg (07/11 0546) SpO2:  [97 %-99 %] 98 % (07/11 0546) Last BM Date: 06/08/14  Intake/Output from previous day: 07/10 0701 - 07/11 0700 In: 1957.9 [P.O.:720; I.V.:1237.9] Out: 1675 [Urine:1675] Intake/Output this shift:    General appearance: alert, cooperative and no distress GI: normal findings: soft, non-tender Incision/Wound: dressing clean and dry  Lab Results:   Recent Labs  06/08/14 0520 06/09/14 0456  WBC 4.7 4.5  HGB 10.1* 9.9*  HCT 30.2* 29.3*  PLT 223 229   BMET  Recent Labs  06/08/14 0520 06/09/14 0456  NA 138 141  K 4.2 4.2  CL 102 102  CO2 26 27  GLUCOSE 111* 96  BUN 14 12  CREATININE 1.18* 1.16*  CALCIUM 9.2 9.0     Studies/Results: No results found.  Anti-infectives: Anti-infectives   Start     Dose/Rate Route Frequency Ordered Stop   06/07/14 1400  cefOXitin (MEFOXIN) 1 g in dextrose 5 % 50 mL IVPB     1 g 100 mL/hr over 30 Minutes Intravenous 4 times per day 06/07/14 1208 06/07/14 1332      Assessment/Plan: s/p Procedure(s): ILEOSTOMY reversal  Doing well without apparent complication. Advanced to full liquid diet. Anticipating discharge tomorrow.   LOS: 2 days    Tymel Conely T 06/09/2014

## 2014-06-10 LAB — CBC
HEMATOCRIT: 30.6 % — AB (ref 36.0–46.0)
HEMOGLOBIN: 9.9 g/dL — AB (ref 12.0–15.0)
MCH: 29.2 pg (ref 26.0–34.0)
MCHC: 32.4 g/dL (ref 30.0–36.0)
MCV: 90.3 fL (ref 78.0–100.0)
Platelets: 249 10*3/uL (ref 150–400)
RBC: 3.39 MIL/uL — AB (ref 3.87–5.11)
RDW: 14.4 % (ref 11.5–15.5)
WBC: 4.3 10*3/uL (ref 4.0–10.5)

## 2014-06-10 LAB — BASIC METABOLIC PANEL
Anion gap: 11 (ref 5–15)
BUN: 10 mg/dL (ref 6–23)
CHLORIDE: 101 meq/L (ref 96–112)
CO2: 27 meq/L (ref 19–32)
CREATININE: 1.19 mg/dL — AB (ref 0.50–1.10)
Calcium: 9.3 mg/dL (ref 8.4–10.5)
GFR calc non Af Amer: 48 mL/min — ABNORMAL LOW (ref >90)
GFR, EST AFRICAN AMERICAN: 56 mL/min — AB (ref >90)
GLUCOSE: 93 mg/dL (ref 70–99)
POTASSIUM: 4.3 meq/L (ref 3.7–5.3)
Sodium: 139 mEq/L (ref 137–147)

## 2014-06-10 MED ORDER — OXYCODONE-ACETAMINOPHEN 5-325 MG PO TABS: 1.0000 | ORAL_TABLET | ORAL | Status: DC | PRN

## 2014-06-10 NOTE — Progress Notes (Signed)
General Surgery Note  LOS: 3 days  POD -  3 Days Post-Op  Assessment/Plan: 1.  ILEOSTOMY reversal - 06/07/2014 - [facility]. [facility]  For LAR from 01/12/2014  Doing well and discharge instructions reviewed.  2.  DVT prophylaxis - Lovenox 3.  Indeterminate right lung nodule on CT 09/29/2013.  4.  Left leg DVT 03/15/2014.   To continue Lovenox at home - the patient has supplies   Active Problems:   Rectal cancer uT3uN1 s/p lap LAR/loop ileostomy  Subjective:  Doing well.  Had BM.  Husband in room.  She is ready to go home.  Instructions reviewed. Objective:   Filed Vitals:   06/10/14 0609  BP: 138/92  Pulse: 69  Temp: 97.9 F (36.6 C)  Resp: 18     Intake/Output from previous day:  07/11 0701 - 07/12 0700 In: 1920 [P.O.:720; I.V.:1200] Out: 2000 [Urine:2000]  Intake/Output this shift:      Physical Exam:   General: WN AA F who is alert and oriented.    HEENT: Normal. Pupils equal. .   Lungs: Clear.   Abdomen: Soft   Wound: Clean.  I removed wick.   Lab Results:    Recent Labs  06/09/14 0456 06/10/14 0529  WBC 4.5 4.3  HGB 9.9* 9.9*  HCT 29.3* 30.6*  PLT 229 249    BMET   Recent Labs  06/09/14 0456 06/10/14 0529  NA 141 139  K 4.2 4.3  CL 102 101  CO2 27 27  GLUCOSE 96 93  BUN 12 10  CREATININE 1.16* 1.19*  CALCIUM 9.0 9.3    PT/INR  No results found for this basename: LABPROT, INR,  in the last 72 hours  ABG  No results found for this basename: PHART, PCO2, PO2, HCO3,  in the last 72 hours   Studies/Results:  No results found.   Anti-infectives:   Anti-infectives   Start     Dose/Rate Route Frequency Ordered Stop   06/07/14 1400  cefOXitin (MEFOXIN) 1 g in dextrose 5 % 50 mL IVPB     1 g 100 mL/hr over 30 Minutes Intravenous 4 times per day 06/07/14 1208 06/07/14 1332      Alphonsa Overall, MD, FACS Pager: [facility] Office: [phone] 06/10/2014

## 2014-06-10 NOTE — Discharge Instructions (Signed)
[facility] - DISCHARGE INSTRUCTIONS TO PATIENT  Activity:  Driving - May drive in 3 or 4 days, if doing well.   Lifting - No lifting more than 15 pounds for one week, then no limit  Wound Care:   May shower.  Change right abdominal dressing as needed.  Diet:  As tolerated.  Follow up appointment:  Call Dr. Manon Hilding office [facility]) at [phone] for an appointment in 2 to 3 weeks.  Medications and dosages:  Resume your home medications.  You have a prescription for:  Percocet           Continue the Lovenox at home.  Call Dr. Marcello Moores or her office  [phone]) if you have:  Temperature greater than 100.4,  Persistent nausea and vomiting,  Severe uncontrolled pain,  Redness, tenderness, or signs of infection (pain, swelling, redness, odor or green/yellow discharge around the site),  Difficulty breathing, headache or visual disturbances,  Any other questions or concerns you may have after discharge.  In an emergency, call 911 or go to an Emergency Department at a nearby hospital.

## 2014-06-10 NOTE — Progress Notes (Signed)
Assessment unchanged. Pt and husband verbalized understanding of dc instructions through teach back. Script given as provided by MD. Discharged via wc to front entrance to meet husband and awaiting vehicle to carry home. Accompanied by NT.

## 2014-06-11 ENCOUNTER — Ambulatory Visit: Payer: BC Managed Care – PPO | Admitting: Nurse Practitioner

## 2014-06-11 ENCOUNTER — Other Ambulatory Visit: Payer: BC Managed Care – PPO

## 2014-06-12 ENCOUNTER — Other Ambulatory Visit: Payer: Self-pay | Admitting: Oncology

## 2014-06-13 NOTE — Discharge Summary (Signed)
Physician Discharge Summary  Patient ID: Victoria [name] MRN: [id number] DOB/AGE: 05/28/1951 63 y.o.  Admit date: 06/07/2014 Discharge date: 06/10/2014  Admission Diagnoses: ileostomy present  Discharge Diagnoses:  Active Problems:   Rectal cancer uT3uN1 s/p lap LAR/loop ileostomy   Discharged Condition: good  Hospital Course: Patient was admitted after reversal of ileostomy.  She did well post operatively.  Her diet was advanced slowly.  She was in stable condition for d/c home on POD 3.    Consults: None  Significant Diagnostic Studies: labs: cbc, chemistry  Treatments: surgery: as above  Discharge Exam: Blood pressure 138/92, pulse 69, temperature 97.9 F (36.6 C), temperature source Oral, resp. rate 18, height 5\' 5"  (1.651 m), weight 163 lb (73.936 kg), SpO2 98.00%. General appearance: alert and cooperative Incision/Wound: clean  Disposition: 01-Home or Self Care  Discharge Instructions   Diet - low sodium heart healthy    Complete by:  As directed      Increase activity slowly    Complete by:  As directed             Medication List         ASA-APAP-Caff Buffered 227-194-33 MG Tabs  Take 1 tablet by mouth every 6 (six) hours as needed (pain).     capecitabine 500 MG tablet  Commonly known as:  XELODA  Take 3 tablets (1,500 mg total) by mouth 2 (two) times daily after a meal.     cycloSPORINE 0.05 % ophthalmic emulsion  Commonly known as:  RESTASIS  Place 1 drop into both eyes 2 (two) times daily.     enoxaparin 100 MG/ML injection  Commonly known as:  LOVENOX  Inject 100 mg into the skin daily. States LAST DOSE WILL BE  06/05/14     loperamide 2 MG capsule  Commonly known as:  IMODIUM  Take 2 mg by mouth 3 (three) times daily as needed for diarrhea or loose stools.     oxyCODONE-acetaminophen 5-325 MG per tablet  Commonly known as:  PERCOCET/ROXICET  Take 1 tablet by mouth every 4 (four) hours as needed for moderate pain.     psyllium 58.6 %  powder  Commonly known as:  METAMUCIL  Take 1 packet by mouth daily as needed. constipation         Signed: Rosario Adie 03/27/7680, 1:57 AM

## 2014-06-19 ENCOUNTER — Encounter (INDEPENDENT_AMBULATORY_CARE_PROVIDER_SITE_OTHER): Payer: Self-pay | Admitting: General Surgery

## 2014-06-19 ENCOUNTER — Telehealth: Payer: Self-pay | Admitting: Nurse Practitioner

## 2014-06-19 ENCOUNTER — Telehealth: Payer: Self-pay | Admitting: *Deleted

## 2014-06-19 ENCOUNTER — Ambulatory Visit (INDEPENDENT_AMBULATORY_CARE_PROVIDER_SITE_OTHER): Payer: BC Managed Care – PPO | Admitting: General Surgery

## 2014-06-19 VITALS — BP 128/82 | HR 72 | Temp 98.6°F | Resp 18 | Ht 65.0 in | Wt 161.4 lb

## 2014-06-19 DIAGNOSIS — Z9889 Other specified postprocedural states: Secondary | ICD-10-CM

## 2014-06-19 NOTE — Telephone Encounter (Signed)
Pt's husband called reports "she is have pain in her right leg (the leg that doesn't have the blood clot) and she is having pain from her knee down to her ankle and her entire foot is tingling." Pt currently on Lovenox injections.  Pt missed 7/13 appt d/t surgery.  Note to Dr. Benay Spice.

## 2014-06-19 NOTE — Telephone Encounter (Signed)
Correction:  Appt with Selena Lesser 06/20/14 @ 10 am.  Pt verbalized understanding.

## 2014-06-19 NOTE — Telephone Encounter (Signed)
Per Dr. Benay Spice; pt informed to come in 06/20/13 for Victoria Lesser, NP to assess right leg.  Pt verbalized understanding and confirmed she will be here.

## 2014-06-19 NOTE — Progress Notes (Signed)
Victoria [name] is a 63 y.o. female who is here for a follow up visit regarding her ileostomy reversal.  She is doing well.  She denies rectal bleeding or urgency.  She is having some right leg pain.  Objective: Filed Vitals:   06/19/14 0927  BP: 128/82  Pulse: 72  Temp: 98.6 F (37 C)  Resp: 18    General appearance: alert and cooperative GI: normal findings: soft, non-tender Inc: healing well  Assessment and Plan: Return to the office in 3 months.  Pt has many questions about Lovenox and will call Dr Gearldine Shown office about this.  I have asked her to see her PCP as well to get caught up with him.    Rosario Adie, MD [facility], [place]

## 2014-06-19 NOTE — Patient Instructions (Signed)
Return to the office in 3 weeks.

## 2014-06-19 NOTE — Telephone Encounter (Signed)
Per 07/21 POF scheduled office visit...Marland KitchenMarland KitchenKJ

## 2014-06-20 ENCOUNTER — Other Ambulatory Visit: Payer: Self-pay | Admitting: *Deleted

## 2014-06-20 ENCOUNTER — Ambulatory Visit (HOSPITAL_BASED_OUTPATIENT_CLINIC_OR_DEPARTMENT_OTHER): Payer: BC Managed Care – PPO | Admitting: Nurse Practitioner

## 2014-06-20 ENCOUNTER — Other Ambulatory Visit (HOSPITAL_COMMUNITY): Payer: Self-pay | Admitting: Nurse Practitioner

## 2014-06-20 ENCOUNTER — Encounter: Payer: Self-pay | Admitting: Nurse Practitioner

## 2014-06-20 ENCOUNTER — Ambulatory Visit (HOSPITAL_COMMUNITY)
Admission: RE | Admit: 2014-06-20 | Discharge: 2014-06-20 | Disposition: A | Discharge status: Home / Self Care | Payer: BC Managed Care – PPO | Source: Ambulatory Visit | Attending: Oncology | Admitting: Oncology

## 2014-06-20 VITALS — BP 149/83 | HR 74 | Temp 99.3°F | Resp 18 | Ht 65.0 in | Wt 161.7 lb

## 2014-06-20 DIAGNOSIS — C20 Malignant neoplasm of rectum: Secondary | ICD-10-CM | POA: Diagnosis not present

## 2014-06-20 DIAGNOSIS — Z86718 Personal history of other venous thrombosis and embolism: Secondary | ICD-10-CM | POA: Diagnosis not present

## 2014-06-20 DIAGNOSIS — R599 Enlarged lymph nodes, unspecified: Secondary | ICD-10-CM | POA: Diagnosis not present

## 2014-06-20 DIAGNOSIS — I82409 Acute embolism and thrombosis of unspecified deep veins of unspecified lower extremity: Secondary | ICD-10-CM | POA: Insufficient documentation

## 2014-06-20 DIAGNOSIS — M79609 Pain in unspecified limb: Secondary | ICD-10-CM

## 2014-06-20 DIAGNOSIS — I82402 Acute embolism and thrombosis of unspecified deep veins of left lower extremity: Secondary | ICD-10-CM

## 2014-06-20 DIAGNOSIS — Z932 Ileostomy status: Secondary | ICD-10-CM

## 2014-06-20 DIAGNOSIS — M79604 Pain in right leg: Secondary | ICD-10-CM

## 2014-06-20 DIAGNOSIS — M79606 Pain in leg, unspecified: Secondary | ICD-10-CM | POA: Insufficient documentation

## 2014-06-20 NOTE — Progress Notes (Signed)
Right lower extremity venous duplex completed.  Right:  No evidence of DVT, superficial thrombosis, or Baker's cyst.  Left:  Negative for DVT in the common femoral vein.  

## 2014-06-20 NOTE — Progress Notes (Signed)
[place]   Chief Complaint  Patient presents with  . Leg Pain    HPI: Victoria [name] 63 y.o. female diagnosed with rectal cancer.  She is status post Xeloda; which she completed in early June 2015.  She underwent an ileostomy reversal on 36/64/4034 per Dr. Leighton Ruff.  The patient called the cancer center today requesting urgent care visit.  She states she has been experiencing some right upper extremity anterior shin area pain for 2 weeks.  She denies any known injury or trauma to this area.  She denies any specific calf tenderness.  She denies any worsening of pain with exertion or ambulation.  She states that she has been taking some occasional oxycodone to manage her pain.  Patient was diagnosed with a previous left lower extremity DVT in the past.  She remains on Lovenox injections on a daily basis.  Patient continues to deny any chest pain/chest pressure/shortness of breath.  Chills denies any recent fever or chills  Past Medical History  Diagnosis Date  . Medical history non-contributory   . HDL deficiency   . Allergy     seasonal allergic rhinitis  . History of radiation therapy 10/16/13-11/27/13    rectal 50.4Gy  . Cancer 09/19/13    rectum  . Peripheral vascular disease 4/15    DVT LEFT LEG    Past Surgical History  Procedure Laterality Date  . Left leg surgery  1986    rod from hip to knee  . Colonoscopy with propofol N/A 09/19/2013    Procedure: COLONOSCOPY WITH PROPOFOL;  Surgeon: Garlan Fair, MD;  Location: [facility];  Service: Endoscopy;  Laterality: N/A;  . Femur fracture surgery Left 1985    steel rod  . Eus N/A 09/27/2013    Procedure: LOWER ENDOSCOPIC ULTRASOUND (EUS);  Surgeon: Arta Silence, MD;  Location: [facility];  Service: Endoscopy;  Laterality: N/A;  . Laparoscopic low anterior resection N/A 01/12/2014    Procedure: LAPAROSCOPIC LOW ANTERIOR RESECTION ;  Surgeon: Leighton Ruff, MD;  Location: [facility];   Service: General;  Laterality: N/A;  . Ileo loop diversion N/A 01/12/2014    Procedure:   LOOP ILEOSTOMY;  Surgeon: Leighton Ruff, MD;  Location: [facility];  Service: General;  Laterality: N/A;  . 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     is allergic to ointment base.    Medication List       This list is accurate as of: 06/20/14  1:55 PM.  Always use your most recent med list.               ASA-APAP-Caff Buffered 227-194-33 MG Tabs  Take 1 tablet by mouth every 6 (six) hours as needed (pain).     capecitabine 500 MG tablet  Commonly known as:  XELODA  Take 3 tablets (1,500 mg total) by mouth 2 (two) times daily after a meal.     cycloSPORINE 0.05 % ophthalmic emulsion  Commonly known as:  RESTASIS  Place 1 drop into both eyes 2 (two) times daily.     enoxaparin 100 MG/ML injection  Commonly known as:  LOVENOX  INJECT 1 ML (100 MG TOTAL) INTO THE SKIN DAILY.  loperamide 2 MG capsule  Commonly known as:  IMODIUM  Take 2 mg by mouth 3 (three) times daily as needed for diarrhea or loose stools.     oxyCODONE-acetaminophen 5-325 MG per tablet  Commonly known as:  PERCOCET/ROXICET  Take 1 tablet by mouth every 4 (four) hours as needed for moderate pain.     psyllium 58.6 % powder  Commonly known as:  METAMUCIL  Take 1 packet by mouth daily as needed. constipation          PHYSICAL EXAMINATION  Blood pressure 149/83, pulse 74, temperature 99.3 F (37.4 C), temperature source Oral, resp. rate 18, height 5\' 5"  (1.651 m), weight 161 lb 11.2 oz (73.347 kg).  GENERAL:alert, healthy, no distress, well nourished and well developed SKIN: skin color, texture, turgor are normal, no rashes  or significant lesions HEAD: Normocephalic, No masses, lesions, tenderness or abnormalities EYES: PERRLA, Conjunctiva are pink and non-injected LUNGS: negative findings:  normal respiratory rate and rhythm, lungs clear to auscultation HEART: regular rate & rhythm, no murmurs and no gallops ABDOMEN:abdomen soft, non-tender, obese, normal bowel sounds and no masses or organomegaly BACK: No CVA tenderness, Range of motion is normal EXTREMITIES:no edema, no clubbing, no cyanosis, right lower extremity with tenderness with palpation.  No evidence of edema, warmth, erythema, or red streaks.  Patient is noted have full range of motion with all extremities.  Patient is angulated with no difficulty whatsoever.  All pulses are palpable in all extremities were warm.  NEURO: alert & oriented x 3 with fluent speech, gait normal  RADIOGRAPHIC STUDIES: Patient Information    Patient Name Sex DOB SSN   Victoria [name], Victoria [name] Female 1951-01-10 [id number]            Progress Notes by Charlaine Dalton, RVT at 06/20/2014 11:33 AM    Author: Charlaine Dalton, RVT Service: Vascular Lab Author Type: Cardiovascular Sonographer   Filed: 06/20/2014 11:34 AM Note Time: 06/20/2014 11:33 AM Status: Signed   Editor: Charlaine Dalton, RVT (Cardiovascular Sonographer)      Right lower extremity venous duplex completed. Right: No evidence of DVT, superficial thrombosis, or Baker's cyst. Left: Negative for DVT in the common femoral vein.    ASSESSMENT/PLAN:    Rectal cancer uT3uN1 s/p lap LAR/loop ileostomy  Assessment & Plan Patient completed Xeloda in early June 2015.  He does appear to be recovering from her ileostomy reversal which was performed approximately 2 weeks ago per Dr. Leighton Ruff.  Patient will return in approximately 3 months for a followup visit.  At that time she will need labs drawn which will include a CBC with differential, a complete metabolic panel, and CEA.     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 Leighton Ruff.  She did just have a follow up appointment with Dr. Marcello Moores yesterday 06/19/2014.  Patient states that she has her next followup appointment with her surgeon in approximately 3 months.   Lower extremity pain  Assessment & Plan Did obtain a Doppler ultrasound of the right lower extremity; which was negative for DVT.  Patient continues to deny any known injury or trauma to that right lower leg.  This could perhaps be some vague post-Xeloda neuropathic pain.  Advised patient to let us know if this pain continues or worsens.   DVT (deep venous thrombosis)  Assessment & Plan Patient was diagnosed in the past  with a left lower extremity DVT.  Patient remains on Lovenox injections on a daily basis.   Patient stated understanding of all instructions; and was in agreement with this plan of care. The patient knows to call the clinic with any problems, questions or concerns.   Review/collaboration with Dr. Benay Spice regarding all aspects of patient's visit today.   Total time spent with patient was 25 minutes;  with greater than 75 percent of that time spent in face to face counseling regarding her symptoms, review of the ultrasound results, and coordination of care and follow up.  Disclaimer: This note was dictated with voice recognition software. Similar sounding words can inadvertently be transcribed and may not be corrected upon review.   Drue Second, NP 06/20/2014

## 2014-06-20 NOTE — Assessment & Plan Note (Signed)
Did obtain a Doppler ultrasound of the right lower extremity; which was negative for DVT.  Patient continues to deny any known injury or trauma to that right lower leg.  This could perhaps be some vague post-Xeloda neuropathic pain.  Advised patient to let us know if this pain continues or worsens.

## 2014-06-20 NOTE — Assessment & Plan Note (Signed)
The patient did have a reversal of her ileostomy approximate 2 weeks ago per Dr. Leighton Ruff.  She did just have a follow up appointment with Dr. Marcello Moores yesterday 06/19/2014.  Patient states that she has her next followup appointment with her surgeon in approximately 3 months.

## 2014-06-20 NOTE — Assessment & Plan Note (Signed)
Patient was diagnosed in the past with a left lower extremity DVT.  Patient remains on Lovenox injections on a daily basis.

## 2014-06-20 NOTE — Assessment & Plan Note (Signed)
Patient completed Xeloda in early June 2015.  He does appear to be recovering from her ileostomy reversal which was performed approximately 2 weeks ago per Dr. Leighton Ruff.  Patient will return in approximately 3 months for a followup visit.  At that time she will need labs drawn which will include a CBC with differential, a complete metabolic panel, and CEA.

## 2014-06-20 NOTE — Progress Notes (Signed)
Abdominal surgical site with dressing intact.   Advised patient to call if pain worsens; or does not resolve. May want to consider PT or ortho referral if no improvement. Patient stated that she prefers to recover from her surgery prior to considering PT referral. She stated that she would try to slowly increase her activity level; and walk more each day.

## 2014-06-22 ENCOUNTER — Telehealth: Payer: Self-pay | Admitting: Oncology

## 2014-06-22 NOTE — Telephone Encounter (Signed)
s.w.l pt and advised on OCT appt...pt ok and aware

## 2014-06-25 ENCOUNTER — Other Ambulatory Visit: Payer: Self-pay | Admitting: *Deleted

## 2014-06-25 NOTE — Telephone Encounter (Signed)
Notified patient that Dr. Benay Spice does not want to prescribe narcotics for a pain that he does not know cause. He does not think it is related to her Lovenox, however patient still insists that package says it can cause this pain. Instructed her to continue the Aleve and contact Dr. Laurann Montana to be referred to ortho or return to surgeon to discuss this pain.  She is asking how long she will need to be on the Lovenox?

## 2014-06-25 NOTE — Telephone Encounter (Signed)
Aleve has not helped her right leg pain. Is requesting to refill the oxycodone/apap. Requires 2-3 /day to stay comfortable. Requesting refill. Asking if referral to Sports Medicine would be of benefit? Confirmed she is still on her Lovenox daily.

## 2014-06-26 ENCOUNTER — Telehealth (INDEPENDENT_AMBULATORY_CARE_PROVIDER_SITE_OTHER): Payer: Self-pay | Admitting: General Surgery

## 2014-06-26 DIAGNOSIS — C20 Malignant neoplasm of rectum: Secondary | ICD-10-CM

## 2014-06-26 MED ORDER — OXYCODONE-ACETAMINOPHEN 5-325 MG PO TABS: 1.0000 | ORAL_TABLET | ORAL | Status: DC | PRN

## 2014-06-26 NOTE — Telephone Encounter (Addendum)
Husband called for his wife stated that the patient is out of her pain med. Percocet 5/325 and would like to have a refill. Patient call back number is [phone] or [phone]

## 2014-06-26 NOTE — Addendum Note (Signed)
Addended byFlossie Buffy on: 06/26/2014 10:56 AM   Modules accepted: Orders

## 2014-06-26 NOTE — Telephone Encounter (Signed)
Spoke with the husband and informed him that Dr. Ward Givens the refill on the Percocet and that it will be ready for pick up at our front desk.  The husband then explained that his wife is still having calf pain.  He explains that it has been going on since day 3 after surgery.  Dr. Gearldine Shown office sent her for US doppler that came back negative for DVT's.  He denies any redness, warmth, or swelling of the legs.  Informed him to try heat packs to help relax the muscles and prevent muscle contractions.  He explained that they have been doing this but the discomfort comes back once the heat has been removed.  Suggested stretching the calf muscles, continuing the heat, and ibuprofen.  Informed him that he needs to call our office if redness, swelling, or heat become present in the leg.  Pt's husband verbalized understanding and was in agreement with this POC.

## 2014-06-29 ENCOUNTER — Other Ambulatory Visit: Payer: BC Managed Care – PPO

## 2014-06-29 ENCOUNTER — Telehealth: Payer: Self-pay | Admitting: *Deleted

## 2014-06-29 ENCOUNTER — Ambulatory Visit: Payer: BC Managed Care – PPO | Admitting: Nurse Practitioner

## 2014-06-29 NOTE — Telephone Encounter (Signed)
Made husband aware that Dr. Benay Spice thinks she needs to be on the Lovenox for total of 6 months. He relay this to his wife.

## 2014-07-04 ENCOUNTER — Encounter (INDEPENDENT_AMBULATORY_CARE_PROVIDER_SITE_OTHER): Payer: BC Managed Care – PPO | Admitting: General Surgery

## 2014-07-06 ENCOUNTER — Telehealth (INDEPENDENT_AMBULATORY_CARE_PROVIDER_SITE_OTHER): Payer: Self-pay

## 2014-07-06 NOTE — Telephone Encounter (Signed)
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 Pts husband is calling stating that pt has not had a BM the last couple of days. Pt advised to take MOM as directed on the label, make sure that she is drinking plenty of water and moving around as much as possible. Pts husband states that she has also taken a stool softener. Informed husband to give [place] a call back if this continues. Husband verbalized understanding and agrees with POC.

## 2014-07-11 ENCOUNTER — Inpatient Hospital Stay (HOSPITAL_COMMUNITY)
Admission: EM | Admit: 2014-07-11 | Discharge: 2014-07-14 | DRG: 388 | Disposition: A | Discharge status: Home / Self Care | Payer: BC Managed Care – PPO | Attending: Internal Medicine | Admitting: Internal Medicine

## 2014-07-11 ENCOUNTER — Other Ambulatory Visit: Payer: Self-pay | Admitting: Nurse Practitioner

## 2014-07-11 ENCOUNTER — Ambulatory Visit
Admission: RE | Admit: 2014-07-11 | Discharge: 2014-07-11 | Disposition: A | Discharge status: Home / Self Care | Payer: BC Managed Care – PPO | Source: Ambulatory Visit | Attending: Nurse Practitioner | Admitting: Nurse Practitioner

## 2014-07-11 ENCOUNTER — Emergency Department (HOSPITAL_COMMUNITY): Payer: BC Managed Care – PPO

## 2014-07-11 ENCOUNTER — Encounter (HOSPITAL_COMMUNITY): Payer: Self-pay | Admitting: Emergency Medicine

## 2014-07-11 DIAGNOSIS — Z923 Personal history of irradiation: Secondary | ICD-10-CM | POA: Diagnosis not present

## 2014-07-11 DIAGNOSIS — Z6825 Body mass index (BMI) 25.0-25.9, adult: Secondary | ICD-10-CM | POA: Diagnosis not present

## 2014-07-11 DIAGNOSIS — K56609 Unspecified intestinal obstruction, unspecified as to partial versus complete obstruction: Secondary | ICD-10-CM | POA: Diagnosis not present

## 2014-07-11 DIAGNOSIS — Z85048 Personal history of other malignant neoplasm of rectum, rectosigmoid junction, and anus: Secondary | ICD-10-CM | POA: Diagnosis not present

## 2014-07-11 DIAGNOSIS — J189 Pneumonia, unspecified organism: Secondary | ICD-10-CM | POA: Diagnosis present

## 2014-07-11 DIAGNOSIS — C20 Malignant neoplasm of rectum: Secondary | ICD-10-CM | POA: Diagnosis present

## 2014-07-11 DIAGNOSIS — E860 Dehydration: Secondary | ICD-10-CM

## 2014-07-11 DIAGNOSIS — R634 Abnormal weight loss: Secondary | ICD-10-CM | POA: Diagnosis present

## 2014-07-11 DIAGNOSIS — E876 Hypokalemia: Secondary | ICD-10-CM | POA: Diagnosis present

## 2014-07-11 DIAGNOSIS — N179 Acute kidney failure, unspecified: Secondary | ICD-10-CM | POA: Diagnosis present

## 2014-07-11 DIAGNOSIS — R109 Unspecified abdominal pain: Secondary | ICD-10-CM

## 2014-07-11 DIAGNOSIS — E871 Hypo-osmolality and hyponatremia: Secondary | ICD-10-CM | POA: Diagnosis present

## 2014-07-11 DIAGNOSIS — I82409 Acute embolism and thrombosis of unspecified deep veins of unspecified lower extremity: Secondary | ICD-10-CM

## 2014-07-11 DIAGNOSIS — Z23 Encounter for immunization: Secondary | ICD-10-CM | POA: Diagnosis not present

## 2014-07-11 DIAGNOSIS — Z932 Ileostomy status: Secondary | ICD-10-CM

## 2014-07-11 DIAGNOSIS — R627 Adult failure to thrive: Secondary | ICD-10-CM | POA: Diagnosis present

## 2014-07-11 DIAGNOSIS — E43 Unspecified severe protein-calorie malnutrition: Secondary | ICD-10-CM | POA: Diagnosis present

## 2014-07-11 DIAGNOSIS — R112 Nausea with vomiting, unspecified: Secondary | ICD-10-CM

## 2014-07-11 DIAGNOSIS — R0689 Other abnormalities of breathing: Secondary | ICD-10-CM

## 2014-07-11 DIAGNOSIS — E878 Other disorders of electrolyte and fluid balance, not elsewhere classified: Secondary | ICD-10-CM

## 2014-07-11 DIAGNOSIS — K56600 Partial intestinal obstruction, unspecified as to cause: Secondary | ICD-10-CM

## 2014-07-11 DIAGNOSIS — K5669 Other intestinal obstruction: Secondary | ICD-10-CM | POA: Diagnosis not present

## 2014-07-11 LAB — CBC WITH DIFFERENTIAL/PLATELET
BASOS ABS: 0 10*3/uL (ref 0.0–0.1)
Basophils Relative: 0 % (ref 0–1)
Eosinophils Absolute: 0 10*3/uL (ref 0.0–0.7)
Eosinophils Relative: 0 % (ref 0–5)
HCT: 34.5 % — ABNORMAL LOW (ref 36.0–46.0)
Hemoglobin: 11.8 g/dL — ABNORMAL LOW (ref 12.0–15.0)
LYMPHS PCT: 8 % — AB (ref 12–46)
Lymphs Abs: 0.8 10*3/uL (ref 0.7–4.0)
MCH: 28 pg (ref 26.0–34.0)
MCHC: 34.2 g/dL (ref 30.0–36.0)
MCV: 81.8 fL (ref 78.0–100.0)
MONOS PCT: 17 % — AB (ref 3–12)
Monocytes Absolute: 1.6 10*3/uL — ABNORMAL HIGH (ref 0.1–1.0)
Neutro Abs: 7.1 10*3/uL (ref 1.7–7.7)
Neutrophils Relative %: 75 % (ref 43–77)
PLATELETS: 355 10*3/uL (ref 150–400)
RBC: 4.22 MIL/uL (ref 3.87–5.11)
RDW: 14.7 % (ref 11.5–15.5)
WBC: 9.5 10*3/uL (ref 4.0–10.5)

## 2014-07-11 LAB — COMPREHENSIVE METABOLIC PANEL
ALBUMIN: 3.4 g/dL — AB (ref 3.5–5.2)
ALK PHOS: 98 U/L (ref 39–117)
ALT: 11 U/L (ref 0–35)
ALT: 11 U/L (ref 0–35)
ANION GAP: 11 (ref 5–15)
AST: 18 U/L (ref 0–37)
AST: 18 U/L (ref 0–37)
Albumin: 3 g/dL — ABNORMAL LOW (ref 3.5–5.2)
Alkaline Phosphatase: 129 U/L — ABNORMAL HIGH (ref 39–117)
Anion gap: 12 (ref 5–15)
BILIRUBIN TOTAL: 0.3 mg/dL (ref 0.3–1.2)
BUN: 69 mg/dL — ABNORMAL HIGH (ref 6–23)
BUN: 61 mg/dL — ABNORMAL HIGH (ref 6–23)
CALCIUM: 8.8 mg/dL (ref 8.4–10.5)
CHLORIDE: 72 meq/L — AB (ref 96–112)
CO2: 40 mEq/L (ref 19–32)
CO2: 35 meq/L — AB (ref 19–32)
CREATININE: 2.56 mg/dL — AB (ref 0.50–1.10)
Calcium: 9.8 mg/dL (ref 8.4–10.5)
Chloride: 79 mEq/L — ABNORMAL LOW (ref 96–112)
Creatinine, Ser: 3.12 mg/dL — ABNORMAL HIGH (ref 0.50–1.10)
GFR calc Af Amer: 17 mL/min — ABNORMAL LOW (ref >90)
GFR calc non Af Amer: 15 mL/min — ABNORMAL LOW (ref >90)
GFR calc non Af Amer: 19 mL/min — ABNORMAL LOW (ref >90)
GFR, EST AFRICAN AMERICAN: 22 mL/min — AB (ref >90)
GLUCOSE: 96 mg/dL (ref 70–99)
Glucose, Bld: 110 mg/dL — ABNORMAL HIGH (ref 70–99)
POTASSIUM: 3.4 meq/L — AB (ref 3.7–5.3)
Potassium: 3.3 mEq/L — ABNORMAL LOW (ref 3.7–5.3)
SODIUM: 123 meq/L — AB (ref 137–147)
Sodium: 126 mEq/L — ABNORMAL LOW (ref 137–147)
TOTAL PROTEIN: 8.8 g/dL — AB (ref 6.0–8.3)
Total Bilirubin: 0.3 mg/dL (ref 0.3–1.2)
Total Protein: 7.7 g/dL (ref 6.0–8.3)

## 2014-07-11 LAB — URINALYSIS, ROUTINE W REFLEX MICROSCOPIC
BILIRUBIN URINE: NEGATIVE
Glucose, UA: NEGATIVE mg/dL
Ketones, ur: NEGATIVE mg/dL
Leukocytes, UA: NEGATIVE
Nitrite: NEGATIVE
PH: 7 (ref 5.0–8.0)
Protein, ur: 30 mg/dL — AB
Specific Gravity, Urine: 1.012 (ref 1.005–1.030)
Urobilinogen, UA: 0.2 mg/dL (ref 0.0–1.0)

## 2014-07-11 LAB — CBC
HCT: 31.9 % — ABNORMAL LOW (ref 36.0–46.0)
Hemoglobin: 10.7 g/dL — ABNORMAL LOW (ref 12.0–15.0)
MCH: 27.4 pg (ref 26.0–34.0)
MCHC: 33.5 g/dL (ref 30.0–36.0)
MCV: 81.6 fL (ref 78.0–100.0)
PLATELETS: 322 10*3/uL (ref 150–400)
RBC: 3.91 MIL/uL (ref 3.87–5.11)
RDW: 14.9 % (ref 11.5–15.5)
WBC: 8.4 10*3/uL (ref 4.0–10.5)

## 2014-07-11 LAB — URINE MICROSCOPIC-ADD ON

## 2014-07-11 LAB — I-STAT CG4 LACTIC ACID, ED: Lactic Acid, Venous: 1.84 mmol/L (ref 0.5–2.2)

## 2014-07-11 LAB — PRO B NATRIURETIC PEPTIDE: Pro B Natriuretic peptide (BNP): 128.8 pg/mL — ABNORMAL HIGH (ref 0–125)

## 2014-07-11 MED ORDER — VANCOMYCIN HCL IN DEXTROSE 750-5 MG/150ML-% IV SOLN
750.0000 mg | INTRAVENOUS | Status: DC
  Administered 2014-07-12: 750 mg via INTRAVENOUS
  Filled 2014-07-11: qty 150

## 2014-07-11 MED ORDER — CETYLPYRIDINIUM CHLORIDE 0.05 % MT LIQD
7.0000 mL | Freq: Two times a day (BID) | OROMUCOSAL | Status: DC
  Administered 2014-07-12 – 2014-07-14 (×5): 7 mL via OROMUCOSAL

## 2014-07-11 MED ORDER — PIPERACILLIN-TAZOBACTAM IN DEX 2-0.25 GM/50ML IV SOLN
2.2500 g | Freq: Three times a day (TID) | INTRAVENOUS | Status: DC
  Administered 2014-07-11 – 2014-07-13 (×5): 2.25 g via INTRAVENOUS
  Filled 2014-07-11 (×7): qty 50

## 2014-07-11 MED ORDER — ENOXAPARIN SODIUM 100 MG/ML ~~LOC~~ SOLN
100.0000 mg | SUBCUTANEOUS | Status: DC
  Filled 2014-07-11: qty 1

## 2014-07-11 MED ORDER — ENOXAPARIN SODIUM 60 MG/0.6ML ~~LOC~~ SOLN
60.0000 mg | SUBCUTANEOUS | Status: DC
  Administered 2014-07-11: 60 mg via SUBCUTANEOUS
  Filled 2014-07-11 (×2): qty 0.6

## 2014-07-11 MED ORDER — HYDROMORPHONE HCL PF 1 MG/ML IJ SOLN: 1.0000 mg | INTRAMUSCULAR | Status: DC | PRN

## 2014-07-11 MED ORDER — ONDANSETRON HCL 4 MG PO TABS: 4.0000 mg | ORAL_TABLET | Freq: Four times a day (QID) | ORAL | Status: DC | PRN

## 2014-07-11 MED ORDER — ONDANSETRON HCL 4 MG/2ML IJ SOLN
4.0000 mg | Freq: Four times a day (QID) | INTRAMUSCULAR | Status: DC | PRN
  Administered 2014-07-11: 4 mg via INTRAVENOUS
  Filled 2014-07-11: qty 2

## 2014-07-11 MED ORDER — VANCOMYCIN HCL 10 G IV SOLR
1250.0000 mg | Freq: Once | INTRAVENOUS | Status: AC
  Administered 2014-07-12: 1250 mg via INTRAVENOUS
  Filled 2014-07-11: qty 1250

## 2014-07-11 MED ORDER — SODIUM CHLORIDE 0.9 % IV BOLUS (SEPSIS)
1000.0000 mL | Freq: Once | INTRAVENOUS | Status: AC
  Administered 2014-07-11: 1000 mL via INTRAVENOUS

## 2014-07-11 MED ORDER — CYCLOSPORINE 0.05 % OP EMUL
1.0000 [drp] | Freq: Two times a day (BID) | OPHTHALMIC | Status: DC
  Administered 2014-07-12: 1 [drp] via OPHTHALMIC
  Filled 2014-07-11 (×6): qty 1

## 2014-07-11 MED ORDER — CHLORHEXIDINE GLUCONATE 0.12 % MT SOLN
15.0000 mL | Freq: Two times a day (BID) | OROMUCOSAL | Status: DC
  Administered 2014-07-11 – 2014-07-13 (×4): 15 mL via OROMUCOSAL
  Filled 2014-07-11 (×8): qty 15

## 2014-07-11 MED ORDER — SODIUM CHLORIDE 0.9 % IV SOLN
INTRAVENOUS | Status: DC
  Administered 2014-07-11 – 2014-07-12 (×2): via INTRAVENOUS

## 2014-07-11 MED ORDER — MAGNESIUM HYDROXIDE 400 MG/5ML PO SUSP
30.0000 mL | Freq: Every day | ORAL | Status: DC | PRN
  Administered 2014-07-13: 30 mL via ORAL
  Filled 2014-07-11: qty 30

## 2014-07-11 MED ORDER — POTASSIUM CHLORIDE 10 MEQ/100ML IV SOLN
10.0000 meq | INTRAVENOUS | Status: AC
  Administered 2014-07-11 (×2): 10 meq via INTRAVENOUS
  Filled 2014-07-11 (×2): qty 100

## 2014-07-11 NOTE — Progress Notes (Signed)
ANTIBIOTIC CONSULT NOTE - INITIAL  Pharmacy Consult for Vancomycin & Zosyn Indication: pneumonia  Allergies  Allergen Reactions  . Ointment Base [Lanolin-Petrolatum] Rash    Specifically burn ointments.   Patient Measurements: Height: 5\' 5"  (165.1 cm) Weight: 141 lb 2 oz (64.014 kg) IBW/kg (Calculated) : 57  Vital Signs: Temp: 98.1 F (36.7 C) (08/12 2122) Temp src: Oral (08/12 2122) BP: 120/60 mmHg (08/12 2122) Pulse Rate: 83 (08/12 2122) Intake/Output from previous day:   Intake/Output from this shift:    Labs:  Recent Labs  07/11/14 1717  WBC 9.5  HGB 11.8*  PLT 355  CREATININE 3.12*   Estimated Creatinine Clearance: 16.8 ml/min (by C-G formula based on Cr of 3.12). No results found for this basename: VANCOTROUGH, VANCOPEAK, VANCORANDOM, GENTTROUGH, GENTPEAK, GENTRANDOM, TOBRATROUGH, TOBRAPEAK, TOBRARND, AMIKACINPEAK, AMIKACINTROU, AMIKACIN,  in the last 72 hours   Microbiology: No results found for this or any previous visit (from the past 720 hour(s)).  Medical History: Past Medical History  Diagnosis Date  . Medical history non-contributory   . HDL deficiency   . Allergy     seasonal allergic rhinitis  . History of radiation therapy 10/16/13-11/27/13    rectal 50.4Gy  . Cancer 09/19/13    rectum  . Peripheral vascular disease 4/15    DVT LEFT LEG   Medications:  Scheduled:  . [START ON 07/12/2014] antiseptic oral rinse  7 mL Mouth Rinse q12n4p  . chlorhexidine  15 mL Mouth Rinse BID  . [START ON 07/12/2014] cycloSPORINE  1 drop Both Eyes BID  . enoxaparin (LOVENOX) injection  60 mg Subcutaneous Q24H   Anti-infectives   Start     Dose/Rate Route Frequency Ordered Stop   07/11/14 2300  vancomycin (VANCOCIN) 1,250 mg in sodium chloride 0.9 % 250 mL IVPB     1,250 mg 166.7 mL/hr over 90 Minutes Intravenous  Once 07/11/14 2124     07/11/14 2200  piperacillin-tazobactam (ZOSYN) IVPB 2.25 g     2.25 g 100 mL/hr over 30 Minutes Intravenous 3 times per  day 07/11/14 2123       Assessment: 30 yoF with several day hx of nausea/constipation, fever, chills, stated 20 lb weight loss. SBO at anastomosis, gallstones, and RLL infiltrate on CT. Hx of rectal Ca, completed Xeloda 6/15.  Vancomycin and Zosyn per pharmacy for rule out PNA  Zosyn would treat abdominal infection if cholecystitis  Renal function very poor, Clearance ~16- 20 ml/min  Goal of Therapy:  Vancomycin trough level 15-20 mcg/ml  Plan:   Vancomycin 1250mg  x1, then 750mg  q24h  Follow renal function closely; anticipate improvement in clearance with hydration  Zosyn 2.25gm q8h  Minda Ditto PharmD Pager [phone] 07/11/2014, 9:35 PM

## 2014-07-11 NOTE — ED Notes (Signed)
Per pt, states was sent here by PCP-states possible blockage and possible kidney issue-has colon reversal last week

## 2014-07-11 NOTE — ED Notes (Signed)
Bed: [facility] Expected date:  Expected time:  Means of arrival:  Comments: Took meds twice, old person

## 2014-07-11 NOTE — ED Provider Notes (Signed)
CSN: [id number]     Arrival date & time 07/11/14  85 History   First MD Initiated Contact with Patient 07/11/14 1623     Chief Complaint  Patient presents with  . kidney problems      (Consider location/radiation/quality/duration/timing/severity/associated sxs/prior Treatment) Patient is a 63 y.o. female presenting with general illness. The history is provided by the patient. No language interpreter was used.  Illness Location:  Generalized Quality:  States she has felt unwell since ileostomy reveral 1 month ago w/ frequent n/v, 20lb wt loss, constipation Severity:  Severe Onset quality:  Gradual Duration:  1 month Timing:  Constant Progression:  Worsening Chronicity:  New Context:  Known rectal cancer Associated symptoms: fatigue, nausea and vomiting   Associated symptoms: no abdominal pain, no chest pain, no congestion, no cough, no diarrhea, no fever, no headaches, no loss of consciousness, no myalgias, no rhinorrhea, no shortness of breath and no sore throat   Vomiting:    Quality:  Bilious material   Number of occurrences:  Near daily   Severity:  Moderate   Duration:  1 month   Timing:  Intermittent   Progression:  Unchanged   Past Medical History  Diagnosis Date  . Medical history non-contributory   . HDL deficiency   . Allergy     seasonal allergic rhinitis  . History of radiation therapy 10/16/13-11/27/13    rectal 50.4Gy  . Cancer 09/19/13    rectum  . Peripheral vascular disease 4/15    DVT LEFT LEG   Past Surgical History  Procedure Laterality Date  . Left leg surgery  1986    rod from hip to knee  . Colonoscopy with propofol N/A 09/19/2013    Procedure: COLONOSCOPY WITH PROPOFOL;  Surgeon: Garlan Fair, MD;  Location: [facility];  Service: Endoscopy;  Laterality: N/A;  . Femur fracture surgery Left 1985    steel rod  . Eus N/A 09/27/2013    Procedure: LOWER ENDOSCOPIC ULTRASOUND (EUS);  Surgeon: Arta Silence, MD;  Location: [facility];   Service: Endoscopy;  Laterality: N/A;  . Laparoscopic low anterior resection N/A 01/12/2014    Procedure: LAPAROSCOPIC LOW ANTERIOR RESECTION ;  Surgeon: Leighton Ruff, MD;  Location: [facility];  Service: General;  Laterality: N/A;  . Ileo loop diversion N/A 01/12/2014    Procedure:   LOOP ILEOSTOMY;  Surgeon: Leighton Ruff, MD;  Location: [facility];  Service: General;  Laterality: N/A;  . Ileostomy closure N/A 06/07/2014    Procedure: ILEOSTOMY reversal ;  Surgeon: Leighton Ruff, MD;  Location: [facility];  Service: General;  Laterality: N/A;   Family History  Problem Relation Age of Onset  . Hypertension Mother   . Diabetes Father   . Heart disease Father     CAD   History  Substance Use Topics  . Smoking status: Never Smoker   . Smokeless tobacco: Never Used  . Alcohol Use: Yes     Comment: occasional wine   OB History   Grav Para Term Preterm Abortions TAB SAB Ect Mult Living                 Review of Systems  Constitutional: Positive for fatigue. Negative for fever, chills, diaphoresis, activity change and appetite change.  HENT: Negative for congestion, facial swelling, rhinorrhea and sore throat.   Eyes: Negative for photophobia and discharge.  Respiratory: Negative for cough, chest tightness and shortness of breath.   Cardiovascular: Negative for chest pain, palpitations and leg swelling.  Gastrointestinal: Positive for nausea and vomiting. Negative for abdominal pain and diarrhea.  Endocrine: Negative for polydipsia and polyuria.  Genitourinary: Negative for dysuria, frequency, difficulty urinating and pelvic pain.  Musculoskeletal: Negative for arthralgias, back pain, myalgias, neck pain and neck stiffness.  Skin: Negative for color change and wound.  Allergic/Immunologic: Negative for immunocompromised state.  Neurological: Negative for loss of consciousness, facial asymmetry, weakness, numbness and headaches.  Hematological: Does not bruise/bleed easily.    Psychiatric/Behavioral: Negative for confusion and agitation.      Allergies  Ointment base  Home Medications   Prior to Admission medications   Medication Sig Start Date End Date Taking? Authorizing Provider  cycloSPORINE (RESTASIS) 0.05 % ophthalmic emulsion Place 1 drop into both eyes 2 (two) times daily.   Yes Historical Provider, MD  enoxaparin (LOVENOX) 100 MG/ML injection Inject 100 mg into the skin daily.   Yes Historical Provider, MD  loperamide (IMODIUM) 2 MG capsule Take 2 mg by mouth 3 (three) times daily as needed for diarrhea or loose stools.   Yes Historical Provider, MD  magnesium hydroxide (MILK OF MAGNESIA) 400 MG/5ML suspension Take 30 mLs by mouth daily as needed for mild constipation.   Yes Historical Provider, MD  oxyCODONE-acetaminophen (PERCOCET/ROXICET) 5-325 MG per tablet Take 1 tablet by mouth every 4 (four) hours as needed for moderate pain. 03/11/86  Yes Leighton Ruff, MD  psyllium (METAMUCIL) 58.6 % powder Take 1 packet by mouth daily as needed. constipation   Yes Historical Provider, MD   BP 127/58  Pulse 85  Temp(Src) 99.5 F (37.5 C) (Rectal)  Resp 18  SpO2 97% Physical Exam  Constitutional: She is oriented to person, place, and time. She appears well-developed and well-nourished. No distress.  HENT:  Head: Normocephalic and atraumatic.  Mouth/Throat: No oropharyngeal exudate.  Eyes: Pupils are equal, round, and reactive to light.  Neck: Normal range of motion. Neck supple.  Cardiovascular: Normal rate, regular rhythm and normal heart sounds.  Exam reveals no gallop and no friction rub.   No murmur heard. Pulmonary/Chest: Effort normal and breath sounds normal. No respiratory distress. She has no wheezes. She has no rales.  Abdominal: Soft. She exhibits no distension and no mass. Bowel sounds are increased. There is no tenderness. There is no rebound and no guarding.    Musculoskeletal: Normal range of motion. She exhibits no edema and no  tenderness.  Neurological: She is alert and oriented to person, place, and time.  Skin: Skin is warm and dry.  Psychiatric: She has a normal mood and affect.    ED Course  Procedures (including critical care time) Labs Review Labs Reviewed  CBC WITH DIFFERENTIAL - Abnormal; Notable for the following:    Hemoglobin 11.8 (*)    HCT 34.5 (*)    Lymphocytes Relative 8 (*)    Monocytes Relative 17 (*)    Monocytes Absolute 1.6 (*)    All other components within normal limits  COMPREHENSIVE METABOLIC PANEL - Abnormal; Notable for the following:    Sodium 123 (*)    Potassium 3.4 (*)    Chloride 72 (*)    CO2 40 (*)    Glucose, Bld 110 (*)    BUN 69 (*)    Creatinine, Ser 3.12 (*)    Total Protein 8.8 (*)    Albumin 3.4 (*)    GFR calc non Af Amer 15 (*)    GFR calc Af Amer 17 (*)    All other components within normal limits  URINALYSIS, ROUTINE W REFLEX MICROSCOPIC - Abnormal; Notable for the following:    APPearance CLOUDY (*)    Hgb urine dipstick SMALL (*)    Protein, ur 30 (*)    All other components within normal limits  URINE MICROSCOPIC-ADD ON - Abnormal; Notable for the following:    Bacteria, UA FEW (*)    All other components within normal limits  URINE CULTURE  I-STAT CG4 LACTIC ACID, ED    Imaging Review Ct Abdomen Pelvis Wo Contrast  07/11/2014   CLINICAL DATA:  Weight loss. Vomiting. One month status post ileostomy reversal.  EXAM: CT ABDOMEN AND PELVIS WITHOUT CONTRAST  TECHNIQUE: Multidetector CT imaging of the abdomen and pelvis was performed following the standard protocol without IV contrast.  COMPARISON:  12/22/2013  FINDINGS: There are small bowel dilation extending from the duodenum, which measures 5.1 cm at the junction of the second and third portions, through the jejunum, where the maximum diameter is 4.9 cm. The more distal small bowel is decompressed, which begins in the general location of the small bowel anastomosis staple line in the right mid  abdomen. The colon is partly decompressed. Diverticula are noted along the sigmoid colon. No diverticulitis. No bowel wall thickening or adjacent inflammation. There is no free air. Stranding in the fat of the right mid abdomen is consistent with the ileostomy site. There is no hernia.  There is patchy airspace opacity in the right lower and middle lobes. Linear opacity is noted in the left lower lobe and base of the lingula of the left upper lobe, likely atelectasis. Heart is normal in size. No pleural effusions.  Vague low attenuation area measuring 11 mm in the right lobe of the liver not seen on the prior study. This is not well defined on this unenhanced study. Liver is otherwise unremarkable.  Gallbladder is abnormal. There is evidence of stones and likely sludge. Wall is poorly defined. Consider acute cholecystitis if this correlates clinically. The common bile duct is dilated to 7 mm. No duct stone is seen. There is dilation of the pancreatic duct centrally to just over 6 mm. Both the common bile duct and pancreatic duct are stable in size from the prior study.  Spleen is unremarkable. No pancreatic masses or inflammation. No adrenal masses. Kidneys, ureters and bladder are unremarkable. Uterus and adnexa are unremarkable.  There is stranding throughout the presacral fat. Anastomosis staples lie along the inferior aspect of the rectum near the anorectal junction.  No pathologically enlarged lymph nodes.  No ascites.  No free air.  IMPRESSION: 1. Partial small bowel obstruction. Transition point appears to be at the level of the small bowel anastomosis in the right mid abdomen. It may be from the anastomosis or from adjacent postsurgical scarring/adhesions or internal hernia. 2. Multiple gallstones. Gallbladder wall is poorly defined. Consider acute cholecystitis if there are consistent clinical symptoms. 3. Patchy airspace opacity at the base of right lower lobe right middle lobe. This is suspicious for  pneumonia. 4. No other acute findings.   Electronically Signed   By: Lajean Manes M.D.   On: 07/11/2014 19:34   Dg Abd 1 View  07/11/2014   CLINICAL DATA:  Abdominal pain  EXAM: ABDOMEN - 1 VIEW  COMPARISON:  12/22/2013  FINDINGS: Postsurgical changes are noted in the proximal left femur. Mild degenerative change of the lumbar spine is seen. Scattered large and small bowel gas is noted. A few loops of mildly dilated small bowel are identified in the  left mid abdomen. This may represent a an ileus or partial small bowel obstruction. Postsurgical changes are noted on the right.  IMPRESSION: Changes suggestive of mild ileus or partial small bowel obstruction. Correlation with physical exam is recommended.   Electronically Signed   By: Inez Catalina M.D.   On: 07/11/2014 12:51     EKG Interpretation None      MDM   Final diagnoses:  Partial small bowel obstruction  Hypokalemia  Hypochloremia  Acute renal failure, unspecified acute renal failure type  Hypercarbia  Dehydration    Pt is a 63 y.o. female with Pmhx as above who presents with XR from PCP office w/ ileus vs PSBO and reports of "kidney issue". She has been feeling unwell, has had 20 lb wt loss, frequent n/v since ileostomy reversal about 1 month ago. She denies abdominal pain, fever, urinary symptoms. She has recently had to take metamucil or MOM to have a BM. On PE, T 99, VS otherwise stable. Ostomy site well-healing. Bowel sounds hyperactive. No ttp. Will get CT, CBC, CMP, UA. She is comfortable, and does not want pain meds. IVF to be given.   CT w/ PSBO likely w/ transition at anastomosis. She also has multiple e-lyte abnormalities and ARF.  Dr. Marcello Moores w/ [facility] will see in ED, [facility] will admit.       Ernestina Patches, MD 07/11/14 2045

## 2014-07-11 NOTE — Progress Notes (Signed)
ANTICOAGULATION CONSULT NOTE - Initial Consult  Pharmacy Consult for Lovenox Indication: chronic Lovenox for DVT  Allergies  Allergen Reactions  . Ointment Base [Lanolin-Petrolatum] Rash    Specifically burn ointments.   Patient Measurements: Height: 5\' 5"  (165.1 cm) Weight: 141 lb 2 oz (64.014 kg) IBW/kg (Calculated) : 57  Vital Signs: Temp: 98.1 F (36.7 C) (08/12 2122) Temp src: Oral (08/12 2122) BP: 120/60 mmHg (08/12 2122) Pulse Rate: 83 (08/12 2122)  Labs:  Recent Labs  07/11/14 1717  HGB 11.8*  HCT 34.5*  PLT 355  CREATININE 3.12*   Estimated Creatinine Clearance: 16.8 ml/min (by C-G formula based on Cr of 3.12).  Medical History: Past Medical History  Diagnosis Date  . Medical history non-contributory   . HDL deficiency   . Allergy     seasonal allergic rhinitis  . History of radiation therapy 10/16/13-11/27/13    rectal 50.4Gy  . Cancer 09/19/13    rectum  . Peripheral vascular disease 4/15    DVT LEFT LEG   Medications:  Scheduled:  . [START ON 07/12/2014] antiseptic oral rinse  7 mL Mouth Rinse q12n4p  . chlorhexidine  15 mL Mouth Rinse BID  . [START ON 07/12/2014] cycloSPORINE  1 drop Both Eyes BID  . enoxaparin (LOVENOX) injection  60 mg Subcutaneous Q24H  . [START ON 07/12/2014] vancomycin  750 mg Intravenous Q24H   Assessment: 68 yoF with several day hx of nausea/constipation, fever, chills, stated 20 lb weight loss. SBO at anastomosis, gallstones, and RLL infiltrate on CT. Hx of rectal Ca, completed Xeloda 6/15.  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

## 2014-07-11 NOTE — Consult Note (Signed)
Reason for Consult: Partial SBO Referring Physician: Dr Carlyon Victoria [name] is an 63 y.o. female.  HPI: She is well known to me.  I resected her rectal cancer several months ago.  She received adjuvant chemotherapy and then developed a PE.  She was placed on therapeutic lovenox.  I reversed her loop ileostomy ~6 weeks after that.  She states that she has had irregular bowel movements since her ileostomy reversal 6 weeks ago.  She has had loss of appetite and weight loss as well.  Over the last 3 -4 days, she has developed nausea and vomiting.  Prior to that she was having symptoms of constipation and had been taking MoM.  She reports lethargy as well.    Past Medical History  Diagnosis Date  . Medical history non-contributory   . HDL deficiency   . Allergy     seasonal allergic rhinitis  . History of radiation therapy 10/16/13-11/27/13    rectal 50.4Gy  . Cancer 09/19/13    rectum  . Peripheral vascular disease 4/15    DVT LEFT LEG    Past Surgical History  Procedure Laterality Date  . Left leg surgery  1986    rod from hip to knee  . Colonoscopy with propofol N/A 09/19/2013    Procedure: COLONOSCOPY WITH PROPOFOL;  Surgeon: Garlan Fair, MD;  Location: [facility];  Service: Endoscopy;  Laterality: N/A;  . Femur fracture surgery Left 1985    steel rod  . Eus N/A 09/27/2013    Procedure: LOWER ENDOSCOPIC ULTRASOUND (EUS);  Surgeon: Arta Silence, MD;  Location: [facility];  Service: Endoscopy;  Laterality: N/A;  . Laparoscopic low anterior resection N/A 01/12/2014    Procedure: LAPAROSCOPIC LOW ANTERIOR RESECTION ;  Surgeon: Victoria Ruff, MD;  Location: [facility];  Service: General;  Laterality: N/A;  . Ileo loop diversion N/A 01/12/2014    Procedure:   LOOP ILEOSTOMY;  Surgeon: Victoria Ruff, MD;  Location: [facility];  Service: General;  Laterality: N/A;  . Ileostomy closure N/A 06/07/2014    Procedure: ILEOSTOMY reversal ;  Surgeon: Victoria Ruff, MD;  Location: [facility];   Service: General;  Laterality: N/A;    Family History  Problem Relation Age of Onset  . Hypertension Mother   . Diabetes Father   . Heart disease Father     CAD    Social History:  reports that she has never smoked. She has never used smokeless tobacco. She reports that she drinks alcohol. She reports that she does not use illicit drugs.  Allergies:  Allergies  Allergen Reactions  . Ointment Base [Lanolin-Petrolatum] Rash    Specifically burn ointments.    Medications: I have reviewed the patient's current medications.  Results for orders placed during the hospital encounter of 07/11/14 (from the past 48 hour(s))  CBC WITH DIFFERENTIAL     Status: Abnormal   Collection Time    07/11/14  5:17 PM      Result Value Ref Range   WBC 9.5  4.0 - 10.5 K/uL   RBC 4.22  3.87 - 5.11 MIL/uL   Hemoglobin 11.8 (*) 12.0 - 15.0 g/dL   HCT 34.5 (*) 36.0 - 46.0 %   MCV 81.8  78.0 - 100.0 fL   MCH 28.0  26.0 - 34.0 pg   MCHC 34.2  30.0 - 36.0 g/dL   RDW 14.7  11.5 - 15.5 %   Platelets 355  150 - 400 K/uL   Neutrophils Relative % 75  43 - 77 %   Lymphocytes Relative 8 (*) 12 - 46 %   Monocytes Relative 17 (*) 3 - 12 %   Eosinophils Relative 0  0 - 5 %   Basophils Relative 0  0 - 1 %   Neutro Abs 7.1  1.7 - 7.7 K/uL   Lymphs Abs 0.8  0.7 - 4.0 K/uL   Monocytes Absolute 1.6 (*) 0.1 - 1.0 K/uL   Eosinophils Absolute 0.0  0.0 - 0.7 K/uL   Basophils Absolute 0.0  0.0 - 0.1 K/uL   RBC Morphology POLYCHROMASIA PRESENT     Comment: TARGET CELLS   WBC Morphology TOXIC GRANULATION     Comment: VACUOLATED NEUTROPHILS     MILD LEFT SHIFT (1-5% METAS, OCC MYELO, OCC BANDS)     DOHLE BODIES  COMPREHENSIVE METABOLIC PANEL     Status: Abnormal   Collection Time    07/11/14  5:17 PM      Result Value Ref Range   Sodium 123 (*) 137 - 147 mEq/L   Potassium 3.4 (*) 3.7 - 5.3 mEq/L   Chloride 72 (*) 96 - 112 mEq/L   CO2 40 (*) 19 - 32 mEq/L   Comment: CRITICAL RESULT CALLED TO, READ BACK BY AND  VERIFIED WITH:     Victoria Mew RN 1815 07/11/14 Victoria [name]   Glucose, Bld 110 (*) 70 - 99 mg/dL   BUN 69 (*) 6 - 23 mg/dL   Creatinine, Ser 3.12 (*) 0.50 - 1.10 mg/dL   Calcium 9.8  8.4 - 10.5 mg/dL   Total Protein 8.8 (*) 6.0 - 8.3 g/dL   Albumin 3.4 (*) 3.5 - 5.2 g/dL   AST 18  0 - 37 U/L   ALT 11  0 - 35 U/L   Alkaline Phosphatase 98  39 - 117 U/L   Total Bilirubin 0.3  0.3 - 1.2 mg/dL   GFR calc non Af Amer 15 (*) >90 mL/min   GFR calc Af Amer 17 (*) >90 mL/min   Comment: (NOTE)     The eGFR has been calculated using the CKD EPI equation.     This calculation has not been validated in all clinical situations.     eGFR's persistently <90 mL/min signify possible Chronic Kidney     Disease.   Anion gap 11  5 - 15  I-STAT CG4 LACTIC ACID, ED     Status: None   Collection Time    07/11/14  5:31 PM      Result Value Ref Range   Lactic Acid, Venous 1.84  0.5 - 2.2 mmol/L  URINALYSIS, ROUTINE W REFLEX MICROSCOPIC     Status: Abnormal   Collection Time    07/11/14  5:39 PM      Result Value Ref Range   Color, Urine YELLOW  YELLOW   APPearance CLOUDY (*) CLEAR   Specific Gravity, Urine 1.012  1.005 - 1.030   pH 7.0  5.0 - 8.0   Glucose, UA NEGATIVE  NEGATIVE mg/dL   Hgb urine dipstick SMALL (*) NEGATIVE   Bilirubin Urine NEGATIVE  NEGATIVE   Ketones, ur NEGATIVE  NEGATIVE mg/dL   Protein, ur 30 (*) NEGATIVE mg/dL   Urobilinogen, UA 0.2  0.0 - 1.0 mg/dL   Nitrite NEGATIVE  NEGATIVE   Leukocytes, UA NEGATIVE  NEGATIVE  URINE MICROSCOPIC-ADD ON     Status: Abnormal   Collection Time    07/11/14  5:39 PM      Result Value  Ref Range   Squamous Epithelial / LPF RARE  RARE   WBC, UA 0-2  <3 WBC/hpf   RBC / HPF 3-6  <3 RBC/hpf   Bacteria, UA FEW (*) RARE    Ct Abdomen Pelvis Wo Contrast  07/11/2014   CLINICAL DATA:  Weight loss. Vomiting. One month status post ileostomy reversal.  EXAM: CT ABDOMEN AND PELVIS WITHOUT CONTRAST  TECHNIQUE: Multidetector CT imaging of the abdomen and  pelvis was performed following the standard protocol without IV contrast.  COMPARISON:  12/22/2013  FINDINGS: There are small bowel dilation extending from the duodenum, which measures 5.1 cm at the junction of the second and third portions, through the jejunum, where the maximum diameter is 4.9 cm. The more distal small bowel is decompressed, which begins in the general location of the small bowel anastomosis staple line in the right mid abdomen. The colon is partly decompressed. Diverticula are noted along the sigmoid colon. No diverticulitis. No bowel wall thickening or adjacent inflammation. There is no free air. Stranding in the fat of the right mid abdomen is consistent with the ileostomy site. There is no hernia.  There is patchy airspace opacity in the right lower and middle lobes. Linear opacity is noted in the left lower lobe and base of the lingula of the left upper lobe, likely atelectasis. Heart is normal in size. No pleural effusions.  Vague low attenuation area measuring 11 mm in the right lobe of the liver not seen on the prior study. This is not well defined on this unenhanced study. Liver is otherwise unremarkable.  Gallbladder is abnormal. There is evidence of stones and likely sludge. Wall is poorly defined. Consider acute cholecystitis if this correlates clinically. The common bile duct is dilated to 7 mm. No duct stone is seen. There is dilation of the pancreatic duct centrally to just over 6 mm. Both the common bile duct and pancreatic duct are stable in size from the prior study.  Spleen is unremarkable. No pancreatic masses or inflammation. No adrenal masses. Kidneys, ureters and bladder are unremarkable. Uterus and adnexa are unremarkable.  There is stranding throughout the presacral fat. Anastomosis staples lie along the inferior aspect of the rectum near the anorectal junction.  No pathologically enlarged lymph nodes.  No ascites.  No free air.  IMPRESSION: 1. Partial small bowel  obstruction. Transition point appears to be at the level of the small bowel anastomosis in the right mid abdomen. It may be from the anastomosis or from adjacent postsurgical scarring/adhesions or internal hernia. 2. Multiple gallstones. Gallbladder wall is poorly defined. Consider acute cholecystitis if there are consistent clinical symptoms. 3. Patchy airspace opacity at the base of right lower lobe right middle lobe. This is suspicious for pneumonia. 4. No other acute findings.   Electronically Signed   By: Victoria [name] M.D.   On: 07/11/2014 19:34   Dg Abd 1 View  07/11/2014   CLINICAL DATA:  Abdominal pain  EXAM: ABDOMEN - 1 VIEW  COMPARISON:  12/22/2013  FINDINGS: Postsurgical changes are noted in the proximal left femur. Mild degenerative change of the lumbar spine is seen. Scattered large and small bowel gas is noted. A few loops of mildly dilated small bowel are identified in the left mid abdomen. This may represent a an ileus or partial small bowel obstruction. Postsurgical changes are noted on the right.  IMPRESSION: Changes suggestive of mild ileus or partial small bowel obstruction. Correlation with physical exam is recommended.   Electronically Signed  By: Victoria [name] M.D.   On: 07/11/2014 12:51    Review of Systems  Constitutional: Negative for fever and chills.  Eyes: Negative for blurred vision.  Respiratory: Negative for cough, sputum production and shortness of breath.   Cardiovascular: Negative for chest pain and leg swelling.  Gastrointestinal: Positive for nausea, vomiting and constipation. Negative for abdominal pain and diarrhea.  Genitourinary: Negative for dysuria, urgency and frequency.  Neurological: Negative for dizziness and headaches.   Blood pressure 120/60, pulse 83, temperature 98.1 F (36.7 C), temperature source Oral, resp. rate 18, height 5' 5"  (1.651 m), weight 141 lb 2 oz (64.014 kg), SpO2 95.00%. Physical Exam  Constitutional: She is oriented to person,  place, and time. She appears well-developed and well-nourished.  Mild distress  HENT:  Head: Normocephalic and atraumatic.  Eyes: Conjunctivae are normal. Pupils are equal, round, and reactive to light.  Neck: Normal range of motion.  Cardiovascular: Normal rate and regular rhythm.   Respiratory: Effort normal and breath sounds normal. No respiratory distress.  GI: Soft. She exhibits no distension. There is no tenderness. There is no rebound and no guarding.  Musculoskeletal: Normal range of motion.  Neurological: She is alert and oriented to person, place, and time.  Skin: Skin is warm and dry.    Assessment/Plan: ARF with hyponatremia and metabolic alkalosis Partial SBO on CT H/o PE on therapeutic Lovenox  Appreciate hospitalist input.  I do not see a need for NG at this time.  Would recommend NPO and bowel rest for obstruction.  Replace fluids and correct electrolytes.  No surgical intervention indicated at this time.  Will follow with you.  Will notify her oncologist Dr Victoria [name] that she is admitted.      Lailanie Hasley C. 3/00/9233, 0:07 PM

## 2014-07-11 NOTE — H&P (Addendum)
[facility] History and Physical  Victoria [name] IWL:[id number] DOB: 17-May-1951 DOA: 07/11/2014  Referring physician: ED physician PCP: Irven Shelling, MD   Chief Complaint: weakness   HPI:  Pt is 63 yo female with rectal cancer and status post Xeloda, completed in June 2015, recent ileostomy reversal 05/16/2014, DVT (currently on Lovenox), presented to [facility] ED with main concern of several days duration of progressively worsening malaise, nausea, poor oral intake, constipation, weight loss of 20 lbs. She reports associated subjective fevers, chills, occasional episodes of cough productive of clear sputum, intermittent episodes of epigastric discomfort but no abd pain at this time. She denies chest pain and shortness of breath.   In ED, pt noted to be hemodynamically stable with low grade fever 99.5 F, significant electrolyte abnormalities on BMP, including Na 123, K 3.4, CO2 = 40, Cr 3.12. CT abd and pelvis worrisome for partial SBP, ? Underlying acute cholecystitis, ? PNA. [facility] asked to admit to telemetry bed and surgery consulted by ED doctor. Dr. Marcello Moores called to consult.   Assessment and Plan: Active Problems: Partial SBO vs ileus  - admit to telemetry bed - provide supportive care with IVF, analgesia, antiemetics as needed - keep NPO and follow up on surgery recommendations ? PNA - will treat empirically for now given low grade fevers, productive cough, suspicious finding on CXR - sputum analysis requested, urine legionella and strep penumo - sputum and blood cultures ordered  - ABX Vancomycin and Zosyn for now and narrow down as clinically indicated  Gallstones, ? Cholecystitis  - not much tender in RUQ are with stable LFT's - empiric ABX started for presumptive PNA and should be adequate for cholecystitis in any case - will follow up on surgery opinion  Rectal cancer  - completed Xeloda in June 2015  DVT - continue Lovenox per pharmacy  Hyponatremia - likely pre  renal and secondary to poor oral intake  - IVF provided and will repeat BMP in AM Hypokalemia - supplement via IV route and repeat BMP in AM Acute renal failure  - also likely pre renal and from poor oral intake - IVF will be provided and will repeat BMP in AM DVT prophylaxis - pt on full dose Lovenox for DVT  Severe malnutrition - with underlying weight loss of 20 lbs - NPO for now but place on nutritional supplements once more medically stable and able to take PO   Radiological Exams on Admission: Ct Abdomen Pelvis Wo Contrast  07/11/2014    1. Partial SBO. Transition point appears to be at the level of the small bowel anastomosis in the right mid abdomen.  2. Multiple gallstones.Consider acute cholecystitis if there are consistent clinical symptoms.  3. Patchy airspace opacity at the base of right lower lobe right middle lobe. This is suspicious for pneumonia.  4. No other acute findings.    Dg Abd 1 View  07/11/2014   1. Changes suggestive of mild ileus or partial small bowel obstruction.   Code Status: Full Family Communication: Pt at bedside Disposition Plan: Admit for further evaluation    Review of Systems:  Constitutional: Negative for diaphoresis.  HENT: Negative for hearing loss, ear pain, nosebleeds,  neck pain, tinnitus and ear discharge.   Eyes: Negative for blurred vision, double vision, photophobia, pain, discharge and redness.  Respiratory: Negative for cough, hemoptysis, sputum production, shortness of breath, wheezing and stridor.   Cardiovascular: Negative for chest pain, palpitations, orthopnea, claudication and leg swelling.  Gastrointestinal: Per HPI Genitourinary:  Negative for dysuria, urgency, frequency, hematuria and flank pain.  Musculoskeletal: Negative for myalgias, and falls.  Skin: Negative for itching and rash.  Neurological: Negative for dizziness and weakness.   Endo/Heme/Allergies: Negative for environmental allergies and polydipsia. Does not  bruise/bleed easily.  Psychiatric/Behavioral: Negative for suicidal ideas. The patient is not nervous/anxious.      Past Medical History  Diagnosis Date  . Medical history non-contributory   . HDL deficiency   . Allergy     seasonal allergic rhinitis  . History of radiation therapy 10/16/13-11/27/13    rectal 50.4Gy  . Cancer 09/19/13    rectum  . Peripheral vascular disease 4/15    DVT LEFT LEG    Past Surgical History  Procedure Laterality Date  . Left leg surgery  1986    rod from hip to knee  . Colonoscopy with propofol N/A 09/19/2013    Procedure: COLONOSCOPY WITH PROPOFOL;  Surgeon: Garlan Fair, MD;  Location: [facility];  Service: Endoscopy;  Laterality: N/A;  . Femur fracture surgery Left 1985    steel rod  . Eus N/A 09/27/2013    Procedure: LOWER ENDOSCOPIC ULTRASOUND (EUS);  Surgeon: Arta Silence, MD;  Location: [facility];  Service: Endoscopy;  Laterality: N/A;  . Laparoscopic low anterior resection N/A 01/12/2014    Procedure: LAPAROSCOPIC LOW ANTERIOR RESECTION ;  Surgeon: Leighton Ruff, MD;  Location: [facility];  Service: General;  Laterality: N/A;  . Ileo loop diversion N/A 01/12/2014    Procedure:   LOOP ILEOSTOMY;  Surgeon: Leighton Ruff, MD;  Location: [facility];  Service: General;  Laterality: N/A;  . Ileostomy closure N/A 06/07/2014    Procedure: ILEOSTOMY reversal ;  Surgeon: Leighton Ruff, MD;  Location: [facility];  Service: General;  Laterality: N/A;    Social History:  reports that she has never smoked. She has never used smokeless tobacco. She reports that she drinks alcohol. She reports that she does not use illicit drugs.  Allergies  Allergen Reactions  . Ointment Base [Lanolin-Petrolatum] Rash    Specifically burn ointments.    Family History  Problem Relation Age of Onset  . Hypertension Mother   . Diabetes Father   . Heart disease Father     CAD    Prior to Admission medications   Medication Sig Start Date End Date Taking? Authorizing  Provider  cycloSPORINE (RESTASIS) 0.05 % ophthalmic emulsion Place 1 drop into both eyes 2 (two) times daily.   Yes Historical Provider, MD  enoxaparin (LOVENOX) 100 MG/ML injection Inject 100 mg into the skin daily.   Yes Historical Provider, MD  loperamide (IMODIUM) 2 MG capsule Take 2 mg by mouth 3 (three) times daily as needed for diarrhea or loose stools.   Yes Historical Provider, MD  magnesium hydroxide (MILK OF MAGNESIA) 400 MG/5ML suspension Take 30 mLs by mouth daily as needed for mild constipation.   Yes Historical Provider, MD  oxyCODONE-acetaminophen (PERCOCET/ROXICET) 5-325 MG per tablet Take 1 tablet by mouth every 4 (four) hours as needed for moderate pain. 7/82/95  Yes Leighton Ruff, MD  psyllium (METAMUCIL) 58.6 % powder Take 1 packet by mouth daily as needed. constipation   Yes Historical Provider, MD    Physical Exam: Filed Vitals:   07/11/14 1551 07/11/14 1612 07/11/14 1851  BP: 127/78 128/72 127/58  Pulse: 94 88 85  Temp: 99 F (37.2 C) 98.9 F (37.2 C) 99.5 F (37.5 C)  TempSrc: Oral Oral Rectal  Resp: 16 24 18   SpO2: 93%  93% 97%    Physical Exam  Constitutional: Appears well-developed and well-nourished. No distress.  HENT: Normocephalic. External right and left ear normal. Dry MM Eyes: Conjunctivae and EOM are normal. PERRLA, no scleral icterus.  Neck: Normal ROM. Neck supple. No JVD. No tracheal deviation. No thyromegaly.  CVS: RRR, S1/S2 +, no murmurs, no gallops, no carotid bruit.  Pulmonary: Effort and breath sounds normal, no stridor, mild rales at bases  Abdominal: Soft. BS +,  no distension, tenderness in epigastric area, no rebound or guarding.  Musculoskeletal: Normal range of motion. No edema and no tenderness.  Lymphadenopathy: No lymphadenopathy noted, cervical, inguinal. Neuro: Alert. Normal reflexes, muscle tone coordination. No cranial nerve deficit. Skin: Skin is warm and dry. No rash noted. Not diaphoretic. No erythema. No pallor.   Psychiatric: Normal mood and affect.   Labs on Admission:  Basic Metabolic Panel:  Recent Labs Lab 07/11/14 1717  NA 123*  K 3.4*  CL 72*  CO2 40*  GLUCOSE 110*  BUN 69*  CREATININE 3.12*  CALCIUM 9.8   Liver Function Tests:  Recent Labs Lab 07/11/14 1717  AST 18  ALT 11  ALKPHOS 98  BILITOT 0.3  PROT 8.8*  ALBUMIN 3.4*   CBC:  Recent Labs Lab 07/11/14 1717  WBC 9.5  NEUTROABS 7.1  HGB 11.8*  HCT 34.5*  MCV 81.8  PLT 355    EKG: Normal sinus rhythm, no ST/T wave changes  Faye Ramsay, MD  [facility] Pager [phone]  If 7PM-7AM, please contact night-coverage [email or web address] Password [facility] 07/11/2014, 8:38 PM

## 2014-07-12 DIAGNOSIS — R634 Abnormal weight loss: Secondary | ICD-10-CM | POA: Diagnosis present

## 2014-07-12 DIAGNOSIS — K56609 Unspecified intestinal obstruction, unspecified as to partial versus complete obstruction: Secondary | ICD-10-CM

## 2014-07-12 DIAGNOSIS — E876 Hypokalemia: Secondary | ICD-10-CM

## 2014-07-12 DIAGNOSIS — I82409 Acute embolism and thrombosis of unspecified deep veins of unspecified lower extremity: Secondary | ICD-10-CM

## 2014-07-12 DIAGNOSIS — R627 Adult failure to thrive: Secondary | ICD-10-CM

## 2014-07-12 DIAGNOSIS — E43 Unspecified severe protein-calorie malnutrition: Secondary | ICD-10-CM | POA: Insufficient documentation

## 2014-07-12 DIAGNOSIS — N179 Acute kidney failure, unspecified: Secondary | ICD-10-CM

## 2014-07-12 LAB — LEGIONELLA ANTIGEN, URINE: Legionella Antigen, Urine: NEGATIVE

## 2014-07-12 LAB — URINE CULTURE

## 2014-07-12 LAB — STREP PNEUMONIAE URINARY ANTIGEN: Strep Pneumo Urinary Antigen: NEGATIVE

## 2014-07-12 LAB — HIV ANTIBODY (ROUTINE TESTING W REFLEX): HIV 1&2 Ab, 4th Generation: NONREACTIVE

## 2014-07-12 LAB — TSH: TSH: 1.31 u[IU]/mL (ref 0.350–4.500)

## 2014-07-12 MED ORDER — BUDESONIDE 0.25 MG/2ML IN SUSP
0.2500 mg | Freq: Two times a day (BID) | RESPIRATORY_TRACT | Status: DC
  Administered 2014-07-12 – 2014-07-14 (×4): 0.25 mg via RESPIRATORY_TRACT
  Filled 2014-07-12 (×8): qty 2

## 2014-07-12 MED ORDER — BOOST / RESOURCE BREEZE PO LIQD
1.0000 | Freq: Three times a day (TID) | ORAL | Status: DC
  Administered 2014-07-12 – 2014-07-13 (×3): 1 via ORAL

## 2014-07-12 MED ORDER — PSYLLIUM 95 % PO PACK
1.0000 | PACK | Freq: Two times a day (BID) | ORAL | Status: DC
  Administered 2014-07-12 – 2014-07-14 (×4): 1 via ORAL
  Filled 2014-07-12 (×5): qty 1

## 2014-07-12 MED ORDER — ENOXAPARIN SODIUM 80 MG/0.8ML ~~LOC~~ SOLN
1.0000 mg/kg | SUBCUTANEOUS | Status: DC
  Administered 2014-07-12: 65 mg via SUBCUTANEOUS
  Filled 2014-07-12 (×2): qty 0.8

## 2014-07-12 NOTE — Progress Notes (Addendum)
ANTICOAGULATION CONSULT NOTE - Follow-up Consult  Pharmacy Consult for Lovenox Indication: chronic Lovenox for DVT  Allergies  Allergen Reactions  . Ointment Base [Lanolin-Petrolatum] Rash    Specifically burn ointments.   Patient Measurements: Height: 5\' 5"  (165.1 cm) Weight: 145 lb (65.772 kg) IBW/kg (Calculated) : 57  Vital Signs: Temp: 98.7 F (37.1 C) (08/13 0608) Temp src: Oral (08/13 0608) BP: 121/60 mmHg (08/13 0608) Pulse Rate: 67 (08/13 0608)  Labs:  Recent Labs  07/11/14 1717 07/11/14 2122  HGB 11.8* 10.7*  HCT 34.5* 31.9*  PLT 355 322  CREATININE 3.12* 2.56*   Estimated Creatinine Clearance: 20.5 ml/min (by C-G formula based on Cr of 2.56).  Medical History: Past Medical History  Diagnosis Date  . Medical history non-contributory   . HDL deficiency   . Allergy     seasonal allergic rhinitis  . History of radiation therapy 10/16/13-11/27/13    rectal 50.4Gy  . Cancer 09/19/13    rectum  . Peripheral vascular disease 4/15    DVT LEFT LEG   Medications:  Scheduled:  . antiseptic oral rinse  7 mL Mouth Rinse q12n4p  . chlorhexidine  15 mL Mouth Rinse BID  . cycloSPORINE  1 drop Both Eyes BID  . enoxaparin (LOVENOX) injection  1 mg/kg Subcutaneous Q24H  . piperacillin-tazobactam (ZOSYN)  IV  2.25 g Intravenous 3 times per day  . vancomycin  750 mg Intravenous Q24H   Assessment: 7 yoF with several day hx of nausea/constipation, fever, chills, stated 20 lb weight loss. SBO at anastomosis, gallstones, and RLL infiltrate on CT. Hx of rectal Ca, completed Xeloda 6/15.  Chronic Lovenox 100mg  daily PTA for treatment of DVT, last dose noted 8/11  Acute renal failure, slight improvement overnight, Cl remains ~ 21 ml/min  No issues noted with bleeding  CBC stable  Weight updated to 66kg  Goal of Therapy:  4 hour AntiXa level = 0.6 - 1 unit/ml Monitor platelets by anticoagulation protocol: Yes   Plan:   Adjust Lovenox to 65mg  daily (1mg /kg q  24 hr with renal failure)  Adjust Lovenox dose as renal function changes  Ralene Bathe, PharmD, BCPS 07/12/2014, 7:53 AM  Pager: [phone]

## 2014-07-12 NOTE — Progress Notes (Signed)
<principal problem not specified>  Subjective: Pt feeling a little better.  Had a BM.  Denies abd pain  Objective: Vital signs in last 24 hours: Temp:  [98.1 F (36.7 C)-99.5 F (37.5 C)] 98.7 F (37.1 C) (08/13 0608) Pulse Rate:  [67-94] 67 (08/13 0608) Resp:  [16-24] 18 (08/13 0608) BP: (120-128)/(58-78) 121/60 mmHg (08/13 0608) SpO2:  [90 %-97 %] 97 % (08/13 8416) Weight:  [141 lb 2 oz (64.014 kg)-145 lb (65.772 kg)] 145 lb (65.772 kg) (08/13 6063) Last BM Date: 07/11/14  Intake/Output from previous day: 08/12 0701 - 08/13 0700 In: 1116.3 [I.V.:566.3; IV Piggyback:550] Out: -  Intake/Output this shift:    General appearance: alert and cooperative GI: normal findings: soft, non-tender  Lab Results:  Results for orders placed during the hospital encounter of 07/11/14 (from the past 24 hour(s))  CBC WITH DIFFERENTIAL     Status: Abnormal   Collection Time    07/11/14  5:17 PM      Result Value Ref Range   WBC 9.5  4.0 - 10.5 K/uL   RBC 4.22  3.87 - 5.11 MIL/uL   Hemoglobin 11.8 (*) 12.0 - 15.0 g/dL   HCT 34.5 (*) 36.0 - 46.0 %   MCV 81.8  78.0 - 100.0 fL   MCH 28.0  26.0 - 34.0 pg   MCHC 34.2  30.0 - 36.0 g/dL   RDW 14.7  11.5 - 15.5 %   Platelets 355  150 - 400 K/uL   Neutrophils Relative % 75  43 - 77 %   Lymphocytes Relative 8 (*) 12 - 46 %   Monocytes Relative 17 (*) 3 - 12 %   Eosinophils Relative 0  0 - 5 %   Basophils Relative 0  0 - 1 %   Neutro Abs 7.1  1.7 - 7.7 K/uL   Lymphs Abs 0.8  0.7 - 4.0 K/uL   Monocytes Absolute 1.6 (*) 0.1 - 1.0 K/uL   Eosinophils Absolute 0.0  0.0 - 0.7 K/uL   Basophils Absolute 0.0  0.0 - 0.1 K/uL   RBC Morphology POLYCHROMASIA PRESENT     WBC Morphology TOXIC GRANULATION    COMPREHENSIVE METABOLIC PANEL     Status: Abnormal   Collection Time    07/11/14  5:17 PM      Result Value Ref Range   Sodium 123 (*) 137 - 147 mEq/L   Potassium 3.4 (*) 3.7 - 5.3 mEq/L   Chloride 72 (*) 96 - 112 mEq/L   CO2 40 (*) 19 - 32 mEq/L    Glucose, Bld 110 (*) 70 - 99 mg/dL   BUN 69 (*) 6 - 23 mg/dL   Creatinine, Ser 3.12 (*) 0.50 - 1.10 mg/dL   Calcium 9.8  8.4 - 10.5 mg/dL   Total Protein 8.8 (*) 6.0 - 8.3 g/dL   Albumin 3.4 (*) 3.5 - 5.2 g/dL   AST 18  0 - 37 U/L   ALT 11  0 - 35 U/L   Alkaline Phosphatase 98  39 - 117 U/L   Total Bilirubin 0.3  0.3 - 1.2 mg/dL   GFR calc non Af Amer 15 (*) >90 mL/min   GFR calc Af Amer 17 (*) >90 mL/min   Anion gap 11  5 - 15  I-STAT CG4 LACTIC ACID, ED     Status: None   Collection Time    07/11/14  5:31 PM      Result Value Ref Range   Lactic Acid,  Venous 1.84  0.5 - 2.2 mmol/L  URINALYSIS, ROUTINE W REFLEX MICROSCOPIC     Status: Abnormal   Collection Time    07/11/14  5:39 PM      Result Value Ref Range   Color, Urine YELLOW  YELLOW   APPearance CLOUDY (*) CLEAR   Specific Gravity, Urine 1.012  1.005 - 1.030   pH 7.0  5.0 - 8.0   Glucose, UA NEGATIVE  NEGATIVE mg/dL   Hgb urine dipstick SMALL (*) NEGATIVE   Bilirubin Urine NEGATIVE  NEGATIVE   Ketones, ur NEGATIVE  NEGATIVE mg/dL   Protein, ur 30 (*) NEGATIVE mg/dL   Urobilinogen, UA 0.2  0.0 - 1.0 mg/dL   Nitrite NEGATIVE  NEGATIVE   Leukocytes, UA NEGATIVE  NEGATIVE  URINE MICROSCOPIC-ADD ON     Status: Abnormal   Collection Time    07/11/14  5:39 PM      Result Value Ref Range   Squamous Epithelial / LPF RARE  RARE   WBC, UA 0-2  <3 WBC/hpf   RBC / HPF 3-6  <3 RBC/hpf   Bacteria, UA FEW (*) RARE  HIV ANTIBODY (ROUTINE TESTING)     Status: None   Collection Time    07/11/14  9:22 PM      Result Value Ref Range   HIV 1&2 Ab, 4th Generation NONREACTIVE  NONREACTIVE  COMPREHENSIVE METABOLIC PANEL     Status: Abnormal   Collection Time    07/11/14  9:22 PM      Result Value Ref Range   Sodium 126 (*) 137 - 147 mEq/L   Potassium 3.3 (*) 3.7 - 5.3 mEq/L   Chloride 79 (*) 96 - 112 mEq/L   CO2 35 (*) 19 - 32 mEq/L   Glucose, Bld 96  70 - 99 mg/dL   BUN 61 (*) 6 - 23 mg/dL   Creatinine, Ser 2.56 (*) 0.50 -  1.10 mg/dL   Calcium 8.8  8.4 - 10.5 mg/dL   Total Protein 7.7  6.0 - 8.3 g/dL   Albumin 3.0 (*) 3.5 - 5.2 g/dL   AST 18  0 - 37 U/L   ALT 11  0 - 35 U/L   Alkaline Phosphatase 129 (*) 39 - 117 U/L   Total Bilirubin 0.3  0.3 - 1.2 mg/dL   GFR calc non Af Amer 19 (*) >90 mL/min   GFR calc Af Amer 22 (*) >90 mL/min   Anion gap 12  5 - 15  CBC     Status: Abnormal   Collection Time    07/11/14  9:22 PM      Result Value Ref Range   WBC 8.4  4.0 - 10.5 K/uL   RBC 3.91  3.87 - 5.11 MIL/uL   Hemoglobin 10.7 (*) 12.0 - 15.0 g/dL   HCT 31.9 (*) 36.0 - 46.0 %   MCV 81.6  78.0 - 100.0 fL   MCH 27.4  26.0 - 34.0 pg   MCHC 33.5  30.0 - 36.0 g/dL   RDW 14.9  11.5 - 15.5 %   Platelets 322  150 - 400 K/uL  PRO B NATRIURETIC PEPTIDE     Status: Abnormal   Collection Time    07/11/14  9:22 PM      Result Value Ref Range   Pro B Natriuretic peptide (BNP) 128.8 (*) 0 - 125 pg/mL     Studies/Results Radiology     MEDS, Scheduled . antiseptic oral rinse  7 mL  Mouth Rinse q12n4p  . chlorhexidine  15 mL Mouth Rinse BID  . cycloSPORINE  1 drop Both Eyes BID  . enoxaparin (LOVENOX) injection  60 mg Subcutaneous Q24H  . piperacillin-tazobactam (ZOSYN)  IV  2.25 g Intravenous 3 times per day  . vancomycin  750 mg Intravenous Q24H     Assessment: <principal problem not specified> "partial sbo"- Pt having bowel function.  Most likely pt has a narrow area at her small bowel anastomosis due to inflammation after surgery and not a true obstruction.    Plan: Will try some clears today.  If tolerates this, can advance to full liquids with nutritional shakes tomorrow.  Pt will most likely need to stay on full liquids for several weeks until this resolves.    LOS: 1 day    Rosario Adie, MD [facility], [place]   07/12/2014 7:25 AM

## 2014-07-12 NOTE — Progress Notes (Signed)
INITIAL NUTRITION ASSESSMENT  Patient meets the criteria for severe MALNUTRITION in the context of chronic illness with 11% weight loss in 1 month and PO intake <75% of estimated needs.   DOCUMENTATION CODES Per approved criteria  -Severe malnutrition in the context of chronic illness   INTERVENTION: -Resource Breeze TID while on clear liquids, each provides 250 kcal, 9 g protein,  -When diet advanced, will change supplements to Boost shakes TID, each providing 360 kcal, 14 g protein.  -Please consult when patient more alert for full liquid/soft diet education. Patient will require at least 3 Boost or equivalent nutrition supplements daily after discharge to meet nutritional needs.   NUTRITION DIAGNOSIS: Inadequate oral intake related to SBO as evidenced by clear liquid diet.   Goal: Patient will meet >/=90% of estimated nutrition needs  Monitor:  Diet advancement, PO intake, weights, labs  Reason for Assessment: Malnutrition screening tool  63 y.o. female  Admitting Dx: SBO (small bowel obstruction)  ASSESSMENT: 63 year old female with rectal cancer s/p Xeloda, completed in June 2015, with recent ileostomy reversal 05/16/2014. Presents with progressively worsening malaise, nausea, poor oral intake, constipation, and weight loss. Found to have partial SBO.   Patient's diet currently clear liquids with plans to advance to full liquids tomorrow if she tolerates POs. Patient has not been eating well for at least a month. She has lost about 18 pounds in the last month (11% weight loss). She reports drinking 1 Boost shake daily at home, but minimal other intake.   Per MD/surgery, patient will likely be discharged on a full liquid/soft diet, and will need to follow that diet for at least several weeks. RD attempted to educate patient on this, but she was tired/lethargic, and not appropriate for education.   Height: Ht Readings from Last 1 Encounters:  07/11/14 5\' 5"  (1.651 m)     Weight: Wt Readings from Last 1 Encounters:  07/12/14 145 lb (65.772 kg)    Ideal Body Weight: 125 pounds   % Ideal Body Weight: 116%  Wt Readings from Last 10 Encounters:  07/12/14 145 lb (65.772 kg)  06/20/14 161 lb 11.2 oz (73.347 kg)  06/19/14 161 lb 6.4 oz (73.211 kg)  06/07/14 163 lb (73.936 kg)  06/07/14 163 lb (73.936 kg)  05/29/14 163 lb (73.936 kg)  04/27/14 157 lb 3.2 oz (71.305 kg)  04/05/14 159 lb 8 oz (72.349 kg)  03/15/14 159 lb 4.8 oz (72.258 kg)  02/28/14 153 lb 9.6 oz (69.673 kg)    Usual Body Weight: 163 pounds  % Usual Body Weight: 89%  BMI:  Body mass index is 24.13 kg/(m^2). Patient is normal weight.   Estimated Nutritional Needs: Kcal: 1600-1750 kcal Protein: 80-95 g Fluid: >2.3 L/day  Skin: Intact  Diet Order: Clear Liquid  EDUCATION NEEDS: -Education not appropriate at this time   Intake/Output Summary (Last 24 hours) at 07/12/14 1154 Last data filed at 07/12/14 0900  Gross per 24 hour  Intake 1236.25 ml  Output      0 ml  Net 1236.25 ml    Last BM: 8/13   Labs:   Recent Labs Lab 07/11/14 1717 07/11/14 2122  NA 123* 126*  K 3.4* 3.3*  CL 72* 79*  CO2 40* 35*  BUN 69* 61*  CREATININE 3.12* 2.56*  CALCIUM 9.8 8.8  GLUCOSE 110* 96    CBG (last 3)  No results found for this basename: GLUCAP,  in the last 72 hours  Scheduled Meds: . antiseptic oral  rinse  7 mL Mouth Rinse q12n4p  . chlorhexidine  15 mL Mouth Rinse BID  . cycloSPORINE  1 drop Both Eyes BID  . enoxaparin (LOVENOX) injection  1 mg/kg Subcutaneous Q24H  . piperacillin-tazobactam (ZOSYN)  IV  2.25 g Intravenous 3 times per day  . vancomycin  750 mg Intravenous Q24H    Continuous Infusions: . sodium chloride 75 mL/hr at 07/11/14 2227    Past Medical History  Diagnosis Date  . Medical history non-contributory   . HDL deficiency   . Allergy     seasonal allergic rhinitis  . History of radiation therapy 10/16/13-11/27/13    rectal 50.4Gy  .  Cancer 09/19/13    rectum  . Peripheral vascular disease 4/15    DVT LEFT LEG    Past Surgical History  Procedure Laterality Date  . Left leg surgery  1986    rod from hip to knee  . Colonoscopy with propofol N/A 09/19/2013    Procedure: COLONOSCOPY WITH PROPOFOL;  Surgeon: Garlan Fair, MD;  Location: [facility];  Service: Endoscopy;  Laterality: N/A;  . Femur fracture surgery Left 1985    steel rod  . Eus N/A 09/27/2013    Procedure: LOWER ENDOSCOPIC ULTRASOUND (EUS);  Surgeon: Arta Silence, MD;  Location: [facility];  Service: Endoscopy;  Laterality: N/A;  . Laparoscopic low anterior resection N/A 01/12/2014    Procedure: LAPAROSCOPIC LOW ANTERIOR RESECTION ;  Surgeon: Leighton Ruff, MD;  Location: [facility];  Service: General;  Laterality: N/A;  . Ileo loop diversion N/A 01/12/2014    Procedure:   LOOP ILEOSTOMY;  Surgeon: Leighton Ruff, MD;  Location: [facility];  Service: General;  Laterality: N/A;  . Ileostomy closure N/A 06/07/2014    Procedure: ILEOSTOMY reversal ;  Surgeon: Leighton Ruff, MD;  Location: [facility];  Service: General;  Laterality: N/A;    Larey Seat, RD, LDN Pager #: [phone] After-Hours Pager #: [phone]

## 2014-07-12 NOTE — Progress Notes (Signed)
Uneventful night for patient. Denies any pain or nausea. Pt slept through night. Will continue to monitor.

## 2014-07-12 NOTE — Progress Notes (Signed)
[facility] PROGRESS NOTE  Victoria [name] NIO:[id number] DOB: 10-20-1951 DOA: 07/11/2014 PCP: Irven Shelling, MD  Assessment/Plan: 1-Partial SBO:  -Improving. -according to surgery service recommendations no anticipation of surgical intervention needed -Will continue as needed antiemetics and analgesics -Continue bowel regimen -Advanced to clear liquid diet -Currently without nausea or vomiting; will hold on NG tube.  2-Presumed HCAP -Continue empirically treatment with Vanc and Zosyn -start pulmicort and IS -follow sputum/blood cx's and urine antigen for legionella and strep penumo  3-rectal cancer: Status post resection. -Oncology has been notified of patient's admission. -She just completed Xeloda in June -will continue follow up with oncology as an outpatient  4-DVT: Continue treatment with Lovenox  5-hyponatremia/hypokalemia: Secondary to poor by mouth intake -Continue IV fluids and electrolyte supplementation as needed -Follow basic metabolic panel.  6-acute renal failure: Prerenal in nature secondary to decreased by mouth intake. -Will provide IV fluids -Follow urinalysis -Creatinine is already improving and on 8/13 is 2.5  7-severe protein calorie malnutrition: Patient will definitely benefit of significant intake of feeding supplements. -At this moment given clear liquid diet status will start her on breeze TID -Will follow recommendations per nutritional Service   Code Status: Full Family Communication: Husband at bedside Disposition Plan: Home when able to take by mouth   Consultants:  [facility]S  Procedures:  See below for x-ray reports  Antibiotics:  Vancomycin  Soles in  HPI/Subjective: Patient is feeling better; currently denying nausea or further episodes of vomiting. She is feeling weak and tired. Overnight low-grade fever was appreciated. Patient denies shortness of breath.  Objective: Filed Vitals:   07/12/14 0608  BP:  121/60  Pulse: 67  Temp: 98.7 F (37.1 C)  Resp: 18    Intake/Output Summary (Last 24 hours) at 07/12/14 1405 Last data filed at 07/12/14 0900  Gross per 24 hour  Intake 1236.25 ml  Output      0 ml  Net 1236.25 ml   Filed Weights   07/11/14 2050 07/11/14 2218 07/12/14 0608  Weight: 64.014 kg (141 lb 2 oz) 65.454 kg (144 lb 4.8 oz) 65.772 kg (145 lb)    Exam:   General:  Alert, awake and oriented x3; low grade fever overnight (MAXIMUM TEMPERATURE of 99.5); feeling weak and tired. Currently denying nausea or vomiting. Patient reports having a bowel movement.  Cardiovascular: S1 and S2, no rubs, no gallops; no JVD  Respiratory: Scattered rhonchi, slight expiratory wheezing; no crackles  Abdomen: Soft, mild tenderness around incision with palpation; positive bowel sounds  Musculoskeletal: No edema, no cyanosis or clubbing.   Data Reviewed: Basic Metabolic Panel:  Recent Labs Lab 07/11/14 1717 07/11/14 2122  NA 123* 126*  K 3.4* 3.3*  CL 72* 79*  CO2 40* 35*  GLUCOSE 110* 96  BUN 69* 61*  CREATININE 3.12* 2.56*  CALCIUM 9.8 8.8   Liver Function Tests:  Recent Labs Lab 07/11/14 1717 07/11/14 2122  AST 18 18  ALT 11 11  ALKPHOS 98 129*  BILITOT 0.3 0.3  PROT 8.8* 7.7  ALBUMIN 3.4* 3.0*   CBC:  Recent Labs Lab 07/11/14 1717 07/11/14 2122  WBC 9.5 8.4  NEUTROABS 7.1  --   HGB 11.8* 10.7*  HCT 34.5* 31.9*  MCV 81.8 81.6  PLT 355 322   BNP (last 3 results)  Recent Labs  07/11/14 2122  PROBNP 128.8*      Studies: Ct Abdomen Pelvis Wo Contrast  07/11/2014   CLINICAL DATA:  Weight loss. Vomiting. One month status post  ileostomy reversal.  EXAM: CT ABDOMEN AND PELVIS WITHOUT CONTRAST  TECHNIQUE: Multidetector CT imaging of the abdomen and pelvis was performed following the standard protocol without IV contrast.  COMPARISON:  12/22/2013  FINDINGS: There are small bowel dilation extending from the duodenum, which measures 5.1 cm at the junction of  the second and third portions, through the jejunum, where the maximum diameter is 4.9 cm. The more distal small bowel is decompressed, which begins in the general location of the small bowel anastomosis staple line in the right mid abdomen. The colon is partly decompressed. Diverticula are noted along the sigmoid colon. No diverticulitis. No bowel wall thickening or adjacent inflammation. There is no free air. Stranding in the fat of the right mid abdomen is consistent with the ileostomy site. There is no hernia.  There is patchy airspace opacity in the right lower and middle lobes. Linear opacity is noted in the left lower lobe and base of the lingula of the left upper lobe, likely atelectasis. Heart is normal in size. No pleural effusions.  Vague low attenuation area measuring 11 mm in the right lobe of the liver not seen on the prior study. This is not well defined on this unenhanced study. Liver is otherwise unremarkable.  Gallbladder is abnormal. There is evidence of stones and likely sludge. Wall is poorly defined. Consider acute cholecystitis if this correlates clinically. The common bile duct is dilated to 7 mm. No duct stone is seen. There is dilation of the pancreatic duct centrally to just over 6 mm. Both the common bile duct and pancreatic duct are stable in size from the prior study.  Spleen is unremarkable. No pancreatic masses or inflammation. No adrenal masses. Kidneys, ureters and bladder are unremarkable. Uterus and adnexa are unremarkable.  There is stranding throughout the presacral fat. Anastomosis staples lie along the inferior aspect of the rectum near the anorectal junction.  No pathologically enlarged lymph nodes.  No ascites.  No free air.  IMPRESSION: 1. Partial small bowel obstruction. Transition point appears to be at the level of the small bowel anastomosis in the right mid abdomen. It may be from the anastomosis or from adjacent postsurgical scarring/adhesions or internal hernia. 2.  Multiple gallstones. Gallbladder wall is poorly defined. Consider acute cholecystitis if there are consistent clinical symptoms. 3. Patchy airspace opacity at the base of right lower lobe right middle lobe. This is suspicious for pneumonia. 4. No other acute findings.   Electronically Signed   By: Lajean Manes M.D.   On: 07/11/2014 19:34   Dg Abd 1 View  07/11/2014   CLINICAL DATA:  Abdominal pain  EXAM: ABDOMEN - 1 VIEW  COMPARISON:  12/22/2013  FINDINGS: Postsurgical changes are noted in the proximal left femur. Mild degenerative change of the lumbar spine is seen. Scattered large and small bowel gas is noted. A few loops of mildly dilated small bowel are identified in the left mid abdomen. This may represent a an ileus or partial small bowel obstruction. Postsurgical changes are noted on the right.  IMPRESSION: Changes suggestive of mild ileus or partial small bowel obstruction. Correlation with physical exam is recommended.   Electronically Signed   By: Inez Catalina M.D.   On: 07/11/2014 12:51    Scheduled Meds: . antiseptic oral rinse  7 mL Mouth Rinse q12n4p  . budesonide (PULMICORT) nebulizer solution  0.25 mg Nebulization BID  . chlorhexidine  15 mL Mouth Rinse BID  . cycloSPORINE  1 drop Both Eyes BID  .  enoxaparin (LOVENOX) injection  1 mg/kg Subcutaneous Q24H  . feeding supplement (RESOURCE BREEZE)  1 Container Oral TID BM  . piperacillin-tazobactam (ZOSYN)  IV  2.25 g Intravenous 3 times per day  . vancomycin  750 mg Intravenous Q24H   Continuous Infusions: . 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 If 7PM-7AM, please contact night-coverage at [email or web address], password  [facility] 07/12/2014, 2:05 PM  LOS: 1 day

## 2014-07-13 LAB — BASIC METABOLIC PANEL
Anion gap: 10 (ref 5–15)
BUN: 28 mg/dL — ABNORMAL HIGH (ref 6–23)
CO2: 32 mEq/L (ref 19–32)
Calcium: 8.5 mg/dL (ref 8.4–10.5)
Chloride: 94 mEq/L — ABNORMAL LOW (ref 96–112)
Creatinine, Ser: 1.64 mg/dL — ABNORMAL HIGH (ref 0.50–1.10)
GFR calc Af Amer: 38 mL/min — ABNORMAL LOW (ref >90)
GFR calc non Af Amer: 33 mL/min — ABNORMAL LOW (ref >90)
GLUCOSE: 107 mg/dL — AB (ref 70–99)
Potassium: 3.2 mEq/L — ABNORMAL LOW (ref 3.7–5.3)
SODIUM: 136 meq/L — AB (ref 137–147)

## 2014-07-13 LAB — PHOSPHORUS: PHOSPHORUS: 2.3 mg/dL (ref 2.3–4.6)

## 2014-07-13 LAB — MAGNESIUM: Magnesium: 2.6 mg/dL — ABNORMAL HIGH (ref 1.5–2.5)

## 2014-07-13 MED ORDER — VANCOMYCIN HCL IN DEXTROSE 1-5 GM/200ML-% IV SOLN
1000.0000 mg | INTRAVENOUS | Status: DC
  Administered 2014-07-13: 1000 mg via INTRAVENOUS
  Filled 2014-07-13 (×2): qty 200

## 2014-07-13 MED ORDER — BOOST PLUS PO LIQD
237.0000 mL | Freq: Three times a day (TID) | ORAL | Status: DC
  Administered 2014-07-13 – 2014-07-14 (×4): 237 mL via ORAL
  Filled 2014-07-13 (×5): qty 237

## 2014-07-13 MED ORDER — ENOXAPARIN SODIUM 100 MG/ML ~~LOC~~ SOLN
1.5000 mg/kg | SUBCUTANEOUS | Status: DC
  Administered 2014-07-13: 100 mg via SUBCUTANEOUS
  Filled 2014-07-13 (×2): qty 1

## 2014-07-13 MED ORDER — PIPERACILLIN-TAZOBACTAM 3.375 G IVPB
3.3750 g | Freq: Three times a day (TID) | INTRAVENOUS | Status: DC
  Administered 2014-07-13 – 2014-07-14 (×3): 3.375 g via INTRAVENOUS
  Filled 2014-07-13 (×4): qty 50

## 2014-07-13 MED ORDER — POTASSIUM CHLORIDE CRYS ER 20 MEQ PO TBCR
40.0000 meq | EXTENDED_RELEASE_TABLET | Freq: Two times a day (BID) | ORAL | Status: DC
  Administered 2014-07-13 – 2014-07-14 (×3): 40 meq via ORAL
  Filled 2014-07-13 (×4): qty 2

## 2014-07-13 MED ORDER — PNEUMOCOCCAL VAC POLYVALENT 25 MCG/0.5ML IJ INJ
0.5000 mL | INJECTION | INTRAMUSCULAR | Status: AC
  Administered 2014-07-14: 0.5 mL via INTRAMUSCULAR
  Filled 2014-07-13 (×2): qty 0.5

## 2014-07-13 NOTE — Progress Notes (Signed)
ANTICOAGULATION CONSULT NOTE - Follow-up Consult  Pharmacy Consult for Lovenox Indication: chronic Lovenox for DVT  Allergies  Allergen Reactions  . Ointment Base [Lanolin-Petrolatum] Rash    Specifically burn ointments.   Patient Measurements: Height: 5\' 5"  (165.1 cm) Weight: 152 lb 11.2 oz (69.264 kg) IBW/kg (Calculated) : 57  Vital Signs: Temp: 98.8 F (37.1 C) (08/14 0617) Temp src: Oral (08/14 0617) BP: 139/73 mmHg (08/14 0617) Pulse Rate: 77 (08/14 0617)  Labs:  Recent Labs  07/11/14 1717 07/11/14 2122 07/13/14 0513  HGB 11.8* 10.7*  --   HCT 34.5* 31.9*  --   PLT 355 322  --   CREATININE 3.12* 2.56* 1.64*   Estimated Creatinine Clearance: 34.8 ml/min (by C-G formula based on Cr of 1.64).  Medical History: Past Medical History  Diagnosis Date  . Medical history non-contributory   . HDL deficiency   . Allergy     seasonal allergic rhinitis  . History of radiation therapy 10/16/13-11/27/13    rectal 50.4Gy  . Cancer 09/19/13    rectum  . Peripheral vascular disease 4/15    DVT LEFT LEG   Medications:  Scheduled:  . antiseptic oral rinse  7 mL Mouth Rinse q12n4p  . budesonide (PULMICORT) nebulizer solution  0.25 mg Nebulization BID  . chlorhexidine  15 mL Mouth Rinse BID  . cycloSPORINE  1 drop Both Eyes BID  . enoxaparin (LOVENOX) injection  1 mg/kg Subcutaneous Q24H  . feeding supplement (RESOURCE BREEZE)  1 Container Oral TID BM  . piperacillin-tazobactam (ZOSYN)  IV  2.25 g Intravenous 3 times per day  . psyllium  1 packet Oral BID  . vancomycin  750 mg Intravenous Q24H   Assessment: 50 yoF with several day hx of nausea/constipation, fever, chills, stated 20 lb weight loss. SBO at anastomosis, gallstones, and RLL infiltrate on CT. Hx of rectal Ca, completed Xeloda 6/15.  Chronic Lovenox 100mg  daily PTA for treatment of DVT, last dose noted 8/11  Acute renal failure - improved Scr 3.12 --> 1.64, CrCl = 35ml/min  No issues noted with  bleeding  CBC: Hgb 10.7 (watch trend), pltc WNL  Weight updated to 66kg  Goal of Therapy:  4 hour AntiXa level = 0.6 - 1 unit/ml Monitor platelets by anticoagulation protocol: Yes   Plan:  For improved renal fx and CrCl >30ml/min, Adjust Lovenox to 100mg  daily (1.5mg /kg q 24 hr)  Adjust Lovenox dose as renal function changes  Doreene Eland, PharmD, BCPS.   Pager: [phone] 07/13/2014 11:11 AM

## 2014-07-13 NOTE — Progress Notes (Signed)
[facility] PROGRESS NOTE  XITLALY AULT KGY:[id number] DOB: [birth year] DOA: 07/11/2014 PCP: Irven Shelling, MD  Assessment/Plan: 1-Partial SBO:  -Improving. -no anticipation of surgical intervention needed; continue advancing diet; if tolerated will d/c home in am -Will continue as needed antiemetics and analgesics -Continue bowel regimen with metamucil and milk of magnesia -Currently without nausea or vomiting; plan is to advance to full liquid diet  2-Presumed HCAP -Continue empirically treatment with Vanc and Zosyn -continue pulmicort and IS -follow sputum/blood cx's and urine antigen for legionella and strep penumo -patient doing better and currently afebrile -if stable will d/c home in am to finish antibiotics by mouth.  3-rectal cancer: Status post resection. -Oncology has been notified of patient's admission. -She just completed Xeloda in June -will continue follow up with oncology as an outpatient  4-DVT: Continue treatment with Lovenox  5-hyponatremia/hypokalemia: Secondary to poor by mouth intake -follow electrolyte and continue repletion as needed  6-acute renal failure: Prerenal in nature secondary to decreased by mouth intake. -Will provide IV fluids, but change to 50cc/hr -UA no suggesting UTI -Creatinine is already improving and on 8/14 is 1.6  7-severe protein calorie malnutrition: Patient will definitely benefit of significant intake of feeding supplements. -At this moment given advance to full liquid diet, will start her on boost TID -Will follow recommendations per nutritional Service   Code Status: Full Family Communication: Husband at bedside Disposition Plan: Home when able to take by mouth   Consultants:  CCS  Procedures:  See below for x-ray reports  Antibiotics:  Vancomycin  zosyn  HPI/Subjective: Alert, awake and oriented x3; no fever. No nausea or vomiting. Positive BM and feeling better  overall  Objective: Filed Vitals:   07/13/14 0617  BP: 139/73  Pulse: 77  Temp: 98.8 F (37.1 C)  Resp: 24    Intake/Output Summary (Last 24 hours) at 07/13/14 1348 Last data filed at 07/13/14 0828  Gross per 24 hour  Intake 2637.5 ml  Output      0 ml  Net 2637.5 ml   Filed Weights   07/11/14 2218 07/12/14 0608 07/13/14 0617  Weight: 65.454 kg (144 lb 4.8 oz) 65.772 kg (145 lb) 69.264 kg (152 lb 11.2 oz)    Exam:   General:  Alert, awake and oriented x3; no fever. No nausea or vomiting. Positive BM and feeling better overall  Cardiovascular: S1 and S2, no rubs, no gallops; no JVD  Respiratory: Scattered rhonchi, slight expiratory wheezing; no crackles  Abdomen: Soft, mild tenderness around incision with palpation; positive bowel sounds  Musculoskeletal: No edema, no cyanosis or clubbing.   Data Reviewed: Basic Metabolic Panel:  Recent Labs Lab 07/11/14 1717 07/11/14 2122 07/13/14 0513  NA 123* 126* 136*  K 3.4* 3.3* 3.2*  CL 72* 79* 94*  CO2 40* 35* 32  GLUCOSE 110* 96 107*  BUN 69* 61* 28*  CREATININE 3.12* 2.56* 1.64*  CALCIUM 9.8 8.8 8.5  MG  --   --  2.6*  PHOS  --   --  2.3   Liver Function Tests:  Recent Labs Lab 07/11/14 1717 07/11/14 2122  AST 18 18  ALT 11 11  ALKPHOS 98 129*  BILITOT 0.3 0.3  PROT 8.8* 7.7  ALBUMIN 3.4* 3.0*   CBC:  Recent Labs Lab 07/11/14 1717 07/11/14 2122  WBC 9.5 8.4  NEUTROABS 7.1  --   HGB 11.8* 10.7*  HCT 34.5* 31.9*  MCV 81.8 81.6  PLT 355 322   BNP (last 3 results)  Recent Labs  07/11/14 2122  PROBNP 128.8*      Studies: Ct Abdomen Pelvis Wo Contrast  07/11/2014   CLINICAL DATA:  Weight loss. Vomiting. One month status post ileostomy reversal.  EXAM: CT ABDOMEN AND PELVIS WITHOUT CONTRAST  TECHNIQUE: Multidetector CT imaging of the abdomen and pelvis was performed following the standard protocol without IV contrast.  COMPARISON:  12/22/2013  FINDINGS: There are small bowel dilation  extending from the duodenum, which measures 5.1 cm at the junction of the second and third portions, through the jejunum, where the maximum diameter is 4.9 cm. The more distal small bowel is decompressed, which begins in the general location of the small bowel anastomosis staple line in the right mid abdomen. The colon is partly decompressed. Diverticula are noted along the sigmoid colon. No diverticulitis. No bowel wall thickening or adjacent inflammation. There is no free air. Stranding in the fat of the right mid abdomen is consistent with the ileostomy site. There is no hernia.  There is patchy airspace opacity in the right lower and middle lobes. Linear opacity is noted in the left lower lobe and base of the lingula of the left upper lobe, likely atelectasis. Heart is normal in size. No pleural effusions.  Vague low attenuation area measuring 11 mm in the right lobe of the liver not seen on the prior study. This is not well defined on this unenhanced study. Liver is otherwise unremarkable.  Gallbladder is abnormal. There is evidence of stones and likely sludge. Wall is poorly defined. Consider acute cholecystitis if this correlates clinically. The common bile duct is dilated to 7 mm. No duct stone is seen. There is dilation of the pancreatic duct centrally to just over 6 mm. Both the common bile duct and pancreatic duct are stable in size from the prior study.  Spleen is unremarkable. No pancreatic masses or inflammation. No adrenal masses. Kidneys, ureters and bladder are unremarkable. Uterus and adnexa are unremarkable.  There is stranding throughout the presacral fat. Anastomosis staples lie along the inferior aspect of the rectum near the anorectal junction.  No pathologically enlarged lymph nodes.  No ascites.  No free air.  IMPRESSION: 1. Partial small bowel obstruction. Transition point appears to be at the level of the small bowel anastomosis in the right mid abdomen. It may be from the anastomosis or  from adjacent postsurgical scarring/adhesions or internal hernia. 2. Multiple gallstones. Gallbladder wall is poorly defined. Consider acute cholecystitis if there are consistent clinical symptoms. 3. Patchy airspace opacity at the base of right lower lobe right middle lobe. This is suspicious for pneumonia. 4. No other acute findings.   Electronically Signed   By: Lajean Manes M.D.   On: 07/11/2014 19:34    Scheduled Meds: . antiseptic oral rinse  7 mL Mouth Rinse q12n4p  . budesonide (PULMICORT) nebulizer solution  0.25 mg Nebulization BID  . chlorhexidine  15 mL Mouth Rinse BID  . cycloSPORINE  1 drop Both Eyes BID  . enoxaparin (LOVENOX) injection  1.5 mg/kg Subcutaneous Q24H  . lactose free nutrition  237 mL Oral TID WC  . piperacillin-tazobactam (ZOSYN)  IV  3.375 g Intravenous 3 times per day  . psyllium  1 packet Oral BID  . vancomycin  1,000 mg Intravenous Q24H   Continuous Infusions: . sodium chloride 75 mL/hr at 07/12/14 1548     Time spent: < 30 minutes    Barton Dubois  [facility] Pager [phone]. If 7PM-7AM, please contact night-coverage  at [email or web address], password [facility] 07/13/2014, 1:48 PM  LOS: 2 days

## 2014-07-13 NOTE — Progress Notes (Signed)
SBO (small bowel obstruction)  Subjective: Pt feeling better.  Having bowel function.  Denies abd pain  Objective: Vital signs in last 24 hours: Temp:  [97.1 F (36.2 C)-98.8 F (37.1 C)] 98.8 F (37.1 C) (08/14 0617) Pulse Rate:  [77-82] 77 (08/14 0617) Resp:  [20-24] 24 (08/14 0617) BP: (125-139)/(61-73) 139/73 mmHg (08/14 0617) SpO2:  [97 %-100 %] 99 % (08/14 0617) Weight:  [152 lb 11.2 oz (69.264 kg)] 152 lb 11.2 oz (69.264 kg) (08/14 0617) Last BM Date: 07/12/14  Intake/Output from previous day: 08/13 0701 - 08/14 0700 In: 2637.5 [P.O.:600; I.V.:1787.5; IV Piggyback:250] Out: -  Intake/Output this shift:   General appearance: alert and cooperative GI: normal findings: soft, non-tender  Lab Results:  Results for orders placed during the hospital encounter of 07/11/14 (from the past 24 hour(s))  BASIC METABOLIC PANEL     Status: Abnormal   Collection Time    07/13/14  5:13 AM      Result Value Ref Range   Sodium 136 (*) 137 - 147 mEq/L   Potassium 3.2 (*) 3.7 - 5.3 mEq/L   Chloride 94 (*) 96 - 112 mEq/L   CO2 32  19 - 32 mEq/L   Glucose, Bld 107 (*) 70 - 99 mg/dL   BUN 28 (*) 6 - 23 mg/dL   Creatinine, Ser 1.64 (*) 0.50 - 1.10 mg/dL   Calcium 8.5  8.4 - 10.5 mg/dL   GFR calc non Af Amer 33 (*) >90 mL/min   GFR calc Af Amer 38 (*) >90 mL/min   Anion gap 10  5 - 15  PHOSPHORUS     Status: None   Collection Time    07/13/14  5:13 AM      Result Value Ref Range   Phosphorus 2.3  2.3 - 4.6 mg/dL  MAGNESIUM     Status: Abnormal   Collection Time    07/13/14  5:13 AM      Result Value Ref Range   Magnesium 2.6 (*) 1.5 - 2.5 mg/dL     Studies/Results Radiology     MEDS, Scheduled . antiseptic oral rinse  7 mL Mouth Rinse q12n4p  . budesonide (PULMICORT) nebulizer solution  0.25 mg Nebulization BID  . chlorhexidine  15 mL Mouth Rinse BID  . cycloSPORINE  1 drop Both Eyes BID  . enoxaparin (LOVENOX) injection  1 mg/kg Subcutaneous Q24H  . feeding  supplement (RESOURCE BREEZE)  1 Container Oral TID BM  . piperacillin-tazobactam (ZOSYN)  IV  2.25 g Intravenous 3 times per day  . psyllium  1 packet Oral BID  . vancomycin  750 mg Intravenous Q24H     Assessment: SBO (small bowel obstruction) "partial sbo"- Pt having bowel function.  Most likely pt has a narrow area at her small bowel anastomosis due to inflammation after surgery and not a true obstruction.    Plan: Will try full liquids today.  If tolerates this, can go home on full/pureed diet. Follow up in my office in 2-3 weeks.     LOS: 2 days    Rosario Adie, MD  [facility], [place]   07/13/2014 7:40 AM

## 2014-07-13 NOTE — Progress Notes (Signed)
NUTRITION FOLLOW UP  Intervention:   - D/C Resource Breeze - pt does not like it, requests Boost - Boost Plus TID - husband has been encouraging pt to drink 3 per day - Had long discussion with pt and husband on full liquid and puree diet - handouts provided with RD contact information - teach back method used - RD to continue to monitor   Nutrition Dx:   Inadequate oral intake related to SBO as evidenced by clear liquid diet -ongoing but now related to poor appetite as evidenced by pt report   Goal:   Patient will meet >/=90% of estimated nutrition needs - not met   Monitor:   Weights, labs, intake  Assessment:   63 year old female with rectal cancer s/p Xeloda, completed in June 2015, with recent ileostomy reversal 05/16/2014. Presents with progressively worsening malaise, nausea, poor oral intake, constipation, and weight loss. Found to have partial SBO.   8/13: -Patient's diet currently clear liquids with plans to advance to full liquids tomorrow if she tolerates POs. Patient has not been eating well for at least a month. She has lost about 18 pounds in the last month (11% weight loss). She reports drinking 1 Boost shake daily at home, but minimal other intake.  -Per MD/surgery, patient will likely be discharged on a full liquid/soft diet, and will need to follow that diet for at least several weeks. RD attempted to educate patient on this, but she was tired/lethargic, and not appropriate for education.   8/14: -Diet advanced to full liquids, pt has not had any yet -Requests Boost, will order -Per surgeon, plan is for pt to go home on full liquid/pureed diet   Height: Ht Readings from Last 1 Encounters:  07/11/14 5' 5"  (1.651 m)    Weight Status:   Wt Readings from Last 1 Encounters:  07/13/14 152 lb 11.2 oz (69.264 kg)  Admit wt         141 lb 2 oz (64 kg)  Re-estimated needs:  Kcal: 1600-1750 kcal  Protein: 80-95 g  Fluid: per MD   Skin: +1 RUE, LUE, RLE, LLE  edema  Diet Order: Full Liquid   Intake/Output Summary (Last 24 hours) at 07/13/14 1121 Last data filed at 07/13/14 2542  Gross per 24 hour  Intake 2877.5 ml  Output      0 ml  Net 2877.5 ml    Last BM: 8/13   Labs:   Recent Labs Lab 07/11/14 1717 07/11/14 2122 07/13/14 0513  NA 123* 126* 136*  K 3.4* 3.3* 3.2*  CL 72* 79* 94*  CO2 40* 35* 32  BUN 69* 61* 28*  CREATININE 3.12* 2.56* 1.64*  CALCIUM 9.8 8.8 8.5  MG  --   --  2.6*  PHOS  --   --  2.3  GLUCOSE 110* 96 107*    CBG (last 3)  No results found for this basename: GLUCAP,  in the last 72 hours  Scheduled Meds: . antiseptic oral rinse  7 mL Mouth Rinse q12n4p  . budesonide (PULMICORT) nebulizer solution  0.25 mg Nebulization BID  . chlorhexidine  15 mL Mouth Rinse BID  . cycloSPORINE  1 drop Both Eyes BID  . enoxaparin (LOVENOX) injection  1.5 mg/kg Subcutaneous Q24H  . feeding supplement (RESOURCE BREEZE)  1 Container Oral TID BM  . piperacillin-tazobactam (ZOSYN)  IV  2.25 g Intravenous 3 times per day  . psyllium  1 packet Oral BID  . vancomycin  750 mg  Intravenous Q24H    Continuous Infusions: . sodium chloride 75 mL/hr at 07/12/14 [place], RD, [place] [phone] Pager [phone] Weekend/After Hours Pager

## 2014-07-13 NOTE — Progress Notes (Signed)
ANTIBIOTIC CONSULT NOTE - follow-up  Pharmacy Consult for Vancomycin & Zosyn Indication: pneumonia  Allergies  Allergen Reactions  . Ointment Base [Lanolin-Petrolatum] Rash    Specifically burn ointments.   Patient Measurements: Height: 5\' 5"  (165.1 cm) Weight: 152 lb 11.2 oz (69.264 kg) IBW/kg (Calculated) : 57  Vital Signs: Temp: 98.8 F (37.1 C) (08/14 0617) Temp src: Oral (08/14 0617) BP: 139/73 mmHg (08/14 0617) Pulse Rate: 77 (08/14 0617) Intake/Output from previous day: 08/13 0701 - 08/14 0700 In: 2637.5 [P.O.:600; I.V.:1787.5; IV Piggyback:250] Out: -  Intake/Output from this shift: Total I/O In: 360 [P.O.:360] Out: -   Labs:  Recent Labs  07/11/14 1717 07/11/14 2122 07/13/14 0513  WBC 9.5 8.4  --   HGB 11.8* 10.7*  --   PLT 355 322  --   CREATININE 3.12* 2.56* 1.64*   Estimated Creatinine Clearance: 34.8 ml/min (by C-G formula based on Cr of 1.64). No results found for this basename: VANCOTROUGH, Corlis Leak, VANCORANDOM, Mayes, GENTPEAK, GENTRANDOM, TOBRATROUGH, TOBRAPEAK, TOBRARND, AMIKACINPEAK, AMIKACINTROU, AMIKACIN,  in the last 72 hours   Microbiology: Recent Results (from the past 720 hour(s))  URINE CULTURE     Status: None   Collection Time    07/11/14  5:39 PM      Result Value Ref Range Status   Specimen Description URINE, CLEAN CATCH   Final   Special Requests NONE   Final   Culture  Setup Time     Final   Value: 07/11/2014 22:20     Performed at [place]     Final   Value: 50,000 COLONIES/ML     Performed at [facility]   Culture     Final   Value: Multiple bacterial morphotypes present, none predominant. Suggest appropriate recollection if clinically indicated.     Performed at [facility]   Report Status 07/12/2014 FINAL   Final  CULTURE, BLOOD (ROUTINE X 2)     Status: None   Collection Time    07/11/14  9:22 PM      Result Value Ref Range Status   Specimen Description BLOOD  RIGHT ARM   Final   Special Requests BOTTLES DRAWN AEROBIC AND ANAEROBIC 5CC   Final   Culture  Setup Time     Final   Value: 07/12/2014 00:30     Performed at [facility]   Culture     Final   Value:        BLOOD CULTURE RECEIVED NO GROWTH TO DATE CULTURE WILL BE HELD FOR 5 DAYS BEFORE ISSUING A FINAL NEGATIVE REPORT     Performed at [facility]   Report Status PENDING   Incomplete  CULTURE, BLOOD (ROUTINE X 2)     Status: None   Collection Time    07/11/14  9:22 PM      Result Value Ref Range Status   Specimen Description BLOOD RIGHT HAND   Final   Special Requests BOTTLES DRAWN AEROBIC AND ANAEROBIC 5CC   Final   Culture  Setup Time     Final   Value: 07/12/2014 00:30     Performed at [facility]   Culture     Final   Value:        BLOOD CULTURE RECEIVED NO GROWTH TO DATE CULTURE WILL BE HELD FOR 5 DAYS BEFORE ISSUING A FINAL NEGATIVE REPORT     Performed at [facility]   Report Status PENDING  Incomplete    Anti-infectives   Start     Dose/Rate Route Frequency Ordered Stop   07/12/14 2300  vancomycin (VANCOCIN) IVPB 750 mg/150 ml premix     750 mg 150 mL/hr over 60 Minutes Intravenous Every 24 hours 07/11/14 2136     07/11/14 2300  vancomycin (VANCOCIN) 1,250 mg in sodium chloride 0.9 % 250 mL IVPB     1,250 mg 166.7 mL/hr over 90 Minutes Intravenous  Once 07/11/14 2124 07/12/14 0206   07/11/14 2200  piperacillin-tazobactam (ZOSYN) IVPB 2.25 g     2.25 g 100 mL/hr over 30 Minutes Intravenous 3 times per day 07/11/14 2123       Assessment: 60 yoF with several day hx of nausea/constipation, fever, chills, stated 20 lb weight loss. Narrowing (unlikely obstruction) at anastomosis, gallstones, and RLL infiltrate on CT. Hx of rectal Ca, completed Xeloda 6/15.  8/12 >> Vanc >> 8/12 >> Zosyn >>   Tmax: afebrile WBCs: WNL Renal: ARF - improved, normalized CrCl = 58ml/min  8/12: Blood x 2: NGTD 8/12 urine: 50K multiple morphologies 8/12:  Legionella , Strep pneumo: both neg  Drug level / dose changes info:   Goal of Therapy:  Vancomycin trough level 15-20 mcg/ml  Plan:  Make following changes d/t improved renal fx  (Day #2 vanco/zosyn)  Increase vancomycin to 1gm IV q24h   Check trough if vanomcyin to continue > additional 3-4 days  Change zosyn to 3.375gm IV q8h with 4h infusion  Doreene Eland, PharmD, BCPS.   Pager: [phone]  07/13/2014, 11:15 AM

## 2014-07-14 DIAGNOSIS — E860 Dehydration: Secondary | ICD-10-CM

## 2014-07-14 DIAGNOSIS — J189 Pneumonia, unspecified organism: Secondary | ICD-10-CM

## 2014-07-14 LAB — CEA: CEA: 57.8 ng/mL — ABNORMAL HIGH (ref 0.0–5.0)

## 2014-07-14 MED ORDER — POTASSIUM CHLORIDE CRYS ER 20 MEQ PO TBCR: 40.0000 meq | EXTENDED_RELEASE_TABLET | Freq: Every day | ORAL | Status: AC

## 2014-07-14 MED ORDER — PSYLLIUM 58.6 % PO POWD: 1.0000 | Freq: Two times a day (BID) | ORAL | Status: DC

## 2014-07-14 MED ORDER — IPRATROPIUM-ALBUTEROL 20-100 MCG/ACT IN AERS: 1.0000 | INHALATION_SPRAY | Freq: Four times a day (QID) | RESPIRATORY_TRACT | Status: AC | PRN

## 2014-07-14 MED ORDER — AMOXICILLIN-POT CLAVULANATE 875-125 MG PO TABS: 1.0000 | ORAL_TABLET | Freq: Two times a day (BID) | ORAL | Status: AC

## 2014-07-14 MED ORDER — BOOST PLUS PO LIQD: 237.0000 mL | Freq: Three times a day (TID) | ORAL | Status: AC

## 2014-07-14 NOTE — Progress Notes (Signed)
I have reviewed the assessment and plan of care and I agree with the assessment. Pt resting with husband at the bedside. SRP, RN

## 2014-07-14 NOTE — Progress Notes (Signed)
Patient ID: Victoria [name], female   DOB: 07-May-1951, 63 y.o.   MRN: [id number]    Subjective: Patient feeling well. Getting ready to go home. Denies any nausea or abdominal complaints.  Objective: Vital signs in last 24 hours: Temp:  [97.9 F (36.6 C)-99.3 F (37.4 C)] 98.2 F (36.8 C) (08/15 0542) Pulse Rate:  [77-84] 81 (08/15 0542) Resp:  [18-28] 18 (08/15 0542) BP: (129-140)/(65-80) 140/80 mmHg (08/15 0542) SpO2:  [95 %-100 %] 99 % (08/15 1000) Weight:  [154 lb 5.2 oz (70 kg)] 154 lb 5.2 oz (70 kg) (08/15 0557) Last BM Date: 07/12/14  Intake/Output from previous day: 08/14 0701 - 08/15 0700 In: 1387.5 [P.O.:600; I.V.:687.5; IV Piggyback:100] Out: -  Intake/Output this shift: Total I/O In: 360 [P.O.:360] Out: -   Appears well. Not examined as she is dressed in [place] go home  Lab Results:   Recent Labs  07/11/14 1717 07/11/14 2122  WBC 9.5 8.4  HGB 11.8* 10.7*  HCT 34.5* 31.9*  PLT 355 322   BMET  Recent Labs  07/11/14 2122 07/13/14 0513  NA 126* 136*  K 3.3* 3.2*  CL 79* 94*  CO2 35* 32  GLUCOSE 96 107*  BUN 61* 28*  CREATININE 2.56* 1.64*  CALCIUM 8.8 8.5     Studies/Results: No results found.  Anti-infectives: Anti-infectives   Start     Dose/Rate Route Frequency Ordered Stop   07/14/14 0000  amoxicillin-clavulanate (AUGMENTIN) 875-125 MG per tablet     1 tablet Oral 2 times daily 07/14/14 1224 07/19/14 2359   07/13/14 1800  vancomycin (VANCOCIN) IVPB 1000 mg/200 mL premix     1,000 mg 200 mL/hr over 60 Minutes Intravenous Every 24 hours 07/13/14 1123     07/13/14 1230  piperacillin-tazobactam (ZOSYN) IVPB 3.375 g     3.375 g 12.5 mL/hr over 240 Minutes Intravenous 3 times per day 07/13/14 1123     07/12/14 2300  vancomycin (VANCOCIN) IVPB 750 mg/150 ml premix  Status:  Discontinued     750 mg 150 mL/hr over 60 Minutes Intravenous Every 24 hours 07/11/14 2136 07/13/14 1123   07/11/14 2300  vancomycin (VANCOCIN) 1,250 mg in sodium  chloride 0.9 % 250 mL IVPB     1,250 mg 166.7 mL/hr over 90 Minutes Intravenous  Once 07/11/14 2124 07/12/14 0206   07/11/14 2200  piperacillin-tazobactam (ZOSYN) IVPB 2.25 g  Status:  Discontinued     2.25 g 100 mL/hr over 30 Minutes Intravenous 3 times per day 07/11/14 2123 07/13/14 1123      Assessment/Plan: Doing well without current surgical concerns. Followup with Dr. Marcello Moores in one week.     LOS: 3 days    Cherish Runde T 07/14/2014

## 2014-07-14 NOTE — Discharge Summary (Signed)
Physician Discharge Summary  Victoria [name] YQI:[id number] DOB: [birth year] DOA: 07/11/2014  PCP: Irven Shelling, MD  Admit date: 07/11/2014 Discharge date: 07/14/2014  Time spent: >30 minutes  Recommendations for Outpatient Follow-up:  CBC to follow Hgb and WBC's trend BMET to follow electrolytes and renal function Repeat CXR in 3-4 weeks to follow resolution of infiltrates  Discharge Diagnoses:  Principal Problem:   SBO (small bowel obstruction) Active Problems:   Rectal cancer [id number] s/p lap LAR/loop ileostomy   Adult failure to thrive   Loss of weight   Acute renal failure   Protein-calorie malnutrition, severe   Discharge Condition: stable and improved. Discharge home with husband care.  Diet recommendation: full liquid diet/soft diet. Advise to take 3 feeding supplements between meals  Filed Weights   07/12/14 0608 07/13/14 0617 07/14/14 0557  Weight: 65.772 kg (145 lb) 69.264 kg (152 lb 11.2 oz) 70 kg (154 lb 5.2 oz)    History of present illness:  63 yo female with rectal cancer and status post Xeloda, completed in June 2015, recent ileostomy reversal 05/16/2014, DVT (currently on Lovenox), presented to [facility] ED with main concern of several days duration of progressively worsening malaise, nausea, poor oral intake, constipation, weight loss of 20 lbs. She reports associated subjective fevers, chills, occasional episodes of cough productive of clear sputum, intermittent episodes of epigastric discomfort but no abd pain at this time. She denies chest pain and shortness of breath.    Hospital Course:  1-Partial SBO:  -resolved.  -no anticipation of surgical intervention needed; continue advancing diet towards soft diet -Will continue as needed antiemetics and analgesics  -Continue bowel regimen with metamucil and milk of magnesia  -follow up with [facility] in 2 weeks  2-Presumed HCAP  -afebrile, WBC's WNL; improved air movement and decrease coughing -will discharge on  augmentin to finish antibiotic therapy -continue IS and use PRN respimat inhaler for wheezing/SOB Blood cx's negative up to date; patient with neg legionella and strep pneumo antigens in urine  3-rectal cancer: Status post resection.  -Oncology has been notified of patient's admission.  -She just completed Xeloda in June  -will continue follow up with oncology as an outpatient -CEA 57.8   4-DVT: Continue treatment with Lovenox   5-hyponatremia/hypokalemia: Secondary to poor by mouth intake  -follow electrolytes and continue repletion as needed  6-acute renal failure: Prerenal in nature secondary to decreased by mouth intake.  -UA no suggesting UTI  -Creatinine close to baseline at discharge -follow BMET during outpatient visit -maintain good hydration  7-severe protein calorie malnutrition: Patient will definitely benefit of significant intake of feeding supplements.  -At this moment given advance to full liquid diet, will start her on boost TID  -plan is to slowly advance to soft diet -appreciate dietician inputs, teaching.education session. Patient feeling more prepare now to embrace better nutrition    Procedures: See below for x-ray reports   Consultations:  [facility]  Discharge Exam: Filed Vitals:   07/14/14 0542  BP: 140/80  Pulse: 81  Temp: 98.2 F (36.8 C)  Resp: 18   General: Alert, awake and oriented x3; no fever. No nausea or vomiting; breathing a lot better and tolerating full liquid diet. Cardiovascular: S1 and S2, no rubs, no gallops; no JVD  Respiratory: Scattered rhonchi, no wheezing, improved air movement, no crackles or rales Abdomen: Soft, mild tenderness around incisional area with palpation; positive bowel sounds. Had 2 BM's  Musculoskeletal: No edema, no cyanosis or clubbing.    Discharge Instructions You  were cared for by a hospitalist during your hospital stay. If you have any questions about your discharge medications or the care you received  while you were in the hospital after you are discharged, you can call the unit and asked to speak with the hospitalist on call if the hospitalist that took care of you is not available. Once you are discharged, your primary care physician will handle any further medical issues. Please note that NO REFILLS for any discharge medications will be authorized once you are discharged, as it is imperative that you return to your primary care physician (or establish a relationship with a primary care physician if you do not have one) for your aftercare needs so that they can reassess your need for medications and monitor your lab values.  Discharge Instructions   Discharge instructions    Complete by:  As directed   Take medications as prescribed Keep yourself well hydrated Follow a full liquid/soft diet as instructed Use metamucil twice a day everyday, along with milk of magnesia for constipation Follow up with Dr. Marcello Moores (surgery service) in 2 weeks Follow up with PCP in 10 days            Medication List    STOP taking these medications       loperamide 2 MG capsule  Commonly known as:  IMODIUM      TAKE these medications       amoxicillin-clavulanate 875-125 MG per tablet  Commonly known as:  AUGMENTIN  Take 1 tablet by mouth 2 (two) times daily.     cycloSPORINE 0.05 % ophthalmic emulsion  Commonly known as:  RESTASIS  Place 1 drop into both eyes 2 (two) times daily.     enoxaparin 100 MG/ML injection  Commonly known as:  LOVENOX  Inject 100 mg into the skin daily.     Ipratropium-Albuterol 20-100 MCG/ACT Aers respimat  Commonly known as:  COMBIVENT  Inhale 1 puff into the lungs every 6 (six) hours as needed for wheezing.     lactose free nutrition Liqd  Take 237 mLs by mouth 3 (three) times daily between meals.     MILK OF MAGNESIA 400 MG/5ML suspension  Generic drug:  magnesium hydroxide  Take 30 mLs by mouth daily as needed for mild constipation.      oxyCODONE-acetaminophen 5-325 MG per tablet  Commonly known as:  PERCOCET/ROXICET  Take 1 tablet by mouth every 4 (four) hours as needed for moderate pain.     potassium chloride SA 20 MEQ tablet  Commonly known as:  K-DUR,KLOR-CON  Take 2 tablets (40 mEq total) by mouth daily.     psyllium 58.6 % powder  Commonly known as:  METAMUCIL  Take 1 packet by mouth 2 (two) times daily. For constipation       Allergies  Allergen Reactions  . Ointment Base [Lanolin-Petrolatum] Rash    Specifically burn ointments.      The results of significant diagnostics from this hospitalization (including imaging, microbiology, ancillary and laboratory) are listed below for reference.    Significant Diagnostic Studies: Ct Abdomen Pelvis Wo Contrast  07/11/2014   CLINICAL DATA:  Weight loss. Vomiting. One month status post ileostomy reversal.  EXAM: CT ABDOMEN AND PELVIS WITHOUT CONTRAST  TECHNIQUE: Multidetector CT imaging of the abdomen and pelvis was performed following the standard protocol without IV contrast.  COMPARISON:  12/22/2013  FINDINGS: There are small bowel dilation extending from the duodenum, which measures 5.1 cm at the junction of  the second and third portions, through the jejunum, where the maximum diameter is 4.9 cm. The more distal small bowel is decompressed, which begins in the general location of the small bowel anastomosis staple line in the right mid abdomen. The colon is partly decompressed. Diverticula are noted along the sigmoid colon. No diverticulitis. No bowel wall thickening or adjacent inflammation. There is no free air. Stranding in the fat of the right mid abdomen is consistent with the ileostomy site. There is no hernia.  There is patchy airspace opacity in the right lower and middle lobes. Linear opacity is noted in the left lower lobe and base of the lingula of the left upper lobe, likely atelectasis. Heart is normal in size. No pleural effusions.  Vague low attenuation  area measuring 11 mm in the right lobe of the liver not seen on the prior study. This is not well defined on this unenhanced study. Liver is otherwise unremarkable.  Gallbladder is abnormal. There is evidence of stones and likely sludge. Wall is poorly defined. Consider acute cholecystitis if this correlates clinically. The common bile duct is dilated to 7 mm. No duct stone is seen. There is dilation of the pancreatic duct centrally to just over 6 mm. Both the common bile duct and pancreatic duct are stable in size from the prior study.  Spleen is unremarkable. No pancreatic masses or inflammation. No adrenal masses. Kidneys, ureters and bladder are unremarkable. Uterus and adnexa are unremarkable.  There is stranding throughout the presacral fat. Anastomosis staples lie along the inferior aspect of the rectum near the anorectal junction.  No pathologically enlarged lymph nodes.  No ascites.  No free air.  IMPRESSION: 1. Partial small bowel obstruction. Transition point appears to be at the level of the small bowel anastomosis in the right mid abdomen. It may be from the anastomosis or from adjacent postsurgical scarring/adhesions or internal hernia. 2. Multiple gallstones. Gallbladder wall is poorly defined. Consider acute cholecystitis if there are consistent clinical symptoms. 3. Patchy airspace opacity at the base of right lower lobe right middle lobe. This is suspicious for pneumonia. 4. No other acute findings.   Electronically Signed   By: Lajean Manes M.D.   On: 07/11/2014 19:34   Dg Abd 1 View  07/11/2014   CLINICAL DATA:  Abdominal pain  EXAM: ABDOMEN - 1 VIEW  COMPARISON:  12/22/2013  FINDINGS: Postsurgical changes are noted in the proximal left femur. Mild degenerative change of the lumbar spine is seen. Scattered large and small bowel gas is noted. A few loops of mildly dilated small bowel are identified in the left mid abdomen. This may represent a an ileus or partial small bowel obstruction.  Postsurgical changes are noted on the right.  IMPRESSION: Changes suggestive of mild ileus or partial small bowel obstruction. Correlation with physical exam is recommended.   Electronically Signed   By: Inez Catalina M.D.   On: 07/11/2014 12:51    Microbiology: Recent Results (from the past 240 hour(s))  URINE CULTURE     Status: None   Collection Time    07/11/14  5:39 PM      Result Value Ref Range Status   Specimen Description URINE, CLEAN CATCH   Final   Special Requests NONE   Final   Culture  Setup Time     Final   Value: 07/11/2014 22:20     Performed at [place]     Final   Value: 50,000  COLONIES/ML     Performed at [facility]     Final   Value: Multiple bacterial morphotypes present, none predominant. Suggest appropriate recollection if clinically indicated.     Performed at [facility]   Report Status 07/12/2014 FINAL   Final  CULTURE, BLOOD (ROUTINE X 2)     Status: None   Collection Time    07/11/14  9:22 PM      Result Value Ref Range Status   Specimen Description BLOOD RIGHT ARM   Final   Special Requests BOTTLES DRAWN AEROBIC AND ANAEROBIC 5CC   Final   Culture  Setup Time     Final   Value: 07/12/2014 00:30     Performed at [facility]   Culture     Final   Value:        BLOOD CULTURE RECEIVED NO GROWTH TO DATE CULTURE WILL BE HELD FOR 5 DAYS BEFORE ISSUING A FINAL NEGATIVE REPORT     Performed at [facility]   Report Status PENDING   Incomplete  CULTURE, BLOOD (ROUTINE X 2)     Status: None   Collection Time    07/11/14  9:22 PM      Result Value Ref Range Status   Specimen Description BLOOD RIGHT HAND   Final   Special Requests BOTTLES DRAWN AEROBIC AND ANAEROBIC 5CC   Final   Culture  Setup Time     Final   Value: 07/12/2014 00:30     Performed at [facility]   Culture     Final   Value:        BLOOD CULTURE RECEIVED NO GROWTH TO DATE CULTURE WILL BE HELD FOR 5 DAYS BEFORE  ISSUING A FINAL NEGATIVE REPORT     Performed at [facility]   Report Status PENDING   Incomplete     Labs: Basic Metabolic Panel:  Recent Labs Lab 07/11/14 1717 07/11/14 2122 07/13/14 0513  NA 123* 126* 136*  K 3.4* 3.3* 3.2*  CL 72* 79* 94*  CO2 40* 35* 32  GLUCOSE 110* 96 107*  BUN 69* 61* 28*  CREATININE 3.12* 2.56* 1.64*  CALCIUM 9.8 8.8 8.5  MG  --   --  2.6*  PHOS  --   --  2.3   Liver Function Tests:  Recent Labs Lab 07/11/14 1717 07/11/14 2122  AST 18 18  ALT 11 11  ALKPHOS 98 129*  BILITOT 0.3 0.3  PROT 8.8* 7.7  ALBUMIN 3.4* 3.0*   CBC:  Recent Labs Lab 07/11/14 1717 07/11/14 2122  WBC 9.5 8.4  NEUTROABS 7.1  --   HGB 11.8* 10.7*  HCT 34.5* 31.9*  MCV 81.8 81.6  PLT 355 322   BNP (last 3 results)  Recent Labs  07/11/14 2122  PROBNP 128.8*    Signed:  Barton Dubois  [facility] 07/14/2014, 12:25 PM

## 2014-07-14 NOTE — Progress Notes (Addendum)
Patient was stable at time of discharge. IV was removed. I reviewed discharge education with patient and husband. They verbalized understanding and had no further questions. Patient left with her prescriptions in hand.

## 2014-07-16 ENCOUNTER — Telehealth: Payer: Self-pay | Admitting: Oncology

## 2014-07-16 ENCOUNTER — Telehealth: Payer: Self-pay | Admitting: *Deleted

## 2014-07-16 ENCOUNTER — Telehealth (INDEPENDENT_AMBULATORY_CARE_PROVIDER_SITE_OTHER): Payer: Self-pay

## 2014-07-16 ENCOUNTER — Telehealth (INDEPENDENT_AMBULATORY_CARE_PROVIDER_SITE_OTHER): Payer: Self-pay | Admitting: General Surgery

## 2014-07-16 ENCOUNTER — Encounter (INDEPENDENT_AMBULATORY_CARE_PROVIDER_SITE_OTHER): Payer: Self-pay

## 2014-07-16 DIAGNOSIS — C20 Malignant neoplasm of rectum: Secondary | ICD-10-CM

## 2014-07-16 NOTE — Telephone Encounter (Signed)
Dr. Benay Spice discussed case with Dr. Marcello Moores, pt will need CT C/A/P. Orders entered. Will call pt with office visit appt. Called pt with this info. She understands to expect call from radiology schedulers.

## 2014-07-16 NOTE — Telephone Encounter (Signed)
Discuss CEA level with the patient, her husband and with Dr. Benay Spice. Dr. Gearldine Shown office will the CT scan of chest abdomen and pelvis. He will follow up with her on the results.

## 2014-07-16 NOTE — Telephone Encounter (Signed)
That's fine.  She is undergoing work up for possible recurrence of her cancer.  Please write her out of work for the next 2 weeks.  Any time after that should go through Dr Gearldine Shown office.

## 2014-07-16 NOTE — Telephone Encounter (Signed)
Pt was seen in the hospital for a partial SBO, she has also had a ileostomy reversal. Pt would like to know if Dr Marcello Moores would write her out of work until Sept 10. Imformed pt that I would send a message to Dr Marcello Moores to request this and we will contact her as soon as we receive a message. Pt verbalized understanding and agrees with plan.

## 2014-07-16 NOTE — Telephone Encounter (Signed)
Spk w/pt confirming labs/ov per 08/17 POF, pt is to req updated schedule at next apt...KJ

## 2014-07-17 ENCOUNTER — Other Ambulatory Visit (HOSPITAL_BASED_OUTPATIENT_CLINIC_OR_DEPARTMENT_OTHER): Payer: BC Managed Care – PPO

## 2014-07-17 DIAGNOSIS — C20 Malignant neoplasm of rectum: Secondary | ICD-10-CM

## 2014-07-17 DIAGNOSIS — I82409 Acute embolism and thrombosis of unspecified deep veins of unspecified lower extremity: Secondary | ICD-10-CM

## 2014-07-17 LAB — BASIC METABOLIC PANEL (CC13)
ANION GAP: 9 meq/L (ref 3–11)
BUN: 7.9 mg/dL (ref 7.0–26.0)
CO2: 24 mEq/L (ref 22–29)
Calcium: 9.7 mg/dL (ref 8.4–10.4)
Chloride: 105 mEq/L (ref 98–109)
Creatinine: 1.2 mg/dL — ABNORMAL HIGH (ref 0.6–1.1)
Glucose: 112 mg/dl (ref 70–140)
POTASSIUM: 4.4 meq/L (ref 3.5–5.1)
Sodium: 138 mEq/L (ref 136–145)

## 2014-07-17 LAB — CEA: CEA: 53.2 ng/mL — ABNORMAL HIGH (ref 0.0–5.0)

## 2014-07-18 ENCOUNTER — Telehealth: Payer: Self-pay | Admitting: *Deleted

## 2014-07-18 ENCOUNTER — Encounter (HOSPITAL_COMMUNITY): Payer: Self-pay

## 2014-07-18 ENCOUNTER — Ambulatory Visit (HOSPITAL_COMMUNITY)
Admission: RE | Admit: 2014-07-18 | Discharge: 2014-07-18 | Disposition: A | Discharge status: Home / Self Care | Payer: BC Managed Care – PPO | Source: Ambulatory Visit | Attending: Oncology | Admitting: Oncology

## 2014-07-18 DIAGNOSIS — K7689 Other specified diseases of liver: Secondary | ICD-10-CM | POA: Diagnosis not present

## 2014-07-18 DIAGNOSIS — K8020 Calculus of gallbladder without cholecystitis without obstruction: Secondary | ICD-10-CM | POA: Insufficient documentation

## 2014-07-18 DIAGNOSIS — R970 Elevated carcinoembryonic antigen [CEA]: Secondary | ICD-10-CM | POA: Insufficient documentation

## 2014-07-18 DIAGNOSIS — C20 Malignant neoplasm of rectum: Secondary | ICD-10-CM | POA: Insufficient documentation

## 2014-07-18 LAB — CULTURE, BLOOD (ROUTINE X 2)
CULTURE: NO GROWTH
Culture: NO GROWTH

## 2014-07-18 MED ORDER — IOHEXOL 300 MG/ML  SOLN
100.0000 mL | Freq: Once | INTRAMUSCULAR | Status: AC | PRN
  Administered 2014-07-18: 100 mL via INTRAVENOUS

## 2014-07-18 NOTE — Telephone Encounter (Signed)
Per Dr. Cristal Generous office and pt message re: port placement---Per Dr. Benay Spice; order placed for [facility] to schedule port placement d/t Dr. Donne Hazel not available at this time.  Left message on pt home # that schedulers will call notifying her date/time of procedure and to call office if any questions.

## 2014-07-19 ENCOUNTER — Ambulatory Visit: Payer: BC Managed Care – PPO | Admitting: Nurse Practitioner

## 2014-07-20 ENCOUNTER — Ambulatory Visit (HOSPITAL_BASED_OUTPATIENT_CLINIC_OR_DEPARTMENT_OTHER): Payer: BC Managed Care – PPO | Admitting: Nurse Practitioner

## 2014-07-20 ENCOUNTER — Telehealth: Payer: Self-pay | Admitting: Oncology

## 2014-07-20 VITALS — BP 143/83 | HR 113 | Temp 98.2°F | Resp 19 | Ht 65.0 in | Wt 151.5 lb

## 2014-07-20 DIAGNOSIS — R109 Unspecified abdominal pain: Secondary | ICD-10-CM

## 2014-07-20 DIAGNOSIS — C20 Malignant neoplasm of rectum: Secondary | ICD-10-CM

## 2014-07-20 DIAGNOSIS — R5381 Other malaise: Secondary | ICD-10-CM

## 2014-07-20 DIAGNOSIS — R5383 Other fatigue: Secondary | ICD-10-CM | POA: Insufficient documentation

## 2014-07-20 DIAGNOSIS — R530 Neoplastic (malignant) related fatigue: Secondary | ICD-10-CM

## 2014-07-20 DIAGNOSIS — I82402 Acute embolism and thrombosis of unspecified deep veins of left lower extremity: Secondary | ICD-10-CM

## 2014-07-20 DIAGNOSIS — I82409 Acute embolism and thrombosis of unspecified deep veins of unspecified lower extremity: Secondary | ICD-10-CM

## 2014-07-20 NOTE — Telephone Encounter (Signed)
Pt confirmed labs/ov per 08/21 POF, gave pt AVS....KJ

## 2014-07-20 NOTE — Assessment & Plan Note (Signed)
Patient has been diagnosed recently with a left lower extremity DVT.  As she remains on Lovenox 1.5 mg per kilograms on a daily basis.  Ice patient she should remain on Lovenox daily for the time being.

## 2014-07-20 NOTE — Assessment & Plan Note (Addendum)
Patient completed Xeloda in early June 2015.  She had her ileostomy reversed abruptly one month ago per Dr. Leighton Ruff.  Her CEA has recently elevated to 54.  Patient is complaining of some new, date abdominal discomfort symptoms.  Restaging CT obtained at this week did reveal what could quite possibly be progression of her disease.  Patient does have some right upper quadrant tenderness on exam.  Patient has plans to followup with Dr. Leighton Ruff this coming Friday, 07/27/2014 for further evaluation of her abdominal discomfort.  Patient was advised to call or go directly to the emergency department if she develops any worsening abdominal discomfort, fever or chills over the weekend.

## 2014-07-20 NOTE — Assessment & Plan Note (Signed)
Patient is complaining of increased, vague abdominal discomfort; specifically to the right upper quadrant area.  Ileostomy reversal site does appear well-healed.  Patient does have some tenderness to the right upper quadrant with deep palpation.  No rebound tenderness noted on exam.  Patient has plans to follow up with Dr. Marcello Moores this coming Friday, 07/27/2014 for further evaluation.  Patient states that she does have Percocet to take on an as-needed basis for her pain.  She states that she has not required any pain medication so far.

## 2014-07-20 NOTE — Progress Notes (Signed)
[name]   Chief Complaint  Patient presents with  . Follow-up    HPI: Victoria [name] 63 y.o. female diagnosed with rectal cancer.  Patient is status post Xeloda completed in June 2015.  Patient had her ileostomy reversed per Dr. Leighton Ruff approximately one month ago.  It was noted during a routine blood draw that patient's CEA has elevated.  Patient is also complaining of increasing fatigue; and mild abdominal discomfort.  She states that her stomach is always growling.  She denies any specific nausea or vomiting.  She states that her bowel movements are regular.  She states that she does have Percocet that she can take at home for abdominal discomfort; but she has not had to take any pain medication so far.  Patient denies any recent fevers or chills.  Patient  obtained a restaging CT of the chest/abdomen/pelvis on 07/16/2014.  HPI  CURRENT THERAPY: No active treatment plan for patient.   ROS  Past Medical History  Diagnosis Date  . Medical history non-contributory   . HDL deficiency   . Allergy     seasonal allergic rhinitis  . History of radiation therapy 10/16/13-11/27/13    rectal 50.4Gy  . Peripheral vascular disease 4/15    DVT LEFT LEG  . Cancer 09/19/13    rectum    Past Surgical History  Procedure Laterality Date  . Left leg surgery  1986    rod from hip to knee  . Colonoscopy with propofol N/A 09/19/2013    Procedure: COLONOSCOPY WITH PROPOFOL;  Surgeon: Garlan Fair, MD;  Location: [facility];  Service: Endoscopy;  Laterality: N/A;  . Femur fracture surgery Left 1985    steel rod  . Eus N/A 09/27/2013    Procedure: LOWER ENDOSCOPIC ULTRASOUND (EUS);  Surgeon: Arta Silence, MD;  Location: [facility];  Service: Endoscopy;  Laterality: N/A;  . Laparoscopic low anterior resection N/A 01/12/2014    Procedure: LAPAROSCOPIC LOW ANTERIOR RESECTION ;  Surgeon: Leighton Ruff, MD;  Location: [facility];  Service: General;  Laterality:  N/A;  . Ileo loop diversion N/A 01/12/2014    Procedure:   LOOP ILEOSTOMY;  Surgeon: Leighton Ruff, MD;  Location: [facility];  Service: General;  Laterality: N/A;  . 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     is allergic to ointment base.    Medication List       This list is accurate as of: 07/20/14  2:05 PM.  Always use your most recent med list.               cycloSPORINE 0.05 % ophthalmic emulsion  Commonly known as:  RESTASIS  Place 1 drop into both eyes 2 (two) times daily.     enoxaparin 100 MG/ML injection  Commonly known as:  LOVENOX  Inject 100 mg into the skin daily.     Ipratropium-Albuterol 20-100 MCG/ACT Aers respimat  Commonly known as:  COMBIVENT  Inhale 1 puff into the lungs every 6 (six) hours as needed for wheezing.     lactose free nutrition Liqd  Take 237 mLs by mouth 3 (three) times daily between meals.     MILK OF MAGNESIA 400 MG/5ML suspension  Generic drug:  magnesium hydroxide  Take 30 mLs by mouth daily as needed for mild constipation.     oxyCODONE-acetaminophen 5-325 MG per tablet  Commonly known as:  PERCOCET/ROXICET  Take 1 tablet by mouth every 4 (four) hours as needed for moderate pain.     potassium chloride SA 20 MEQ tablet  Commonly known as:  K-DUR,KLOR-CON  Take 2 tablets (40 mEq total) by mouth daily.     psyllium 58.6 % powder  Commonly known as:  METAMUCIL  Take 1 packet by mouth 2 (two) times daily. For constipation         PHYSICAL EXAMINATION  Blood pressure 143/83,  pulse 113, temperature 98.2 F (36.8 C), temperature source Oral, resp. rate 19, height 5\' 5"  (1.651 m), weight 151 lb 8 oz (68.72 kg), SpO2 100.00%.  Physical Exam  Nursing note reviewed. Constitutional: She is oriented to person, place, and time and well-developed, well-nourished, and in no distress.  HENT:  Head: Normocephalic and atraumatic.  Eyes: Conjunctivae are normal. Pupils are equal, round, and reactive to light.  Neck: Normal range of motion. Neck supple.  Cardiovascular: Normal rate, regular rhythm and normal heart sounds.  Exam reveals no friction rub.   No murmur heard. Pulmonary/Chest: Effort normal and breath sounds normal. No respiratory distress.  Abdominal: Soft. Bowel sounds are normal. She exhibits no distension and no mass. There is tenderness. There is no rebound and no guarding.  Right upper quadrant tenderness with deep palpation.  Musculoskeletal: Normal range of motion. She exhibits no edema and no tenderness.  Lymphadenopathy:    She has no cervical adenopathy.  Neurological: She is alert and oriented to person, place, and time. Gait normal.  Skin: Skin is warm and dry.  Psychiatric: Affect normal.    LABORATORY DATA:. CBC  Lab Results  Component Value Date   WBC 8.4 07/11/2014   RBC 3.91 07/11/2014   HGB 10.7* 07/11/2014   HCT 31.9* 07/11/2014   PLT 322 07/11/2014   MCV 81.6 07/11/2014   MCH 27.4 07/11/2014   MCHC 33.5 07/11/2014   RDW 14.9 07/11/2014   LYMPHSABS 0.8 07/11/2014   MONOABS 1.6* 07/11/2014   EOSABS 0.0 07/11/2014   BASOSABS 0.0 07/11/2014     CMET  Lab Results  Component Value Date   NA 138 07/17/2014   K 4.4 07/17/2014   CL 94* 07/13/2014   CO2 24 07/17/2014   GLUCOSE 112 07/17/2014   BUN 7.9 07/17/2014   CREATININE 1.2* 07/17/2014   CALCIUM 9.7 07/17/2014   PROT 7.7 07/11/2014   ALBUMIN 3.0* 07/11/2014   AST 18 07/11/2014   ALT 11 07/11/2014   ALKPHOS 129* 07/11/2014   BILITOT 0.3 07/11/2014   GFRNONAA 33* 07/13/2014   GFRAA 38*  07/13/2014   CEA 53.2.    RADIOGRAPHIC STUDIES: acknowledged these results.  Electronically Signed  By: Rolm Baptise M.D.  On: 07/18/2014 14:37       Study Result    CLINICAL DATA: Rectal cancer. Elevated CEA.  EXAM:  CT CHEST, ABDOMEN, AND PELVIS WITH CONTRAST  TECHNIQUE:  Multidetector CT imaging of the chest, abdomen and pelvis was  performed following the standard protocol during bolus  administration of intravenous contrast.  CONTRAST: 156mL OMNIPAQUE IOHEXOL 300 MG/ML SOLN  COMPARISON: 12/22/2013 and 09/29/2013.  FINDINGS:  CT CHEST FINDINGS  Heart is normal size. Aorta is normal  caliber. No mediastinal,  hilar, or axillary adenopathy.  Small right pleural effusion is noted, new since prior study. Left  basilar atelectasis and right basilar atelectasis or consolidation,  also new since prior study. Posterior right upper lobe pulmonary  nodule on image 16 measures 5 mm, stable. No new pulmonary nodules.  Chest wall soft tissues are unremarkable.  CT ABDOMEN AND PELVIS FINDINGS  Gallstones are again noted within the gallbladder. In addition,  there is marked gallbladder wall thickening, measuring up to 11 mm  in thickness at the fundus. Mild gallbladder wall thickening was  noted on prior studies, but this is better seen on today's study,  likely related to increasing wall thickening and contrast bolus  timing. In addition, abnormal low density noted in the adjacent  inferior liver with small scattered satellite nodules in the  adjacent liver. Index lesion measures 11 mm on image 49. These  findings within the liver are new since prior study. Diffuse fatty  infiltration of the liver. Small amount of adjacent perihepatic  fluid adjacent to the right hepatic lobe and the gallbladder. This  may reflect subcapsular fluid.  Spleen, pancreas, adrenals and kidneys are normal.  Postoperative changes in the region of the rectum. Extensive  edema/stranding within the pelvis  extending from the presacral space  anteriorly.  New area of sclerosis noted in the right pubic bone near the pubic  symphysis, nonspecific. No additional focal bone lesion or acute  bony abnormality.  IMPRESSION:  Postoperative changes in the region of the rectum. Extensive  stranding in the adjacent cul-de-sac and extending anteriorly within  the pelvis. While this may reflect postoperative changes, I cannot  exclude phlegmonous changes from infection or local tumor extension.  Recommend clinical correlation for signs of infection. Recommend  attention to this area on followup imaging.  Small bowel dilatation with transition in the lower pelvis. Findings  concerning for partial small bowel obstruction.  Cholelithiasis. Increasing gallbladder wall thickening, with new  extensive low density noted in the adjacent liver. Small satellite  rounded low-density lesions also noted in the liver, with  perihepatic or subcapsular fluid. While these changes could reflect  acute cholecystitis, the appearance is more concerning for possible  infiltrating process such is gallbladder wall cancer. Recommend MRI  with and without contrast for further evaluation.  Electronically Signed:  By: Rolm Baptise M.D.  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  She had her ileostomy reversed abruptly one month ago per Dr. Leighton Ruff.  Her CEA has recently elevated to 54.  Patient is complaining of some new, date abdominal discomfort symptoms.  Restaging CT obtained at this week did reveal what could quite possibly be progression of her disease.  Patient does have some right upper quadrant tenderness on exam.  Patient has plans to followup with Dr. Leighton Ruff this coming Tuesday, 07/24/2014 for further evaluation of her abdominal discomfort.  Patient was advised to call or go directly to the  emergency department if she develops any worsening abdominal discomfort, fever or chills over the weekend.   DVT (deep venous thrombosis)  Assessment & Plan Patient has been diagnosed recently with a left lower extremity DVT.  As she remains on Lovenox 1.5 mg per kilograms on a daily basis.  Ice patient she should remain on Lovenox daily for the time being.   Fatigue  Assessment & Plan  Patient is complaining of a slight increase in her fatigue.  Patient was encouraged to remain as active as possible.   Abdominal pain, unspecified site  Assessment & Plan Patient is complaining of increased, vague abdominal discomfort; specifically to the right upper quadrant area.  Ileostomy reversal site does appear well-healed.  Patient does have some tenderness to the right upper quadrant with deep palpation.  No rebound tenderness noted on exam.  Patient has plans to follow up with Dr. Marcello Moores this coming Friday, 07/27/2014 for further evaluation.  Patient states that she does have Percocet to take on an as-needed basis for her pain.  She states that she has not required any pain medication so far.   Patient stated understanding of all instructions; and was in agreement with this plan of care. The patient knows to call the clinic with any problems, questions or concerns.    This was a shared visit with Dr. Benay Spice today.   Total time spent with patient was  40 minutes;  with greater than  80 percent of that time spent in face to face counseling regarding  reviewing her symptoms, her lab results, and her scan results, and coordination of care and follow up.  Disclaimer: This note was dictated with voice recognition software. Similar sounding words can inadvertently be transcribed and may not be corrected upon review.   Drue Second, NP 07/20/2014   This was a shared visit with Drue Second. Ms. Bieker was interviewed and examined. She appears to have progressive rectal cancer. I discussed the  CT findings with Ms. Maricela Bo. I reviewed the images with her husband. Is unclear whether the inflammatory process at the gallbladder is related to a malignancy or infection. She does not have a fever and she appears clinically stable. I will present her case at the GI tumor conference 07/25/2014 and request Dr. Marcello Moores evaluate her. She will return for an office visit here 07/27/2014. She knows to contact us in the interim for increased abdominal discomfort or new symptoms. She does not have symptoms to suggest a bowel obstruction at present.  Julieanne Manson, M.D.

## 2014-07-20 NOTE — Telephone Encounter (Signed)
s.w. pt and advised on 8.28.15.Marland KitchenMarland Kitchenpt ok and aware

## 2014-07-20 NOTE — Assessment & Plan Note (Signed)
Patient is complaining of a slight increase in her fatigue.  Patient was encouraged to remain as active as possible.

## 2014-07-23 ENCOUNTER — Telehealth: Payer: Self-pay | Admitting: Oncology

## 2014-07-23 ENCOUNTER — Ambulatory Visit: Payer: BC Managed Care – PPO | Admitting: Oncology

## 2014-07-23 NOTE — Telephone Encounter (Signed)
returned pt husband call and confirmed appts... °

## 2014-07-24 ENCOUNTER — Ambulatory Visit (INDEPENDENT_AMBULATORY_CARE_PROVIDER_SITE_OTHER): Payer: BC Managed Care – PPO | Admitting: General Surgery

## 2014-07-24 VITALS — BP 130/78 | HR 113 | Temp 97.1°F | Ht 65.0 in | Wt 147.0 lb

## 2014-07-24 DIAGNOSIS — C20 Malignant neoplasm of rectum: Secondary | ICD-10-CM

## 2014-07-24 NOTE — Patient Instructions (Signed)
Someone will contact you after tomorrow's meeting with the resulting plan.

## 2014-07-24 NOTE — Progress Notes (Signed)
Victoria [name] is a 63 y.o. 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  She was recently hospitalized with dehydration.  She is doing better but still complains of abdominal pain and cramping.  The pain is midepigastric and crampy in nature.  It is not directly associated with eating or bowel movements. She notices pain radiating to her right shoulder. She denies any back pain.  Her most recent blood work showed an elevation in CEA and repeat CT scan show signs concerning for liver metastases.  Objective: Filed Vitals:   07/24/14 1116  BP: 130/78  Pulse: 113  Temp: 97.1 F (36.2 C)    General appearance: cooperative, mild distress and slowed mentation GI: normal findings: soft mild tenderness to palpation in midepigastric region, abdomen feels "full" to palpation   Assessment and Plan: Victoria [name] Is a 63 year old female who appears to have progressive metastatic disease of rectal cancer in origin. Her CT scan is concerning for liver metastases and possibly gallbladder involvement.  Her symptoms appear somewhat consistent with gallbladder disease. Other symptoms seem more consistent with constipation issues most likely related to her small bowel stricture from her ileostomy takedown anastomosis. We will discuss her case and tumor board conference tomorrow. We will then call the patient with the plan after this.    Rosario Adie, MD [facility], [place]

## 2014-07-25 ENCOUNTER — Encounter (INDEPENDENT_AMBULATORY_CARE_PROVIDER_SITE_OTHER): Payer: Self-pay

## 2014-07-25 ENCOUNTER — Ambulatory Visit: Payer: BC Managed Care – PPO | Admitting: Nurse Practitioner

## 2014-07-25 ENCOUNTER — Telehealth (INDEPENDENT_AMBULATORY_CARE_PROVIDER_SITE_OTHER): Payer: Self-pay

## 2014-07-25 NOTE — Telephone Encounter (Signed)
Pt states that when she was in the office to see Dr Marcello Moores she was having some issues with constipation. Dr Marcello Moores advised pt to take Miralax. This has not relieved her constipation at this time. Advised pt to double up on her Miralax and see if this gives her any relief. Pt would like to know if Dr Marcello Moores would care if she did a enema. Advised pt that I would send a message to Dr Marcello Moores requesting this. Informed pt that we would contact her as soon as we received a message from Dr Marcello Moores. Pt verbalized understanding.

## 2014-07-25 NOTE — Telephone Encounter (Signed)
An enema is fine

## 2014-07-26 ENCOUNTER — Telehealth: Payer: Self-pay

## 2014-07-26 ENCOUNTER — Telehealth: Payer: Self-pay | Admitting: *Deleted

## 2014-07-26 NOTE — Telephone Encounter (Signed)
PT. WAS USING HER SHOWER CHAIR. WHEN SHE STOOD UP BECAME VERY WEAK AND "LIGHT HEADED". HER HUSBAND HELPED HER LAID ON THE BED. SHE WAS ALERT AND TALKING. [B/P 151/69] PT.'S GRIPS BILATERALLY ARE EQUAL AND STRONG. PT HAS HAD SOME ABDOMINAL PAIN AND A LOT OF BELCHING. NO BOWEL MOVEMENT IN FOUR DAYS. PT.'S SURGEON, DR.ALICIA THOMAS IS AWARE OF PT.'S CONSTIPATION ISSUE AND HAS GIVEN INSTRUCTIONS. TIFFANY HILL,LPN WORKING WITH DR.SHERRILL ALSO RECEIVED A CALL FROM MR.Cosio EARLIER. SHE SPOKE WITH DR.SHERRILL WHO INSTRUCTED MR.Puckett TO TAKE HIS WIFE TO THE EMERGENCY ROOM. [place] HIS WIFE IS BETTER NOW SO WILL NOT BE GOING TO THE EMERGENCY ROOM BUT IF HIS WIFE'S CONDITION WORSENS HE WILL TAKE HER TO THE EMERGENCY ROOM.

## 2014-07-26 NOTE — Telephone Encounter (Signed)
Informed pt that Dr Marcello Moores states that it will be ok to do a enema. Pt verbalized understanding

## 2014-07-26 NOTE — Telephone Encounter (Signed)
Mr.Gama called and left message, having concerns about pt, states she is trembling and shaking like she is losing control of her body, states he is unsure of fever and BP seems to be close to normal. Informed Ned Card, NP and Dr. Benay Spice, MD. Per Dr. Benay Spice he wants her to go straight to emergency room d/t possible infection. Called patient back and informed her to go to the ER per Dr. Benay Spice and he will follow up. Pt verbalized understanding.

## 2014-07-27 ENCOUNTER — Encounter (INDEPENDENT_AMBULATORY_CARE_PROVIDER_SITE_OTHER): Payer: BC Managed Care – PPO | Admitting: General Surgery

## 2014-07-27 ENCOUNTER — Ambulatory Visit (HOSPITAL_BASED_OUTPATIENT_CLINIC_OR_DEPARTMENT_OTHER): Payer: BC Managed Care – PPO | Admitting: Nurse Practitioner

## 2014-07-27 ENCOUNTER — Inpatient Hospital Stay (HOSPITAL_COMMUNITY)
Admission: AD | Admit: 2014-07-27 | Discharge: 2014-08-13 | DRG: 335 | Disposition: A | Discharge status: Hospice | Payer: BC Managed Care – PPO | Source: Ambulatory Visit | Attending: Internal Medicine | Admitting: Internal Medicine

## 2014-07-27 ENCOUNTER — Encounter (HOSPITAL_COMMUNITY): Payer: Self-pay

## 2014-07-27 VITALS — BP 142/95 | HR 100 | Resp 19 | Ht 65.0 in | Wt 146.8 lb

## 2014-07-27 DIAGNOSIS — K8000 Calculus of gallbladder with acute cholecystitis without obstruction: Secondary | ICD-10-CM | POA: Diagnosis present

## 2014-07-27 DIAGNOSIS — Z86718 Personal history of other venous thrombosis and embolism: Secondary | ICD-10-CM

## 2014-07-27 DIAGNOSIS — Z639 Problem related to primary support group, unspecified: Secondary | ICD-10-CM

## 2014-07-27 DIAGNOSIS — Z515 Encounter for palliative care: Secondary | ICD-10-CM

## 2014-07-27 DIAGNOSIS — R112 Nausea with vomiting, unspecified: Secondary | ICD-10-CM | POA: Diagnosis present

## 2014-07-27 DIAGNOSIS — N059 Unspecified nephritic syndrome with unspecified morphologic changes: Secondary | ICD-10-CM | POA: Diagnosis present

## 2014-07-27 DIAGNOSIS — C787 Secondary malignant neoplasm of liver and intrahepatic bile duct: Secondary | ICD-10-CM | POA: Diagnosis present

## 2014-07-27 DIAGNOSIS — R259 Unspecified abnormal involuntary movements: Secondary | ICD-10-CM

## 2014-07-27 DIAGNOSIS — E873 Alkalosis: Secondary | ICD-10-CM | POA: Diagnosis present

## 2014-07-27 DIAGNOSIS — Z8249 Family history of ischemic heart disease and other diseases of the circulatory system: Secondary | ICD-10-CM | POA: Diagnosis not present

## 2014-07-27 DIAGNOSIS — K810 Acute cholecystitis: Secondary | ICD-10-CM

## 2014-07-27 DIAGNOSIS — R140 Abdominal distension (gaseous): Secondary | ICD-10-CM

## 2014-07-27 DIAGNOSIS — D630 Anemia in neoplastic disease: Secondary | ICD-10-CM | POA: Diagnosis present

## 2014-07-27 DIAGNOSIS — K660 Peritoneal adhesions (postprocedural) (postinfection): Secondary | ICD-10-CM | POA: Diagnosis present

## 2014-07-27 DIAGNOSIS — E871 Hypo-osmolality and hyponatremia: Secondary | ICD-10-CM

## 2014-07-27 DIAGNOSIS — Z888 Allergy status to other drugs, medicaments and biological substances status: Secondary | ICD-10-CM | POA: Diagnosis not present

## 2014-07-27 DIAGNOSIS — N178 Other acute kidney failure: Secondary | ICD-10-CM

## 2014-07-27 DIAGNOSIS — C20 Malignant neoplasm of rectum: Secondary | ICD-10-CM

## 2014-07-27 DIAGNOSIS — E874 Mixed disorder of acid-base balance: Secondary | ICD-10-CM | POA: Diagnosis present

## 2014-07-27 DIAGNOSIS — E43 Unspecified severe protein-calorie malnutrition: Secondary | ICD-10-CM

## 2014-07-27 DIAGNOSIS — R627 Adult failure to thrive: Secondary | ICD-10-CM | POA: Diagnosis present

## 2014-07-27 DIAGNOSIS — K56609 Unspecified intestinal obstruction, unspecified as to partial versus complete obstruction: Secondary | ICD-10-CM

## 2014-07-27 DIAGNOSIS — R011 Cardiac murmur, unspecified: Secondary | ICD-10-CM | POA: Diagnosis present

## 2014-07-27 DIAGNOSIS — N179 Acute kidney failure, unspecified: Secondary | ICD-10-CM | POA: Diagnosis present

## 2014-07-27 DIAGNOSIS — Z932 Ileostomy status: Secondary | ICD-10-CM

## 2014-07-27 DIAGNOSIS — Z79899 Other long term (current) drug therapy: Secondary | ICD-10-CM | POA: Diagnosis not present

## 2014-07-27 DIAGNOSIS — E878 Other disorders of electrolyte and fluid balance, not elsewhere classified: Secondary | ICD-10-CM | POA: Diagnosis present

## 2014-07-27 DIAGNOSIS — R5381 Other malaise: Secondary | ICD-10-CM

## 2014-07-27 DIAGNOSIS — E669 Obesity, unspecified: Secondary | ICD-10-CM | POA: Diagnosis present

## 2014-07-27 DIAGNOSIS — E876 Hypokalemia: Secondary | ICD-10-CM | POA: Diagnosis not present

## 2014-07-27 DIAGNOSIS — Z6825 Body mass index (BMI) 25.0-25.9, adult: Secondary | ICD-10-CM | POA: Diagnosis not present

## 2014-07-27 DIAGNOSIS — Z923 Personal history of irradiation: Secondary | ICD-10-CM | POA: Diagnosis not present

## 2014-07-27 DIAGNOSIS — R6251 Failure to thrive (child): Secondary | ICD-10-CM

## 2014-07-27 DIAGNOSIS — Z7901 Long term (current) use of anticoagulants: Secondary | ICD-10-CM

## 2014-07-27 DIAGNOSIS — R109 Unspecified abdominal pain: Secondary | ICD-10-CM

## 2014-07-27 DIAGNOSIS — T50995A Adverse effect of other drugs, medicaments and biological substances, initial encounter: Secondary | ICD-10-CM | POA: Diagnosis present

## 2014-07-27 DIAGNOSIS — C801 Malignant (primary) neoplasm, unspecified: Secondary | ICD-10-CM

## 2014-07-27 DIAGNOSIS — K560 Paralytic ileus: Secondary | ICD-10-CM | POA: Diagnosis present

## 2014-07-27 DIAGNOSIS — Z833 Family history of diabetes mellitus: Secondary | ICD-10-CM | POA: Diagnosis not present

## 2014-07-27 DIAGNOSIS — Z931 Gastrostomy status: Secondary | ICD-10-CM

## 2014-07-27 DIAGNOSIS — I739 Peripheral vascular disease, unspecified: Secondary | ICD-10-CM | POA: Diagnosis present

## 2014-07-27 DIAGNOSIS — C786 Secondary malignant neoplasm of retroperitoneum and peritoneum: Secondary | ICD-10-CM

## 2014-07-27 DIAGNOSIS — I82409 Acute embolism and thrombosis of unspecified deep veins of unspecified lower extremity: Secondary | ICD-10-CM

## 2014-07-27 DIAGNOSIS — D638 Anemia in other chronic diseases classified elsewhere: Secondary | ICD-10-CM | POA: Diagnosis present

## 2014-07-27 DIAGNOSIS — R5383 Other fatigue: Secondary | ICD-10-CM

## 2014-07-27 DIAGNOSIS — D72829 Elevated white blood cell count, unspecified: Secondary | ICD-10-CM | POA: Diagnosis present

## 2014-07-27 LAB — COMPREHENSIVE METABOLIC PANEL
ALT: 29 U/L (ref 0–35)
AST: 32 U/L (ref 0–37)
Albumin: 3.8 g/dL (ref 3.5–5.2)
Alkaline Phosphatase: 103 U/L (ref 39–117)
BILIRUBIN TOTAL: 0.5 mg/dL (ref 0.3–1.2)
BUN: 50 mg/dL — ABNORMAL HIGH (ref 6–23)
CO2: 38 meq/L — AB (ref 19–32)
Calcium: 10.1 mg/dL (ref 8.4–10.5)
Chloride: <65 mEq/L — CL (ref 96–112)
Creatinine, Ser: 4.62 mg/dL — ABNORMAL HIGH (ref 0.50–1.10)
GFR calc Af Amer: 11 mL/min — ABNORMAL LOW (ref >90)
GFR, EST NON AFRICAN AMERICAN: 9 mL/min — AB (ref >90)
Glucose, Bld: 126 mg/dL — ABNORMAL HIGH (ref 70–99)
Potassium: 4.3 mEq/L (ref 3.7–5.3)
SODIUM: 107 meq/L — AB (ref 137–147)
Total Protein: 9 g/dL — ABNORMAL HIGH (ref 6.0–8.3)

## 2014-07-27 LAB — CBC WITH DIFFERENTIAL/PLATELET
BASOS PCT: 0 % (ref 0–1)
Basophils Absolute: 0 10*3/uL (ref 0.0–0.1)
EOS ABS: 0 10*3/uL (ref 0.0–0.7)
EOS PCT: 0 % (ref 0–5)
HEMATOCRIT: 31.2 % — AB (ref 36.0–46.0)
HEMOGLOBIN: 11.1 g/dL — AB (ref 12.0–15.0)
Lymphocytes Relative: 6 % — ABNORMAL LOW (ref 12–46)
Lymphs Abs: 0.4 10*3/uL — ABNORMAL LOW (ref 0.7–4.0)
MCH: 26.7 pg (ref 26.0–34.0)
MCHC: 35.6 g/dL (ref 30.0–36.0)
MCV: 75.2 fL — AB (ref 78.0–100.0)
MONO ABS: 0.7 10*3/uL (ref 0.1–1.0)
MONOS PCT: 9 % (ref 3–12)
Neutro Abs: 6.4 10*3/uL (ref 1.7–7.7)
Neutrophils Relative %: 85 % — ABNORMAL HIGH (ref 43–77)
Platelets: 467 10*3/uL — ABNORMAL HIGH (ref 150–400)
RBC: 4.15 MIL/uL (ref 3.87–5.11)
RDW: 15 % (ref 11.5–15.5)
WBC: 7.5 10*3/uL (ref 4.0–10.5)

## 2014-07-27 LAB — APTT: aPTT: 28 seconds (ref 24–37)

## 2014-07-27 LAB — HEPARIN ANTI-XA: Heparin LMW: 0.77 IU/mL

## 2014-07-27 LAB — PHOSPHORUS: Phosphorus: 3.4 mg/dL (ref 2.3–4.6)

## 2014-07-27 LAB — MAGNESIUM: Magnesium: 1.8 mg/dL (ref 1.5–2.5)

## 2014-07-27 LAB — PROTIME-INR
INR: 1.19 (ref 0.00–1.49)
Prothrombin Time: 15.1 seconds (ref 11.6–15.2)

## 2014-07-27 LAB — TSH: TSH: 1.78 u[IU]/mL (ref 0.350–4.500)

## 2014-07-27 MED ORDER — ACETAMINOPHEN 650 MG RE SUPP: 650.0000 mg | Freq: Four times a day (QID) | RECTAL | Status: DC | PRN

## 2014-07-27 MED ORDER — CYCLOSPORINE 0.05 % OP EMUL
1.0000 [drp] | Freq: Two times a day (BID) | OPHTHALMIC | Status: DC
  Administered 2014-07-28 – 2014-08-13 (×29): 1 [drp] via OPHTHALMIC
  Filled 2014-07-27 (×40): qty 1

## 2014-07-27 MED ORDER — MORPHINE SULFATE 2 MG/ML IJ SOLN
1.0000 mg | INTRAMUSCULAR | Status: DC | PRN
  Administered 2014-07-29 – 2014-07-30 (×4): 1 mg via INTRAVENOUS
  Filled 2014-07-27 (×4): qty 1

## 2014-07-27 MED ORDER — MAGNESIUM HYDROXIDE 400 MG/5ML PO SUSP: 30.0000 mL | Freq: Every day | ORAL | Status: DC | PRN

## 2014-07-27 MED ORDER — PIPERACILLIN-TAZOBACTAM IN DEX 2-0.25 GM/50ML IV SOLN
2.2500 g | Freq: Three times a day (TID) | INTRAVENOUS | Status: DC
  Administered 2014-07-27 – 2014-07-31 (×11): 2.25 g via INTRAVENOUS
  Filled 2014-07-27 (×13): qty 50

## 2014-07-27 MED ORDER — ONDANSETRON HCL 4 MG PO TABS: 4.0000 mg | ORAL_TABLET | Freq: Four times a day (QID) | ORAL | Status: DC | PRN

## 2014-07-27 MED ORDER — BOOST PLUS PO LIQD
237.0000 mL | Freq: Three times a day (TID) | ORAL | Status: DC
  Administered 2014-07-28 – 2014-08-03 (×5): 237 mL via ORAL
  Filled 2014-07-27 (×21): qty 237

## 2014-07-27 MED ORDER — IPRATROPIUM-ALBUTEROL 0.5-2.5 (3) MG/3ML IN SOLN: 3.0000 mL | Freq: Four times a day (QID) | RESPIRATORY_TRACT | Status: DC | PRN

## 2014-07-27 MED ORDER — ONDANSETRON HCL 4 MG/2ML IJ SOLN
4.0000 mg | Freq: Four times a day (QID) | INTRAMUSCULAR | Status: DC | PRN
  Administered 2014-07-27 – 2014-07-28 (×2): 4 mg via INTRAVENOUS
  Filled 2014-07-27 (×2): qty 2

## 2014-07-27 MED ORDER — ACETAMINOPHEN 325 MG PO TABS
650.0000 mg | ORAL_TABLET | Freq: Four times a day (QID) | ORAL | Status: DC | PRN
  Administered 2014-07-28: 650 mg via ORAL
  Filled 2014-07-27: qty 2

## 2014-07-27 MED ORDER — HYDROCODONE-ACETAMINOPHEN 5-325 MG PO TABS: 1.0000 | ORAL_TABLET | ORAL | Status: DC | PRN

## 2014-07-27 MED ORDER — ENOXAPARIN SODIUM 100 MG/ML ~~LOC~~ SOLN
100.0000 mg | SUBCUTANEOUS | Status: DC
Start: 2014-07-27 — End: 2014-07-27
  Filled 2014-07-27: qty 1

## 2014-07-27 MED ORDER — SODIUM CHLORIDE 0.9 % IV SOLN
INTRAVENOUS | Status: AC
  Administered 2014-07-27 – 2014-07-28 (×2): via INTRAVENOUS

## 2014-07-27 MED ORDER — OXYCODONE-ACETAMINOPHEN 5-325 MG PO TABS: 1.0000 | ORAL_TABLET | ORAL | Status: DC | PRN

## 2014-07-27 MED ORDER — ENOXAPARIN SODIUM 80 MG/0.8ML ~~LOC~~ SOLN
70.0000 mg | SUBCUTANEOUS | Status: DC
  Administered 2014-07-27 – 2014-07-30 (×4): 70 mg via SUBCUTANEOUS
  Filled 2014-07-27 (×5): qty 0.8

## 2014-07-27 MED ORDER — IPRATROPIUM-ALBUTEROL 20-100 MCG/ACT IN AERS: 1.0000 | INHALATION_SPRAY | Freq: Four times a day (QID) | RESPIRATORY_TRACT | Status: DC | PRN

## 2014-07-27 NOTE — Progress Notes (Signed)
Patient ID: Victoria [name], female   DOB: [birth year], 63 y.o.   MRN: [id number] Pt from [facility] needs admission for failure to thrive, dehydraiton; h/o rectal cancer now new liver mets, possible acute cholecystitis.  Leisa Lenz [phone]

## 2014-07-27 NOTE — Progress Notes (Signed)
Report given to Junior RN. Pt transferred to 1402 for cardiac monitoring. Noreene Larsson RN, BSN

## 2014-07-27 NOTE — Progress Notes (Signed)
CRITICAL VALUE ALERT  Critical value received:  Sodium level 107, Chloride < 65  Date of notification:  07/27/14  Time of notification:  1938  Critical value read back:Yes.    Nurse who received alert:  Noreene Larsson RN, BSN  MD notified (1st page):  Rogue Bussing NP  Time of first page: [phone]  Responding MD:  Rogue Bussing NP  Time MD responded:  1950 Orders received to increase IV fluid rate and transfer to telemetry

## 2014-07-27 NOTE — Progress Notes (Signed)
ANTIBIOTIC CONSULT NOTE - INITIAL  Pharmacy Consult for zosyn Indication: cholecystitis  Allergies  Allergen Reactions  . Ointment Base [Lanolin-Petrolatum] Rash    Specifically burn ointments.    Patient Measurements: Height: 5\' 5"  (165.1 cm) Weight: 146 lb 1.6 oz (66.271 kg) IBW/kg (Calculated) : 57 Adjusted Body Weight:   Vital Signs: Temp: 97.3 F (36.3 C) (08/28 1739) Temp src: Oral (08/28 1739) BP: 171/95 mmHg (08/28 1739) Pulse Rate: 95 (08/28 1739) Intake/Output from previous day:   Intake/Output from this shift:    Labs:  Recent Labs  07/27/14 1849  WBC 7.5  HGB 11.1*  PLT 467*  CREATININE 4.62*   Estimated Creatinine Clearance: 11.4 ml/min (by C-G formula based on Cr of 4.62). No results found for this basename: VANCOTROUGH, Corlis Leak, VANCORANDOM, [place], GENTPEAK, GENTRANDOM, TOBRATROUGH, TOBRAPEAK, TOBRARND, AMIKACINPEAK, AMIKACINTROU, AMIKACIN,  in the last 72 hours   Microbiology: Recent Results (from the past 720 hour(s))  URINE CULTURE     Status: None   Collection Time    07/11/14  5:39 PM      Result Value Ref Range Status   Specimen Description URINE, CLEAN CATCH   Final   Special Requests NONE   Final   Culture  Setup Time     Final   Value: 07/11/2014 22:20     Performed at [place]     Final   Value: 50,000 COLONIES/ML     Performed at [facility]   Culture     Final   Value: Multiple bacterial morphotypes present, none predominant. Suggest appropriate recollection if clinically indicated.     Performed at [facility]   Report Status 07/12/2014 FINAL   Final  CULTURE, BLOOD (ROUTINE X 2)     Status: None   Collection Time    07/11/14  9:22 PM      Result Value Ref Range Status   Specimen Description BLOOD RIGHT ARM   Final   Special Requests BOTTLES DRAWN AEROBIC AND ANAEROBIC 5CC   Final   Culture  Setup Time     Final   Value: 07/12/2014 00:30     Performed at [facility]   Culture     Final   Value: NO GROWTH 5 DAYS     Performed at [facility]   Report Status 07/18/2014 FINAL   Final  CULTURE, BLOOD (ROUTINE X 2)     Status: None   Collection Time    07/11/14  9:22 PM      Result Value Ref Range Status   Specimen Description BLOOD RIGHT HAND   Final   Special Requests BOTTLES DRAWN AEROBIC AND ANAEROBIC 5CC   Final   Culture  Setup Time     Final   Value: 07/12/2014 00:30     Performed at [facility]   Culture     Final   Value: NO GROWTH 5 DAYS     Performed at [facility]   Report Status 07/18/2014 FINAL   Final   Assessment: 18 YOF history of rectal cancer admitted from [facility] with failure to thrive, dehydration and possible cholecystitis (or gallbladder wall tumor).  Note to have ARI at time of admission. Orders to start zosyn.  8/28 >> zosyn  >>  Tmax: WBCs: WNL Renal: ARF, CrCl < 70ml/min  No cultures   Goal of Therapy:  Dose for renal function and indication  Plan:   Zosyn 2.25gm IV  q8h for ARI  Monitor renal function and adjust accordingly  Doreene Eland, PharmD, BCPS.   Pager: [phone]  07/27/2014,8:33 PM

## 2014-07-27 NOTE — Progress Notes (Signed)
ANTICOAGULATION CONSULT NOTE - Initial Consult  Pharmacy Consult for Lovenox Indication: DVT  Allergies  Allergen Reactions  . Ointment Base [Lanolin-Petrolatum] Rash    Specifically burn ointments.    Patient Measurements: Height: 5\' 5"  (165.1 cm) Weight: 146 lb 1.6 oz (66.271 kg) IBW/kg (Calculated) : 57 Heparin Dosing Weight:   Vital Signs: Temp: 97.3 F (36.3 C) (08/28 1739) Temp src: Oral (08/28 1739) BP: 171/95 mmHg (08/28 1739) Pulse Rate: 95 (08/28 1739)  Labs:  Recent Labs  07/27/14 1849  HGB 11.1*  HCT 31.2*  PLT 467*  APTT 28  LABPROT 15.1  INR 1.19  CREATININE 4.62*    Estimated Creatinine Clearance: 11.4 ml/min (by C-G formula based on Cr of 4.62).   Medical History: Past Medical History  Diagnosis Date  . Medical history non-contributory   . HDL deficiency   . Allergy     seasonal allergic rhinitis  . History of radiation therapy 10/16/13-11/27/13    rectal 50.4Gy  . Peripheral vascular disease 4/15    DVT LEFT LEG  . Cancer 09/19/13    rectum    Medications:  Scheduled:  . cycloSPORINE  1 drop Both Eyes BID  . lactose free nutrition  237 mL Oral TID BM   Infusions:  . sodium chloride 100 mL/hr at 07/27/14 1946   PRN: acetaminophen, acetaminophen, HYDROcodone-acetaminophen, ipratropium-albuterol, magnesium hydroxide, morphine injection, ondansetron (ZOFRAN) IV, ondansetron  Assessment: Pt with h/o rectal cancer and now with liver mets was admitted from cancer center for failure to thrive and dehydration. Pt was on Lovenox 100mg  SQ Q24h PTA for L leg DVT diagnosed  in 03/15/14 with last dose 8/27@1730 . Scr increased to 4.62 today. Lovenox to continue while in hospital.  Goal of Therapy:  Anti-Xa level 0.6-1 units/ml 4hrs after LMWH dose given Monitor PLT with anticoagulation protocol: Yes   Plan:  . Will D/C Lovenox 100mg  SQ Q24h . Will check anti-Xa level now and will resume Lovenox at 65 mg Sq Q24h when appropriate.  Garnet Sierras 07/27/2014,8:17 PM

## 2014-07-27 NOTE — Progress Notes (Addendum)
[place] OFFICE PROGRESS NOTE   Diagnosis: Rectal cancer.   INTERVAL HISTORY:   Victoria [name] returns as scheduled. She has had 2 episodes of "trembling" involving her upper body over the past 2 days. Each episode lasted 8-10 minutes. Her husband had to hold her during these occurrences. Victoria [name] denies any fever. They do not have a thermometer at home. In retrospect her husband thinks she may have been having shaking chills. She has had 2 recent episodes of nausea/vomiting. She denies abdominal pain. She has been constipated. She utilized a fleets enema yesterday with good results. Her husband notes that she is "belching" frequently. No urinary symptoms. She has an occasional cough. She denies shortness of breath. Her husband feels her appetite is beginning to improve.  Objective:  Vital signs in last 24 hours:  Blood pressure 142/95, pulse 100, resp. rate 19, height 5' 5"  (1.651 m), weight 146 lb 12.8 oz (66.588 kg).   General: She does not appear well. HEENT: No thrush or ulcers. Resp: Lungs are clear. Breath sounds are diminished at the right base. Respiratory rate is increased. Cardio: Regular rate and rhythm. GI: Abdomen is distended. Bowel sounds active. Nontender. No hepatomegaly. Vascular: Left lower leg is slightly larger than the right lower leg. Neuro: She is alert and oriented.     Lab Results:  Lab Results  Component Value Date   WBC 8.4 07/11/2014   HGB 10.7* 07/11/2014   HCT 31.9* 07/11/2014   MCV 81.6 07/11/2014   PLT 322 07/11/2014   NEUTROABS 7.1 07/11/2014    Imaging:  No results found.  Medications: I have reviewed the patient's current medications.  Assessment/Plan: 1. 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  Initiation of radiation and concurrent Xeloda 10/16/2013.  Low anterior resection and diverting ileostomy 01/12/2014, stage IIIB-ypT4,ypN1 tumor with negative surgical margins  No loss of expression of  mismatch repair proteins.  Cycle 1 adjuvant Xeloda 02/05/2014.  Cycle 2 adjuvant Xeloda 02/26/2014.  Cycle 3 adjuvant Xeloda 03/18/2014.  Cycle 4 adjuvant Xeloda 04/08/2014.  2. Indeterminate right lung nodule on CT 09/29/2013. 3. History of weight loss. She has gained weight. 4. Skin irritation around the ostomy 02/08/2014. Question secondary to leakage of stool, question adhesive reaction, question tape injury. She met with an ostomy nurse. Improved. 5. Left leg DVT 03/15/2014. She continues Lovenox. 6. Admission for ileostomy reversal 06/07/2014. 7. Hospitalization 07/11/2014 through 07/14/2014 with a small bowel obstruction. Patient felt to most likely have a narrow area at her small bowel anastomosis due to inflammation after surgery and not a true obstruction. 8. Elevated CEA 07/14/2014. 9. CT chest/abdomen/pelvis 07/18/2014 with a new small right pleural effusion; stable 5 mm posterior right upper lobe lung nodule; gallstones noted within the gallbladder; marked gallbladder wall thickening measuring up to 11 mm in thickness at the fundus; abnormal low density noted in the adjacent inferior liver with small scattered satellite nodules in the adjacent liver. Findings within the liver new since the prior study. Small amount of perihepatic fluid adjacent to the right hepatic lobe and gallbladder. Extensive edema/stranding within the pelvis extending from the presacral space anteriorly. New area of sclerosis right pubic bone.   Disposition: Victoria [name] does not appear well at today's visit. We are concerned for an infection and feel she needs to be hospitalized for initiation of antibiotics/further workup. Dr. Benay Spice has spoken with Dr. Leighton Ruff as well as the hospitalist. She will be directly admitted to [facility].  Ned Card ANP/GNP-BC   07/27/2014  3:43 PM  This was a shared visit with Ned Card.  Victoria [name] appears ill. Her symptoms may be related to  metastatic rectal cancer versus an infection. She reports a right nurse over the past few days. I am concerned she has acute cholecystitis or another intra-abdominal infection. I discussed the case with Dr. Marcello Moores. Ms. Kueker will be admitted for supportive care and further diagnostic evaluation.  Her case was presented at the GI tumor conference on 07/25/2014. The consensus recommendation was to proceed with a diagnostic biopsy of the apparent tumor involving the liver near the gallbladder fossa. A biopsy will be placed on hold until she is further evaluated for an infection.  Julieanne Manson, M.D.

## 2014-07-27 NOTE — Progress Notes (Signed)
ANTICOAGULATION CONSULT NOTE - Brief Note  Pharmacy Consult for Lovenox  Indication: DVT   Pt with h/o rectal cancer and now with liver mets was admitted from cancer center for failure to thrive and dehydration. Pt was on Lovenox 100mg  SQ Q24h PTA for L leg DVT diagnosed in 03/15/14 with last dose 8/27@1730 . Scr increased to 4.62 today. Lovenox to continue while in hospital.  8/28 20:30 LMW anti-Xa = 0.77 (therapeutic range 0.6-1)  Last dose was 17:30 8/27  A/P:  Based on anti-Xa level and current CrCl <72ml/min, lovenox 70mg  SQ q24h  Follow renal function and increase to 1.5mg /kg q24h when CrCL > 66ml/min  Check CBC q72h while in hospital on [place], PharmD, BCPS.   Pager: [phone] 07/27/2014 9:15 PM

## 2014-07-27 NOTE — H&P (Addendum)
[facility] History and Physical  Victoria [name] YQI:[id number] DOB: Nov 25, 1951 DOA: 07/27/2014  Referring physician: ER physician PCP: Irven Shelling, MD   Chief Complaint: failure to thrive  HPI:  63 year old female with history of rectal adenocarcinoma, status post radiation and chemotherapy, status post resection and diverting ileostomy in 12/2013, ileostomy reversal on 06/07/2014, history of DVT on anticoagulation with Lovenox, recent hospitalization for small bowel obstruction 07/11/14 through 07/14/2014 who presented from cancer center with trembling for past few days, just not feeling well, frequent belching, nausea and bilious vomiting. She had recent CT scan done which among other things was significant for gallbladder wall thickening up to 11 mm in thickness. Dr. Benay Spice was concern that the patient may have developed acute cholecystitis for which reason disrect admission was requested.  Assessment & Plan    Principal Problem: Rectal adenocarcinoma with liver metastases  Recent CT abdomen done 07/18/2014 with findings of new small right pleural effusion; stable 5 mm posterior right upper lobe lung nodule; gallstones noted within the gallbladder; marked gallbladder wall thickening measuring up to 11 mm in thickness at the fundus; abnormal low density noted in the adjacent inferior liver with small scattered satellite nodules in the adjacent liver. Findings within the liver new since the prior study. Small amount of perihepatic fluid adjacent to the right hepatic lobe and gallbladder. Extensive edema/stranding within the pelvis extending from the presacral space anteriorly. New area of sclerosis right pubic bone.  We will obtain all admission labs, CMET, CBC, magnesium, phosphorous. Also obtain abdominal US  Supportive care with IV fluids, antiemetics as needed, analgesia as needed.  Started empiric zosyn for possible acute cholecystitis.  Active Problems: History  of DVT, LLE 02/2014  Restarted Lovenox subQ. Improved. Severe protein calorie malnutrition  Nutrition consulted   DVT prophylaxis  On therapeutic anticoagulation with Lovenox  Radiological Exams on Admission: No results found.   Code Status: Full Family Communication: Plan of care discussed with the patient  Disposition Plan: Admit for further evaluation  Leisa Lenz, MD  [facility] Pager [phone]  Review of Systems:  Constitutional: Negative for fever, chills and positive for malaise/fatigue. Negative for diaphoresis.  HENT: Negative for hearing loss, ear pain, nosebleeds, congestion, sore throat, neck pain, tinnitus and ear discharge.   Eyes: Negative for blurred vision, double vision, photophobia, pain, discharge and redness.  Respiratory: Negative for cough, hemoptysis, sputum production, shortness of breath, wheezing and stridor.   Cardiovascular: Negative for chest pain, palpitations, orthopnea, claudication and leg swelling.  Gastrointestinal: positive for nausea and vomiting, abdominal pain and discomfort. Negative for heartburn, blood in stool and melena.  Genitourinary: Negative for dysuria, urgency, frequency, hematuria and flank pain.  Musculoskeletal: Negative for myalgias, back pain, joint pain and falls.  Skin: Negative for itching and rash.  Neurological: positive for weakness. Negative for tremors, sensory change, speech change, focal weakness, loss of consciousness and headaches.  Endo/Heme/Allergies: Negative for environmental allergies and polydipsia. Does not bruise/bleed easily.  Psychiatric/Behavioral: Negative for suicidal ideas. The patient is not nervous/anxious.      Past Medical History  Diagnosis Date  . Medical history non-contributory   . HDL deficiency   . Allergy     seasonal allergic rhinitis  . History of radiation therapy 10/16/13-11/27/13    rectal 50.4Gy  . Peripheral vascular disease 4/15    DVT LEFT LEG  . Cancer 09/19/13     rectum   Past Surgical History  Procedure Laterality Date  . Left leg surgery  1986    rod from hip to knee  . Colonoscopy with propofol N/A 09/19/2013    Procedure: COLONOSCOPY WITH PROPOFOL;  Surgeon: Garlan Fair, MD;  Location: [facility];  Service: Endoscopy;  Laterality: N/A;  . Femur fracture surgery Left 1985    steel rod  . Eus N/A 09/27/2013    Procedure: LOWER ENDOSCOPIC ULTRASOUND (EUS);  Surgeon: Arta Silence, MD;  Location: [facility];  Service: Endoscopy;  Laterality: N/A;  . Laparoscopic low anterior resection N/A 01/12/2014    Procedure: LAPAROSCOPIC LOW ANTERIOR RESECTION ;  Surgeon: Leighton Ruff, MD;  Location: [facility];  Service: General;  Laterality: N/A;  . Ileo loop diversion N/A 01/12/2014    Procedure:   LOOP ILEOSTOMY;  Surgeon: Leighton Ruff, MD;  Location: [facility];  Service: General;  Laterality: N/A;  . Ileostomy closure N/A 06/07/2014    Procedure: ILEOSTOMY reversal ;  Surgeon: Leighton Ruff, MD;  Location: [facility];  Service: General;  Laterality: N/A;   Social History:  reports that she has never smoked. She has never used smokeless tobacco. She reports that she drinks alcohol. She reports that she does not use illicit drugs.  Allergies  Allergen Reactions  . Ointment Base [Lanolin-Petrolatum] Rash    Specifically burn ointments.    Family History:  Family History  Problem Relation Age of Onset  . Hypertension Mother   . Diabetes Father   . Heart disease Father     CAD     Prior to Admission medications   Medication Sig Start Date End Date Taking? Authorizing Provider  cycloSPORINE (RESTASIS) 0.05 % ophthalmic emulsion Place 1 drop into both eyes 2 (two) times daily.    Historical Provider, MD  enoxaparin (LOVENOX) 100 MG/ML injection Inject 100 mg into the skin daily.    Historical Provider, MD  Ipratropium-Albuterol (COMBIVENT) 20-100 MCG/ACT AERS respimat Inhale 1 puff into the lungs every 6 (six) hours as needed for wheezing. 07/14/14    Barton Dubois, MD  lactose free nutrition (BOOST PLUS) LIQD Take 237 mLs by mouth 3 (three) times daily between meals. 07/14/14   Barton Dubois, MD  magnesium hydroxide (MILK OF MAGNESIA) 400 MG/5ML suspension Take 30 mLs by mouth daily as needed for mild constipation.    Historical Provider, MD  oxyCODONE-acetaminophen (PERCOCET/ROXICET) 5-325 MG per tablet Take 1 tablet by mouth every 4 (four) hours as needed for moderate pain. 2/95/62   Leighton Ruff, MD  potassium chloride SA (K-DUR,KLOR-CON) 20 MEQ tablet Take 2 tablets (40 mEq total) by mouth daily. 07/14/14   Barton Dubois, MD  psyllium (METAMUCIL) 58.6 % powder Take 1 packet by mouth 2 (two) times daily. For constipation 07/14/14   Barton Dubois, MD   Physical Exam: There were no vitals filed for this visit.  Physical Exam  Constitutional: Appears ill, no distress HENT: Normocephalic. No tonsillar erythema or exudates Eyes: Conjunctivae and EOM are normal. PERRLA, no scleral icterus.  Neck: Normal ROM. Neck supple. No JVD. No tracheal deviation.  CVS: RRR, S1/S2 +, no murmurs, no gallops, no carotid bruit.  Pulmonary: Effort and breath sounds normal, no stridor, rhonchi, wheezes, rales.  Abdominal: Soft. BS +,  distended, tenderness to palpation over right upper quadrant, no rebound or guarding.  Musculoskeletal: Normal range of motion. No edema and no tenderness.  Lymphadenopathy: No lymphadenopathy noted, cervical, inguinal. Neuro: Alert. Normal reflexes, muscle tone coordination. No focal neurologic deficits. Skin: Skin is warm and dry.  Psychiatric: Normal mood and affect. Behavior, judgment, thought content  normal.   Labs on Admission:  Basic Metabolic Panel: No results found for this basename: NA, K, CL, CO2, GLUCOSE, BUN, CREATININE, CALCIUM, MG, PHOS,  in the last 168 hours Liver Function Tests: No results found for this basename: AST, ALT, ALKPHOS, BILITOT, PROT, ALBUMIN,  in the last 168 hours No results found for this  basename: LIPASE, AMYLASE,  in the last 168 hours No results found for this basename: AMMONIA,  in the last 168 hours CBC: No results found for this basename: WBC, NEUTROABS, HGB, HCT, MCV, PLT,  in the last 168 hours Cardiac Enzymes: No results found for this basename: CKTOTAL, CKMB, CKMBINDEX, TROPONINI,  in the last 168 hours BNP: No components found with this basename: POCBNP,  CBG: No results found for this basename: GLUCAP,  in the last 168 hours  If 7PM-7AM, please contact night-coverage [email or web address] Password [facility] 07/27/2014, 5:57 PM

## 2014-07-28 ENCOUNTER — Inpatient Hospital Stay (HOSPITAL_COMMUNITY): Payer: BC Managed Care – PPO

## 2014-07-28 DIAGNOSIS — K819 Cholecystitis, unspecified: Secondary | ICD-10-CM

## 2014-07-28 DIAGNOSIS — R627 Adult failure to thrive: Secondary | ICD-10-CM

## 2014-07-28 DIAGNOSIS — N179 Acute kidney failure, unspecified: Secondary | ICD-10-CM

## 2014-07-28 DIAGNOSIS — K56609 Unspecified intestinal obstruction, unspecified as to partial versus complete obstruction: Secondary | ICD-10-CM

## 2014-07-28 DIAGNOSIS — I82409 Acute embolism and thrombosis of unspecified deep veins of unspecified lower extremity: Secondary | ICD-10-CM

## 2014-07-28 DIAGNOSIS — J90 Pleural effusion, not elsewhere classified: Secondary | ICD-10-CM

## 2014-07-28 DIAGNOSIS — R970 Elevated carcinoembryonic antigen [CEA]: Secondary | ICD-10-CM

## 2014-07-28 DIAGNOSIS — R918 Other nonspecific abnormal finding of lung field: Secondary | ICD-10-CM

## 2014-07-28 DIAGNOSIS — C20 Malignant neoplasm of rectum: Secondary | ICD-10-CM

## 2014-07-28 DIAGNOSIS — K810 Acute cholecystitis: Secondary | ICD-10-CM

## 2014-07-28 DIAGNOSIS — K769 Liver disease, unspecified: Secondary | ICD-10-CM

## 2014-07-28 DIAGNOSIS — D638 Anemia in other chronic diseases classified elsewhere: Secondary | ICD-10-CM | POA: Diagnosis present

## 2014-07-28 DIAGNOSIS — E871 Hypo-osmolality and hyponatremia: Secondary | ICD-10-CM | POA: Diagnosis present

## 2014-07-28 LAB — BASIC METABOLIC PANEL
BUN: 53 mg/dL — ABNORMAL HIGH (ref 6–23)
BUN: 53 mg/dL — ABNORMAL HIGH (ref 6–23)
BUN: 50 mg/dL — AB (ref 6–23)
BUN: 51 mg/dL — ABNORMAL HIGH (ref 6–23)
CALCIUM: 8.9 mg/dL (ref 8.4–10.5)
CO2: 37 mEq/L — ABNORMAL HIGH (ref 19–32)
CO2: 38 meq/L — AB (ref 19–32)
CO2: 40 meq/L — AB (ref 19–32)
CO2: 40 meq/L — AB (ref 19–32)
CREATININE: 5.03 mg/dL — AB (ref 0.50–1.10)
CREATININE: 4.54 mg/dL — AB (ref 0.50–1.10)
CREATININE: 4.32 mg/dL — AB (ref 0.50–1.10)
Calcium: 9.5 mg/dL (ref 8.4–10.5)
Calcium: 9.7 mg/dL (ref 8.4–10.5)
Calcium: 9.2 mg/dL (ref 8.4–10.5)
Chloride: <65 mEq/L — CL (ref 96–112)
Creatinine, Ser: 4.96 mg/dL — ABNORMAL HIGH (ref 0.50–1.10)
GFR calc Af Amer: 10 mL/min — ABNORMAL LOW (ref >90)
GFR calc Af Amer: 11 mL/min — ABNORMAL LOW (ref >90)
GFR calc Af Amer: 12 mL/min — ABNORMAL LOW (ref >90)
GFR calc non Af Amer: 8 mL/min — ABNORMAL LOW (ref >90)
GFR calc non Af Amer: 9 mL/min — ABNORMAL LOW (ref >90)
GFR calc non Af Amer: 9 mL/min — ABNORMAL LOW (ref >90)
GFR, EST AFRICAN AMERICAN: 10 mL/min — AB (ref >90)
GFR, EST NON AFRICAN AMERICAN: 10 mL/min — AB (ref >90)
Glucose, Bld: 139 mg/dL — ABNORMAL HIGH (ref 70–99)
Glucose, Bld: 121 mg/dL — ABNORMAL HIGH (ref 70–99)
Glucose, Bld: 118 mg/dL — ABNORMAL HIGH (ref 70–99)
Glucose, Bld: 114 mg/dL — ABNORMAL HIGH (ref 70–99)
POTASSIUM: 4.9 meq/L (ref 3.7–5.3)
Potassium: 4.2 mEq/L (ref 3.7–5.3)
Potassium: 3.6 mEq/L — ABNORMAL LOW (ref 3.7–5.3)
Potassium: 3.3 mEq/L — ABNORMAL LOW (ref 3.7–5.3)
SODIUM: 115 meq/L — AB (ref 137–147)
Sodium: 113 mEq/L — CL (ref 137–147)
Sodium: 111 mEq/L — CL (ref 137–147)
Sodium: 113 mEq/L — CL (ref 137–147)

## 2014-07-28 LAB — URINE MICROSCOPIC-ADD ON

## 2014-07-28 LAB — COMPREHENSIVE METABOLIC PANEL
ALT: 30 U/L (ref 0–35)
AST: 36 U/L (ref 0–37)
Albumin: 3.4 g/dL — ABNORMAL LOW (ref 3.5–5.2)
Alkaline Phosphatase: 94 U/L (ref 39–117)
BILIRUBIN TOTAL: 0.6 mg/dL (ref 0.3–1.2)
BUN: 53 mg/dL — AB (ref 6–23)
CALCIUM: 9.2 mg/dL (ref 8.4–10.5)
CO2: 40 meq/L — AB (ref 19–32)
CREATININE: 5.12 mg/dL — AB (ref 0.50–1.10)
Chloride: <65 mEq/L — CL (ref 96–112)
GFR calc Af Amer: 10 mL/min — ABNORMAL LOW (ref >90)
GFR, EST NON AFRICAN AMERICAN: 8 mL/min — AB (ref >90)
Glucose, Bld: 118 mg/dL — ABNORMAL HIGH (ref 70–99)
Potassium: 4.8 mEq/L (ref 3.7–5.3)
Sodium: 110 mEq/L — CL (ref 137–147)
Total Protein: 8.1 g/dL (ref 6.0–8.3)

## 2014-07-28 LAB — URINALYSIS, ROUTINE W REFLEX MICROSCOPIC
BILIRUBIN URINE: NEGATIVE
Glucose, UA: NEGATIVE mg/dL
KETONES UR: NEGATIVE mg/dL
LEUKOCYTES UA: NEGATIVE
Nitrite: NEGATIVE
PROTEIN: 30 mg/dL — AB
Specific Gravity, Urine: 1.016 (ref 1.005–1.030)
Urobilinogen, UA: 0.2 mg/dL (ref 0.0–1.0)
pH: 5.5 (ref 5.0–8.0)

## 2014-07-28 LAB — GLUCOSE, CAPILLARY: GLUCOSE-CAPILLARY: 132 mg/dL — AB (ref 70–99)

## 2014-07-28 LAB — CBC
HCT: 29.8 % — ABNORMAL LOW (ref 36.0–46.0)
HEMOGLOBIN: 10.6 g/dL — AB (ref 12.0–15.0)
MCH: 26.7 pg (ref 26.0–34.0)
MCHC: 35.6 g/dL (ref 30.0–36.0)
MCV: 75.1 fL — AB (ref 78.0–100.0)
Platelets: 411 10*3/uL — ABNORMAL HIGH (ref 150–400)
RBC: 3.97 MIL/uL (ref 3.87–5.11)
RDW: 14.8 % (ref 11.5–15.5)
WBC: 7.5 10*3/uL (ref 4.0–10.5)

## 2014-07-28 MED ORDER — SODIUM CHLORIDE 0.9 % IV SOLN
INTRAVENOUS | Status: AC
  Administered 2014-07-28 (×2): via INTRAVENOUS

## 2014-07-28 MED ORDER — ONDANSETRON 8 MG/NS 50 ML IVPB
8.0000 mg | Freq: Four times a day (QID) | INTRAVENOUS | Status: DC | PRN
  Administered 2014-07-28 – 2014-07-29 (×2): 8 mg via INTRAVENOUS
  Filled 2014-07-28 (×3): qty 8

## 2014-07-28 MED ORDER — SODIUM CHLORIDE 0.9 % IV BOLUS (SEPSIS)
1000.0000 mL | Freq: Once | INTRAVENOUS | Status: AC
  Administered 2014-07-28: 1000 mL via INTRAVENOUS

## 2014-07-28 MED ORDER — PROMETHAZINE HCL 25 MG/ML IJ SOLN
25.0000 mg | Freq: Four times a day (QID) | INTRAMUSCULAR | Status: DC | PRN
  Administered 2014-07-28 – 2014-07-29 (×3): 25 mg via INTRAVENOUS
  Filled 2014-07-28 (×3): qty 1

## 2014-07-28 NOTE — Progress Notes (Signed)
CRITICAL VALUE ALERT  Critical value received:  Na 115, Cl <65  Date of notification:  07/28/14  Time of notification:  5027  Critical value read back:Yes.    Nurse who received alert:  C. Myers,RN  MD notified (1st page):  Dr. Jonnie Finner  Time of first page:  1845      Responding MD: Dr. Jonnie Finner (at bedside)  Time MD responded: [phone]

## 2014-07-28 NOTE — Progress Notes (Signed)
Bladder scan performed and resulted in 499 cc.  Foley placed per MD order. Stacey Drain

## 2014-07-28 NOTE — Progress Notes (Signed)
cCRITICAL VALUE ALERT  Critical value received: Na 110 Chloride <65 CO2 40  Date of notification:  07/28/14  Time of notification:  0710  Critical value read back:Yes.    Nurse who received alert:  J.Lennin Osmond  MD notified (1st page):  N/A  Time of first page:    MD notified (2nd page):  Time of second page:  Responding MD:  N/A  Time MD responded:  Lab values are about the same as the last one. Will continue to monitor

## 2014-07-28 NOTE — Progress Notes (Signed)
CRITICAL VALUE ALERT  Critical value received:  Na 111 Chloride <65 CO2 40  Date of notification:  07/28/14  Time of notification:  0035  Critical value read back:Yes.    Nurse who received alert:  J.Crosby Oriordan  MD notified (1st page):  T.Callahan  Time of first page:  0037  MD notified (2nd page):  Time of second page:  Responding MD:  T.Callahan  Time MD responded:  7169   No new orders. Will continue to monitor

## 2014-07-28 NOTE — Consult Note (Signed)
Renal Service Consult Note [name]  Victoria [name] 07/28/2014 [name] Requesting Physician:  Dr Charlies Silvers  Reason for Consult:  AKI and hyponatremia HPI: The patient is a 63 y.o. year-old with hx of rectal cancer, she had LAR and ileostomy in May and reversal of ileostomy one month ago. She came in 48 hours ago with severe N/V.  She has severe hyponatremia and creat of 5.0.  No hx of renal failure, no nsaid's, acei or IV contrast.  BP's are on the high side. She has severe hypochloremic metabolic alkalosis. She has completed her course of chemo (Xeloda) for the rectal cancer I believe.  Date    Creat  eGFR 2014   1.0   Feb 2015  0.8- 0.97 Mar 2015  1.4- 1.04 Jun 2014  1.1- 1.3 Jul 11, 2014  3.12 Jul 13, 2014  1.64 Jul 17, 2014  1.2 Jul 27, 2014  4.62 Jul 28, 2014  5.03  Chart review: 02/15 - Rectal cancer T3N1,  s/p lap LAR w loop ileostomy 07/15 - Reversal of ileostomy, did well postop and dc'd home POD 3 08/15 - Malaise, nausea, anorexia, wt loss > dx with partial SBO, rx'd nonsurgically, presumed HCAP, rectal Ca s/p course of Xeloda finished in June, AKI , FTT   ROS  no abd pain  no diarrhea  no cp, no sob  no fevers   Past Medical History  Past Medical History  Diagnosis Date  . Medical history non-contributory   . HDL deficiency   . Allergy     seasonal allergic rhinitis  . History of radiation therapy 10/16/13-11/27/13    rectal 50.4Gy  . Peripheral vascular disease 4/15    DVT LEFT LEG  . Cancer 09/19/13    rectum   Past Surgical History  Past Surgical History  Procedure Laterality Date  . Left leg surgery  1986    rod from hip to knee  . Colonoscopy with propofol N/A 09/19/2013    Procedure: COLONOSCOPY WITH PROPOFOL;  Surgeon: Garlan Fair, MD;  Location: [facility];  Service: Endoscopy;  Laterality: N/A;  . Femur fracture surgery Left 1985    steel rod  . Eus N/A 09/27/2013    Procedure: LOWER ENDOSCOPIC  ULTRASOUND (EUS);  Surgeon: Arta Silence, MD;  Location: [facility];  Service: Endoscopy;  Laterality: N/A;  . Laparoscopic low anterior resection N/A 01/12/2014    Procedure: LAPAROSCOPIC LOW ANTERIOR RESECTION ;  Surgeon: Leighton Ruff, MD;  Location: [facility];  Service: General;  Laterality: N/A;  . Ileo loop diversion N/A 01/12/2014    Procedure:   LOOP ILEOSTOMY;  Surgeon: Leighton Ruff, MD;  Location: [facility];  Service: General;  Laterality: N/A;  . Ileostomy closure N/A 06/07/2014    Procedure: ILEOSTOMY reversal ;  Surgeon: Leighton Ruff, MD;  Location: [facility];  Service: General;  Laterality: N/A;   Family History  Family History  Problem Relation Age of Onset  . Hypertension Mother   . Diabetes Father   . Heart disease Father     CAD   Social History  reports that she has never smoked. She has never used smokeless tobacco. She reports that she drinks alcohol. She reports that she does not use illicit drugs. Allergies  Allergies  Allergen Reactions  . Ointment Base [Lanolin-Petrolatum] Rash    Specifically burn ointments.   Home medications Prior to Admission medications   Medication Sig Start Date End Date Taking? Authorizing Provider  cycloSPORINE (RESTASIS) 0.05 %  ophthalmic emulsion Place 1 drop into both eyes 2 (two) times daily.   Yes Historical Provider, MD  enoxaparin (LOVENOX) 100 MG/ML injection Inject 100 mg into the skin daily.   Yes Historical Provider, MD  Ipratropium-Albuterol (COMBIVENT) 20-100 MCG/ACT AERS respimat Inhale 1 puff into the lungs every 6 (six) hours as needed for wheezing. 07/14/14  Yes Barton Dubois, MD  lactose free nutrition (BOOST PLUS) LIQD Take 237 mLs by mouth 3 (three) times daily between meals. 07/14/14  Yes Barton Dubois, MD  magnesium hydroxide (MILK OF MAGNESIA) 400 MG/5ML suspension Take 30 mLs by mouth daily as needed for mild constipation.   Yes Historical Provider, MD  potassium chloride SA (K-DUR,KLOR-CON) 20 MEQ tablet Take 2 tablets  (40 mEq total) by mouth daily. 07/14/14  Yes Barton Dubois, MD  Probiotic Product (TRUNATURE DIGESTIVE PROBIOTIC PO) Take 1 capsule by mouth daily.   Yes Historical Provider, MD   Liver Function Tests  Recent Labs Lab 07/27/14 1849 07/28/14 0624  AST 32 36  ALT 29 30  ALKPHOS 103 94  BILITOT 0.5 0.6  PROT 9.0* 8.1  ALBUMIN 3.8 3.4*   No results found for this basename: LIPASE, AMYLASE,  in the last 168 hours CBC  Recent Labs Lab 07/27/14 1849 07/28/14 0624  WBC 7.5 7.5  NEUTROABS 6.4  --   HGB 11.1* 10.6*  HCT 31.2* 29.8*  MCV 75.2* 75.1*  PLT 467* 696*   Basic Metabolic Panel  Recent Labs Lab 07/27/14 1849 07/27/14 2336 07/28/14 0624 07/28/14 1207  NA 107* 111* 110* 113*  K 4.3 4.9 4.8 4.2  CL <65* <65* <65* <65*  CO2 38* 40* 40* 40*  GLUCOSE 126* 121* 118* 139*  BUN 50* 50* 53* 53*  CREATININE 4.62* 4.96* 5.12* 5.03*  CALCIUM 10.1 9.7 9.2 9.5  PHOS 3.4  --   --   --     Filed Vitals:   07/27/14 1739 07/27/14 2129 07/28/14 0418 07/28/14 1318  BP: 171/95 140/78 129/75 138/81  Pulse: 95 90 92 87  Temp: 97.3 F (36.3 C) 98.4 F (36.9 C) 98.8 F (37.1 C) 97.4 F (36.3 C)  TempSrc: Oral Oral Oral Axillary  Resp: 20 24 32 24  Height: 5' 5"  (1.651 m)     Weight: 66.271 kg (146 lb 1.6 oz)  67.2 kg (148 lb 2.4 oz)   SpO2: 94% 94% 95% 98%   Exam Groggy after phenergan IV, Ox 3 No rash, cyanosis or gangrene Sclera anicteric, throat clear Flat neck veins Clear lungs bilat RRR no MRG Abd soft, NTND, no ascites or mass No LE or UE edema Skin turgor down a bit Neuro is nf, Ox 3, groggy  UA 30 protein, 3-6 rbc, many bact, many epis, no wbc Bladder scan = 499 cc  Assessment/Rec: 1 Acute renal failure - not sure exact cause, she is prob dry from vomiting hx and exam, and she has urinary retention. Will place foley and increase IVF's with bolus too.  No indication for HD, should improve. No acei/arb or nsaids, no dye exposure. 2 Hyponatremia - severe,  hypovolemic. Not symptomatic. I think her grogginess if from the Phenergan.  Will reduce phenergan to 12.5 mg every 6 hours prn. Treatment should be with normal saline for now. It is very important that she not get any other sedating meds or narcotics for the next 24 hours or so, so that we can closely follow her mental status during recovery from hyponatremia.   Check urine osm.  If it doesn't improve then will use hypertonic saline or tolvaptan next.  Check bmets every 6 hours.  3 Rectal cancer , s/p LAR in May    Rob Cale Bethard MD (pgr) [phone]    ([id number] 07/28/2014, 2:37 PM

## 2014-07-28 NOTE — Progress Notes (Signed)
IP PROGRESS NOTE  Subjective:   She feels better. No pain. Mild nausea. No dyspnea. She is urinating.  Objective: Vital signs in last 24 hours: Blood pressure 129/75, pulse 92, temperature 98.8 F (37.1 C), temperature source Oral, resp. rate 32, height 5' 5"  (1.651 m), weight 148 lb 2.4 oz (67.2 kg), SpO2 95.00%.  Intake/Output from previous day: 08/28 0701 - 08/29 0700 In: 1164.6 [P.O.:90; I.V.:1074.6] Out: 300 [Urine:300]  Physical Exam:  HEENT: No thrush Lungs: Inspiratory rhonchi at the lower chest bilaterally and right upper anterior chest, no respiratory distress Cardiac: Regular rate and rhythm Abdomen: Mildly distended, nontender, bowel sounds are present Extremities: No leg edema     Lab Results:  Recent Labs  07/27/14 1849 07/28/14 0624  WBC 7.5 7.5  HGB 11.1* 10.6*  HCT 31.2* 29.8*  PLT 467* 411*    BMET  Recent Labs  07/27/14 2336 07/28/14 0624  NA 111* 110*  K 4.9 4.8  CL <65* <65*  CO2 40* 40*  GLUCOSE 121* 118*  BUN 50* 53*  CREATININE 4.96* 5.12*  CALCIUM 9.7 9.2    Studies/Results: US Abdomen Complete  07/28/2014   CLINICAL DATA:  Followup abnormal gallbladder on CT.  Liver lesions.  EXAM: ULTRASOUND ABDOMEN COMPLETE  COMPARISON:  CT, 07/18/2014.  FINDINGS: Gallbladder:  Large gallstone. Multiple echogenic foci along the gallbladder wall. There is another gallstone in the gallbladder neck measuring 1.7 cm. Wall is thickened to 5.2 mm.  Common bile duct:  Diameter: 6 mm.  No duct stone seen.  Liver:  Liver has a heterogeneous echotexture. There are several liver lesions 14 mm hypoechoic lesion lies in the inferior right lobe. A smaller lesion is noted in the inferior left lobe. Hepatopetal flow documented in the portal vein.  IVC:  No abnormality visualized.  Pancreas:  Not well visualized.  Portions visualized are unremarkable.  Spleen:  Size and appearance within normal limits.  Right Kidney:  Length: 10 cm. Echogenicity within normal  limits. No mass or hydronephrosis visualized.  Left Kidney:  Length: 10 cm. Echogenicity within normal limits. No mass or hydronephrosis visualized.  Abdominal aorta:  No aneurysm visualized.  Other findings:  None.  IMPRESSION: 1. Gallstones with 1 stone measuring 1.7 cm lodged within the gallbladder neck. Wall is thickened to 5.2 mm. Multiple echogenic foci project along the gallbladder wall. This could reflect a air. Findings support acute cholecystitis in the proper clinical setting. 2. Hypoechoic liver lesions which are nonspecific. They do not appear to be cysts. Largest lies in the right lobe measuring 15 mm. Liver shows a diffusely coarsened increased echotexture consistent with hepatic steatosis. 3. No bile duct dilation.   Electronically Signed   By: Lajean Manes M.D.   On: 07/28/2014 08:50    Medications: I have reviewed the patient's current medications.  Assessment/Plan: 1. 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  Initiation of radiation and concurrent Xeloda 10/16/2013.  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  Cycle 1 adjuvant Xeloda 02/05/2014.  Cycle 2 adjuvant Xeloda 02/26/2014.  Cycle 3 adjuvant Xeloda 03/18/2014.  Cycle 4 adjuvant Xeloda 04/08/2014.  2. Indeterminate right lung nodule on CT 09/29/2013. 3. History of weight loss. She has gained weight. 4. Left leg DVT 03/15/2014. Lovenox currently on hold with acute renal failure 5. Admission for ileostomy reversal 06/07/2014. 6. Hospitalization 07/11/2014 through 07/14/2014 with a small bowel obstruction. Patient felt  to most likely have a narrow area at her small bowel anastomosis due to inflammation after surgery and not a true obstruction. 7. Elevated CEA 07/14/2014. 8. CT chest/abdomen/pelvis 07/18/2014 with a new small right pleural effusion; stable 5 mm posterior right upper  lobe lung nodule; gallstones noted within the gallbladder; marked gallbladder wall thickening measuring up to 11 mm in thickness at the fundus; abnormal low density noted in the adjacent inferior liver with small scattered satellite nodules in the adjacent liver. Findings within the liver new since the prior study. Small amount of perihepatic fluid adjacent to the right hepatic lobe and gallbladder. Extensive edema/stranding within the pelvis extending from the presacral space anteriorly. New area of sclerosis right pubic bone.  Abdominal ultrasound 07/28/2014 concerning for acute cholecystitis 9. Acute renal failure   her clinical status appears improved today, but she has developed acute renal failure. The elevated BUN/creatinine does not appear clearly related to dehydration. The patient and her husband report she has had adequate fluid intake over the past few days.  She may have cholecystitis.  Ms. Stills appears to have metastatic rectal cancer based on the CT findings and elevated CEA.  Recommendations: 1. Nephrology consult 2. hold Lovenox anticoagulation until renal failure improves 3. plans for  liver biopsy on hold 4. continue antibiotics, consider surgical consult to evaluate the gallbladder and possibility of an intra-abdominal infection. We need to consider a repeat CT scan without IV contrast if her clinical condition does not improve.       LOS: 1 day   Marinna Blane  07/28/2014, 10:20 AM

## 2014-07-28 NOTE — Progress Notes (Addendum)
CRITICAL VALUE ALERT  Critical value received:  Na=113, Cl<65, CO2=40   Date of notification: 07/28/14  Time of notification:  8891  Critical value read back:Yes.    Nurse who received alert:  Tyler Aas  MD notified (1st page):  Dr. Charlies Silvers  Time of first page:  1257  Responding MD:  Dr. Charlies Silvers  Time MD responded:  1300 (no orders received)   Maryjo Rochester, Laurel Dimmer

## 2014-07-28 NOTE — Progress Notes (Signed)
CRITICAL VALUE ALERT  Critical value received:  Na 113 Chl <65  Date of notification:  07/28/14  Time of notification:  2130  Critical value read back:Yes.    Nurse who received alert:  J.Rudell Marlowe  MD notified (1st page):  T.Callahan  Time of first page:  2135  MD notified (2nd page):  Time of second page:  Responding MD:  T.Callahan  Time MD responded: 2137  No new orders. Will continue to monitor

## 2014-07-28 NOTE — Progress Notes (Addendum)
Patient ID: Victoria [name], female   DOB: 1951-03-17, 63 y.o.   MRN: [id number] [facility] PROGRESS NOTE  Victoria [name] EPP:[id number] DOB: 03/27/1951 DOA: 07/27/2014 PCP: Irven Shelling, MD  Brief narrative: 63 year old female with history of rectal adenocarcinoma, status post radiation and chemotherapy, status post resection and diverting ileostomy in 12/2013, ileostomy reversal on 06/07/2014, history of DVT on anticoagulation with Lovenox, recent hospitalization for small bowel obstruction 07/11/14 through 07/14/2014 who presented from cancer center with nausea and vomiting, failure to thrive. Abdominal US showed possible acute cholecystitis for which she is on zosyn. In addition, she was found to have sodium level of 107. Renal consulted. She had recent CT scan done which among other things was significant for gallbladder wall thickening up to 11 mm in thickness.   Assessment & Plan   Principal Problem:  Acute cholecystitis  Started empiric zosyn. No significant pain on physical exam.  Please note acute cholecystitis noted on abdominal US. May need CT abd if she does not improve for further evaluation.  Continue supportive care with IV fluids, antiemetics PRN.  Active Problems:  Rectal adenocarcinoma with liver metastases  Recent CT abdomen done 07/18/2014 with findings of new small right pleural effusion; stable 5 mm posterior right upper lobe lung nodule; gallstones noted within the gallbladder; marked gallbladder wall thickening measuring up to 11 mm in thickness at the fundus; abnormal low density noted in the adjacent inferior liver with small scattered satellite nodules in the adjacent liver. Findings within the liver new since the prior study. Small amount of perihepatic fluid adjacent to the right hepatic lobe and gallbladder. Extensive edema/stranding within the pelvis extending from the presacral space anteriorly. New area of sclerosis right pubic bone.   Appreciate oncology following Acute renal failure  Possibly contrast induced nephropathy  Creatinine starting to trend down  Renal consulted  Hyponatremia  Likely dehydration  Continue IV fluids  Sodium improving  Renal consulted  Anemia of chronic disease  Likely due to history of malignancy  Hemoglobin stable  Continue to monitor CBC History of DVT, LLE 02/2014  Restarted Lovenox subQ. Severe protein calorie malnutrition  Nutrition consulted  DVT prophylaxis  On therapeutic anticoagulation with Lovenox   Code Status: Full.  Family Communication:  plan of care discussed with the patient Disposition Plan: Home when stable.    IV Access:   Peripheral IV Procedures and diagnostic studies:    US Abdomen Complete 07/28/2014    1. Gallstones with 1 stone measuring 1.7 cm lodged within the gallbladder neck. Wall is thickened to 5.2 mm. Multiple echogenic foci project along the gallbladder wall. This could reflect a air. Findings support acute cholecystitis in the proper clinical setting. 2. Hypoechoic liver lesions which are nonspecific. They do not appear to be cysts. Largest lies in the right lobe measuring 15 mm. Liver shows a diffusely coarsened increased echotexture consistent with hepatic steatosis. 3. No bile duct dilation.    Medical Consultants:   Oncology (Dr. Betsy Coder) Nephrology (Dr. Roney Jaffe) Other Consultants:   None  Anti-Infectives:   Zosyn 07/27/2014 -->   Leisa Lenz, MD  [facility] Pager [phone]  If 7PM-7AM, please contact night-coverage [email or web address] Password [email or web address] 07/28/2014, 5:50 PM   LOS: 1 day    HPI/Subjective: No acute overnight events.  Objective: Filed Vitals:   07/27/14 1739 07/27/14 2129 07/28/14 0418 07/28/14 1318  BP: 171/95 140/78 129/75 138/81  Pulse: 95 90 92 87  Temp: 97.3 F (36.3 C) 98.4 F (  36.9 C) 98.8 F (37.1 C) 97.4 F (36.3 C)  TempSrc: Oral Oral Oral Axillary  Resp: 20 24 32 24   Height: 5\' 5"  (1.651 m)     Weight: 66.271 kg (146 lb 1.6 oz)  67.2 kg (148 lb 2.4 oz)   SpO2: 94% 94% 95% 98%    Intake/Output Summary (Last 24 hours) at 07/28/14 1750 Last data filed at 07/28/14 1633  Gross per 24 hour  Intake 2734.58 ml  Output    550 ml  Net 2184.58 ml    Exam:   General:  Pt is alert, follows commands appropriately, not in acute distress  Cardiovascular: Regular rate and rhythm, S1/S2, no murmurs  Respiratory: bilateral air entry, no wheezing   Abdomen: non tender but distended, bowel sounds present  Extremities: No edema, pulses DP and PT palpable bilaterally  Neuro: Grossly nonfocal  Data Reviewed: Basic Metabolic Panel:  Recent Labs Lab 07/27/14 1849 07/27/14 2336 07/28/14 0624 07/28/14 1207  NA 107* 111* 110* 113*  K 4.3 4.9 4.8 4.2  CL <65* <65* <65* <65*  CO2 38* 40* 40* 40*  GLUCOSE 126* 121* 118* 139*  BUN 50* 50* 53* 53*  CREATININE 4.62* 4.96* 5.12* 5.03*  CALCIUM 10.1 9.7 9.2 9.5  MG 1.8  --   --   --   PHOS 3.4  --   --   --    Liver Function Tests:  Recent Labs Lab 07/27/14 1849 07/28/14 0624  AST 32 36  ALT 29 30  ALKPHOS 103 94  BILITOT 0.5 0.6  PROT 9.0* 8.1  ALBUMIN 3.8 3.4*   No results found for this basename: LIPASE, AMYLASE,  in the last 168 hours No results found for this basename: AMMONIA,  in the last 168 hours CBC:  Recent Labs Lab 07/27/14 1849 07/28/14 0624  WBC 7.5 7.5  NEUTROABS 6.4  --   HGB 11.1* 10.6*  HCT 31.2* 29.8*  MCV 75.2* 75.1*  PLT 467* 411*   Cardiac Enzymes: No results found for this basename: CKTOTAL, CKMB, CKMBINDEX, TROPONINI,  in the last 168 hours BNP: No components found with this basename: POCBNP,  CBG:  Recent Labs Lab 07/28/14 0846  GLUCAP 132*    No results found for this or any previous visit (from the past 240 hour(s)).   Scheduled Meds: . cycloSPORINE  1 drop Both Eyes BID  . enoxaparin (LOVENOX) injection  70 mg Subcutaneous Q24H  . lactose free  nutrition  237 mL Oral TID BM  . piperacillin-tazobactam (ZOSYN)  IV  2.25 g Intravenous 3 times per day   Continuous Infusions: . sodium chloride 100 mL/hr at 07/28/14 1454

## 2014-07-29 ENCOUNTER — Inpatient Hospital Stay (HOSPITAL_COMMUNITY): Payer: BC Managed Care – PPO

## 2014-07-29 DIAGNOSIS — R143 Flatulence: Secondary | ICD-10-CM

## 2014-07-29 DIAGNOSIS — R142 Eructation: Secondary | ICD-10-CM

## 2014-07-29 DIAGNOSIS — R141 Gas pain: Secondary | ICD-10-CM

## 2014-07-29 DIAGNOSIS — R112 Nausea with vomiting, unspecified: Secondary | ICD-10-CM

## 2014-07-29 LAB — BASIC METABOLIC PANEL
Anion gap: 15 (ref 5–15)
BUN: 48 mg/dL — ABNORMAL HIGH (ref 6–23)
BUN: 46 mg/dL — ABNORMAL HIGH (ref 6–23)
BUN: 44 mg/dL — ABNORMAL HIGH (ref 6–23)
BUN: 41 mg/dL — ABNORMAL HIGH (ref 6–23)
CALCIUM: 8.8 mg/dL (ref 8.4–10.5)
CALCIUM: 9 mg/dL (ref 8.4–10.5)
CO2: 36 meq/L — AB (ref 19–32)
CO2: 36 mEq/L — ABNORMAL HIGH (ref 19–32)
CO2: 36 mEq/L — ABNORMAL HIGH (ref 19–32)
CO2: 37 mEq/L — ABNORMAL HIGH (ref 19–32)
CREATININE: 3.96 mg/dL — AB (ref 0.50–1.10)
Calcium: 8.9 mg/dL (ref 8.4–10.5)
Calcium: 8.9 mg/dL (ref 8.4–10.5)
Chloride: <65 mEq/L — CL (ref 96–112)
Chloride: <65 mEq/L — CL (ref 96–112)
Chloride: 69 mEq/L — ABNORMAL LOW (ref 96–112)
Creatinine, Ser: 3.58 mg/dL — ABNORMAL HIGH (ref 0.50–1.10)
Creatinine, Ser: 3.47 mg/dL — ABNORMAL HIGH (ref 0.50–1.10)
Creatinine, Ser: 3.14 mg/dL — ABNORMAL HIGH (ref 0.50–1.10)
GFR calc Af Amer: 13 mL/min — ABNORMAL LOW (ref >90)
GFR calc Af Amer: 15 mL/min — ABNORMAL LOW (ref >90)
GFR calc non Af Amer: 11 mL/min — ABNORMAL LOW (ref >90)
GFR calc non Af Amer: 13 mL/min — ABNORMAL LOW (ref >90)
GFR calc non Af Amer: 13 mL/min — ABNORMAL LOW (ref >90)
GFR, EST AFRICAN AMERICAN: 15 mL/min — AB (ref >90)
GFR, EST AFRICAN AMERICAN: 17 mL/min — AB (ref >90)
GFR, EST NON AFRICAN AMERICAN: 15 mL/min — AB (ref >90)
GLUCOSE: 105 mg/dL — AB (ref 70–99)
GLUCOSE: 101 mg/dL — AB (ref 70–99)
GLUCOSE: 103 mg/dL — AB (ref 70–99)
GLUCOSE: 85 mg/dL (ref 70–99)
POTASSIUM: 3.3 meq/L — AB (ref 3.7–5.3)
POTASSIUM: 3.2 meq/L — AB (ref 3.7–5.3)
Potassium: 3 mEq/L — ABNORMAL LOW (ref 3.7–5.3)
Potassium: 2.7 mEq/L — CL (ref 3.7–5.3)
SODIUM: 114 meq/L — AB (ref 137–147)
SODIUM: 117 meq/L — AB (ref 137–147)
Sodium: 113 mEq/L — CL (ref 137–147)
Sodium: 121 mEq/L — CL (ref 137–147)

## 2014-07-29 LAB — BLOOD GAS, ARTERIAL
ACID-BASE EXCESS: 13.9 mmol/L — AB (ref 0.0–2.0)
Bicarbonate: 38.2 mEq/L — ABNORMAL HIGH (ref 20.0–24.0)
DRAWN BY: 276051
O2 CONTENT: 2 L/min
O2 SAT: 96.4 %
Patient temperature: 99.2
TCO2: 34.2 mmol/L (ref 0–100)
pCO2 arterial: 45.7 mmHg — ABNORMAL HIGH (ref 35.0–45.0)
pH, Arterial: 7.533 — ABNORMAL HIGH (ref 7.350–7.450)
pO2, Arterial: 77.7 mmHg — ABNORMAL LOW (ref 80.0–100.0)

## 2014-07-29 LAB — GLUCOSE, CAPILLARY: Glucose-Capillary: 101 mg/dL — ABNORMAL HIGH (ref 70–99)

## 2014-07-29 LAB — NA AND K (SODIUM & POTASSIUM), RAND UR
Potassium Urine: 63 mEq/L
Sodium, Ur: 17 mEq/L

## 2014-07-29 LAB — MRSA PCR SCREENING: MRSA by PCR: NEGATIVE

## 2014-07-29 LAB — OSMOLALITY, URINE: Osmolality, Ur: 352 mOsm/kg — ABNORMAL LOW (ref 390–1090)

## 2014-07-29 MED ORDER — SODIUM CHLORIDE 0.9 % IJ SOLN
10.0000 mL | INTRAMUSCULAR | Status: DC | PRN
  Administered 2014-07-31 – 2014-08-02 (×4): 10 mL
  Administered 2014-08-02: 20 mL
  Administered 2014-08-03 – 2014-08-04 (×2): 10 mL

## 2014-07-29 MED ORDER — CETYLPYRIDINIUM CHLORIDE 0.05 % MT LIQD
7.0000 mL | Freq: Two times a day (BID) | OROMUCOSAL | Status: DC
  Administered 2014-08-02 – 2014-08-10 (×11): 7 mL via OROMUCOSAL

## 2014-07-29 MED ORDER — SODIUM CHLORIDE 0.9 % IJ SOLN
10.0000 mL | Freq: Two times a day (BID) | INTRAMUSCULAR | Status: DC
  Administered 2014-07-29 – 2014-08-12 (×10): 10 mL

## 2014-07-29 MED ORDER — SODIUM CHLORIDE 3 % IV SOLN
INTRAVENOUS | Status: DC
  Filled 2014-07-29 (×2): qty 500

## 2014-07-29 MED ORDER — CHLORHEXIDINE GLUCONATE 0.12 % MT SOLN
15.0000 mL | Freq: Two times a day (BID) | OROMUCOSAL | Status: DC
  Administered 2014-07-30 – 2014-08-12 (×16): 15 mL via OROMUCOSAL
  Filled 2014-07-29 (×33): qty 15

## 2014-07-29 MED ORDER — SODIUM CHLORIDE 0.9 % IV SOLN
INTRAVENOUS | Status: DC
  Administered 2014-07-29: 22:00:00 via INTRAVENOUS

## 2014-07-29 MED ORDER — POTASSIUM CHLORIDE 10 MEQ/100ML IV SOLN
10.0000 meq | INTRAVENOUS | Status: AC
  Administered 2014-07-29 (×5): 10 meq via INTRAVENOUS
  Filled 2014-07-29 (×5): qty 100

## 2014-07-29 NOTE — Progress Notes (Signed)
Peripherally Inserted Central Catheter/Midline Placement  The IV Nurse has discussed with the patient and/or persons authorized to consent for the patient, the purpose of this procedure and the potential benefits and risks involved with this procedure.  The benefits include less needle sticks, lab draws from the catheter and patient may be discharged home with the catheter.  Risks include, but not limited to, infection, bleeding, blood clot (thrombus formation), and puncture of an artery; nerve damage and irregular heat beat.  Alternatives to this procedure were also discussed.  PICC/Midline Placement Documentation  PICC / Midline Double Lumen 45/36/46 PICC Right Basilic 41 cm 0 cm (Active)  Indication for Insertion or Continuance of Line Administration of hyperosmolar/irritating solutions (i.e. TPN, Vancomycin, etc.);Limited venous access - need for IV therapy >5 days (PICC only) 07/29/2014  7:13 PM  Exposed Catheter (cm) 0 cm 07/29/2014  7:13 PM  Site Assessment Clean;Dry;Intact 07/29/2014  7:13 PM  Lumen #1 Status Flushed;Saline locked;Blood return noted 07/29/2014  7:13 PM  Lumen #2 Status Flushed;Saline locked;Blood return noted 07/29/2014  7:13 PM  Dressing Type Transparent 07/29/2014  7:13 PM  Dressing Status Clean;Dry;Intact;Antimicrobial disc in place 07/29/2014  7:13 PM  Line Care Connections checked and tightened 07/29/2014  7:13 PM  Line Adjustment (NICU/IV Team Only) No 07/29/2014  7:13 PM  Dressing Intervention New dressing 07/29/2014  7:13 PM  Dressing Change Due 08/05/14 07/29/2014  7:13 PM       Rolena Infante 07/29/2014, 7:14 PM

## 2014-07-29 NOTE — Progress Notes (Signed)
CRITICAL VALUE ALERT  Critical value received:  Na 133 Chl <65  Date of notification:  07/28/14  Time of notification:  0225   Critical value read back:Yes.    Nurse who received alert: J.Tayvon Culley  MD notified (1st page):  N/A  Time of first page:  N/A  MD notified (2nd page):  Time of second page:  Responding MD:  N/A  Time MD responded:  N/A Pt. Blood lvl is the same. Will Continue to monitor

## 2014-07-29 NOTE — Progress Notes (Signed)
CRITICAL VALUE ALERT  Critical value received:  Na 114, K 2.7, Chloride <65  Date of notification:  8/30  Time of notification:  0815  Critical value read back:Yes.    Nurse who received alert:  Bobbye Charleston RN  MD notified (1st page):  [place]  Time of first page:  MD on floor critical values read verbally  MD notified (2nd page):  Time of second page:  Responding MD:  Jonnie Finner  Time MD responded:  [phone]

## 2014-07-29 NOTE — Progress Notes (Signed)
IP PROGRESS NOTE  Subjective:   She is lethargic this morning after Phenergan. She now has nausea and vomiting.  Objective: Vital signs in last 24 hours: Blood pressure 135/73, pulse 82, temperature 98.9 F (37.2 C), temperature source Oral, resp. rate 17, height 5' 5"  (1.651 m), weight 146 lb 13.2 oz (66.6 kg), SpO2 96.00%.  Intake/Output from previous day: 08/29 0701 - 08/30 0700 In: 2560 [P.O.:1200; I.V.:1210; IV Piggyback:150] Out: 1950 [Urine:1050; Emesis/NG output:900]  Physical Exam: Lethargic, arousable HEENT: No thrush Lungs: Clear anteriorly Cardiac: Regular rate and rhythm Abdomen: Mildly distended, nontender, hypoactive bowel sounds      Lab Results:  Recent Labs  07/27/14 1849 07/28/14 0624  WBC 7.5 7.5  HGB 11.1* 10.6*  HCT 31.2* 29.8*  PLT 467* 411*    BMET  Recent Labs  07/29/14 0148 07/29/14 0732  NA 113* 114*  K 3.0* 2.7*  CL <65* <65*  CO2 36* 36*  GLUCOSE 105* 101*  BUN 48* 46*  CREATININE 3.96* 3.58*  CALCIUM 8.8 9.0    Studies/Results: US Abdomen Complete  07/28/2014   CLINICAL DATA:  Followup abnormal gallbladder on CT.  Liver lesions.  EXAM: ULTRASOUND ABDOMEN COMPLETE  COMPARISON:  CT, 07/18/2014.  FINDINGS: Gallbladder:  Large gallstone. Multiple echogenic foci along the gallbladder wall. There is another gallstone in the gallbladder neck measuring 1.7 cm. Wall is thickened to 5.2 mm.  Common bile duct:  Diameter: 6 mm.  No duct stone seen.  Liver:  Liver has a heterogeneous echotexture. There are several liver lesions 14 mm hypoechoic lesion lies in the inferior right lobe. A smaller lesion is noted in the inferior left lobe. Hepatopetal flow documented in the portal vein.  IVC:  No abnormality visualized.  Pancreas:  Not well visualized.  Portions visualized are unremarkable.  Spleen:  Size and appearance within normal limits.  Right Kidney:  Length: 10 cm. Echogenicity within normal limits. No mass or hydronephrosis visualized.   Left Kidney:  Length: 10 cm. Echogenicity within normal limits. No mass or hydronephrosis visualized.  Abdominal aorta:  No aneurysm visualized.  Other findings:  None.  IMPRESSION: 1. Gallstones with 1 stone measuring 1.7 cm lodged within the gallbladder neck. Wall is thickened to 5.2 mm. Multiple echogenic foci project along the gallbladder wall. This could reflect a air. Findings support acute cholecystitis in the proper clinical setting. 2. Hypoechoic liver lesions which are nonspecific. They do not appear to be cysts. Largest lies in the right lobe measuring 15 mm. Liver shows a diffusely coarsened increased echotexture consistent with hepatic steatosis. 3. No bile duct dilation.   Electronically Signed   By: Lajean Manes M.D.   On: 07/28/2014 08:50    Medications: I have reviewed the patient's current medications.  Assessment/Plan: 1. 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  Initiation of radiation and concurrent Xeloda 10/16/2013.  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  Cycle 1 adjuvant Xeloda 02/05/2014.  Cycle 2 adjuvant Xeloda 02/26/2014.  Cycle 3 adjuvant Xeloda 03/18/2014.  Cycle 4 adjuvant Xeloda 04/08/2014.  2. Indeterminate right lung nodule on CT 09/29/2013. 3. History of weight loss.  4. Left leg DVT 03/15/2014. Lovenox currently dosed by pharmacy for renal failure 5. Admission for ileostomy reversal 06/07/2014. 6. Hospitalization 07/11/2014 through 07/14/2014 with a small bowel obstruction. Patient felt to most likely have a narrow area at her small bowel anastomosis due to  inflammation after surgery and not a true obstruction. 7. Elevated CEA 07/14/2014. 8. CT chest/abdomen/pelvis 07/18/2014 with a new small right pleural effusion; stable 5 mm posterior right upper lobe lung nodule; gallstones noted within the gallbladder; marked  gallbladder wall thickening measuring up to 11 mm in thickness at the fundus; abnormal low density noted in the adjacent inferior liver with small scattered satellite nodules in the adjacent liver. Findings within the liver new since the prior study. Small amount of perihepatic fluid adjacent to the right hepatic lobe and gallbladder. Extensive edema/stranding within the pelvis extending from the presacral space anteriorly. New area of sclerosis right pubic bone.  Abdominal ultrasound 07/28/2014 concerning for acute cholecystitis 9. Acute renal failure/hyponatremia-seen by nephrology, renal failure improved with hydration 10. Nausea and vomiting-potentially related to renal failure, hyponatremia, or an intra-abdominal process   she has developed nausea/vomiting. The abdomen is distended and firm. I am concerned she an intra-abdominal infection or bowel obstruction. I discussed the case with Dr. Charlies Silvers. She will obtain a plain x-ray and consult surgery.  Ms. Victoria [name] appears to have metastatic rectal cancer based on the CT findings and elevated CEA.  Recommendations: 1. continue management of renal failure and hyponatremia per nephrology 2. consult surgery to evaluate the nausea/vomiting and abdominal findings 3. plans for  liver biopsy on hold 4. hold Phenergan secondary to excessive sedation  She appears ill today. I discussed the situation with her husband. She is being moved to the ICU step down unit.       LOS: 2 days   Victoria [name]  07/29/2014, 9:07 AM

## 2014-07-29 NOTE — Progress Notes (Signed)
CRITICAL VALUE ALERT  Critical value received:  Na+ 121  Date of notification:  07/29/2014  Time of notification:  20:35  Critical value read back:Yes.    Nurse who received alert:  A. Tamala Julian  MD notified (1st page):  Fredirick Maudlin  Time of first page:  20:40  MD notified (2nd page):  Time of second page:  Responding MD:  Fredirick Maudlin  Time MD responded:  20:45

## 2014-07-29 NOTE — Progress Notes (Addendum)
Patient ID: Victoria [name], female   DOB: [birth year], 63 y.o.   MRN: [id number] [facility] PROGRESS NOTE  Victoria [name] ELF:[id number] DOB: [birth year] DOA: 07/27/2014 PCP: Irven Shelling, MD  Brief narrative: 63 year old female with history of rectal adenocarcinoma, status post radiation and chemotherapy, status post resection and diverting ileostomy in 12/2013, ileostomy reversal on 06/07/2014, history of DVT on anticoagulation with Lovenox, recent hospitalization for small bowel obstruction 07/11/14 through 07/14/2014 who presented from cancer center with nausea and vomiting, failure to thrive. Abdominal US showed possible acute cholecystitis for which she is on zosyn. Patient had recent CT scan done which among other things was significant for gallbladder wall thickening up to 11 mm in thickness. Her hospital course is complicated with ongoing hyponatremia which improved from 107 to 113 with normal saline but it has reached a plateau of 113 so per renal she will require hypertonic saline and transfer to SDU  Assessment & Plan   Principal Problem:  Acute cholecystitis   Started empiric zosyn. No significant pain on physical exam. Please note acute cholecystitis noted on abdominal US. Pt does have adbominal distention on abdomen. Abdominal X ray with findings of gaseous distention and recommendation to put NG tube for decompression.  I spoke with surgery on call who reviewed imaging studies and recommended conservative management, no immediate surgical concern identified.  Continue supportive care with IV fluids (she will receive hypertonic saline for severe hyponatremia), antiemetics PRN. Active Problems:  Abdominal distention  As mentioned above, based on abdominal x ray gaseous distention likely a cause. Needs NG tube for decompression.  Rectal adenocarcinoma with liver metastases   Recent CT abdomen done 07/18/2014 with findings of new small right pleural effusion;  stable 5 mm posterior right upper lobe lung nodule; gallstones noted within the gallbladder; marked gallbladder wall thickening measuring up to 11 mm in thickness at the fundus; abnormal low density noted in the adjacent inferior liver with small scattered satellite nodules in the adjacent liver. Findings within the liver new since the prior study. Small amount of perihepatic fluid adjacent to the right hepatic lobe and gallbladder. Extensive edema/stranding within the pelvis extending from the presacral space anteriorly. New area of sclerosis right pubic bone.   Appreciate oncology following Acute renal failure   Possibly contrast induced nephropathy   Creatinine starting to trend down   Appreciate Dr. Jonnie Finner following and his recommendations.  Hypochloremic hyponatremia / metabolic alkalosis / hypokalemia  Likely dehydration, GI losses  Obtain ABG due to severity of alkalemia  Replete potassium  Needs hypertonic saline for which reason we will transfer to SDU. She will need central line placement.  Sodium has reached plateau to 113 with IV normal saline infusion.  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 Severe protein calorie malnutrition   Nutrition consulted. Will be NPO from today due to abdominal distention and need for NG tube placement for decompression. DVT prophylaxis   On therapeutic anticoagulation with Lovenox   Code Status: Full.  Family Communication: plan of care discussed with the patient  Disposition Plan: transfer to SDU, needs central line for hypotonic saline   IV Access:   Peripheral IV Procedures and diagnostic studies:    US Abdomen Complete 07/28/2014 1. Gallstones with 1 stone measuring 1.7 cm lodged within the gallbladder neck. Wall is thickened to 5.2 mm. Multiple  echogenic foci project along the gallbladder wall. This  could reflect a air. Findings support acute cholecystitis in the proper clinical setting. 2. Hypoechoic liver lesions which are nonspecific. They do not appear to be cysts. Largest lies in the right lobe measuring 15 mm. Liver shows a diffusely coarsened increased echotexture consistent with hepatic steatosis. 3. No bile duct dilation.   Abdominal X ray 07/29/2014 - abdominal distention likely due to gaseous distention in stomach. Consider NG tube for decompression.  Medical Consultants:   Oncology (Dr. Betsy Coder)  Nephrology (Dr. Roney Jaffe) Surgery   Other Consultants:   Nutrition    Anti-Infectives:   Zosyn 07/27/2014 -->   Leisa Lenz, MD  [facility] Pager [phone]  If 7PM-7AM, please contact night-coverage [email or web address] Password [email or web address] 07/29/2014, 8:14 AM   LOS: 2 days    HPI/Subjective: No acute overnight events.feels very weak this am, nauseous, lethargic.  Objective: Filed Vitals:   07/28/14 1318 07/28/14 2215 07/29/14 0427 07/29/14 0500  BP: 138/81 127/88 135/73   Pulse: 87 86 82   Temp: 97.4 F (36.3 C) 97.8 F (36.6 C) 98.9 F (37.2 C)   TempSrc: Axillary Oral Oral   Resp: 24 19 17    Height:      Weight:    66.6 kg (146 lb 13.2 oz)  SpO2: 98% 96% 96%     Intake/Output Summary (Last 24 hours) at 07/29/14 0814 Last data filed at 07/29/14 0600  Gross per 24 hour  Intake   2560 ml  Output   1950 ml  Net    610 ml    Exam:   General:  Pt is not in distress, feels weak, tired  Cardiovascular: Regular rate and rhythm, S1/S2, no murmurs  Respiratory: no wheezing, no crackles   Abdomen: firm, mild distention appreciated, non tender, (+) bowel sounds  Extremities: No edema, pulses DP and PT palpable bilaterally  Neuro: Grossly nonfocal  Data Reviewed: Basic Metabolic Panel:  Recent Labs Lab 07/27/14 1849  07/28/14 0624 07/28/14 1207 07/28/14 1745 07/28/14 2000 07/29/14 0148  NA 107*  < > 110* 113* 115* 113* 113*  K 4.3  <  > 4.8 4.2 3.6* 3.3* 3.0*  CL <65*  < > <65* <65* <65* <65* <65*  CO2 38*  < > 40* 40* 38* 37* 36*  GLUCOSE 126*  < > 118* 139* 118* 114* 105*  BUN 50*  < > 53* 53* 53* 51* 48*  CREATININE 4.62*  < > 5.12* 5.03* 4.54* 4.32* 3.96*  CALCIUM 10.1  < > 9.2 9.5 9.2 8.9 8.8  MG 1.8  --   --   --   --   --   --   PHOS 3.4  --   --   --   --   --   --   < > = values in this interval not displayed. Liver Function Tests:  Recent Labs Lab 07/27/14 1849 07/28/14 0624  AST 32 36  ALT 29 30  ALKPHOS 103 94  BILITOT 0.5 0.6  PROT 9.0* 8.1  ALBUMIN 3.8 3.4*   No results found for this basename: LIPASE, AMYLASE,  in the last 168 hours No results found for this basename: AMMONIA,  in the last 168 hours CBC:  Recent Labs Lab 07/27/14 1849 07/28/14 0624  WBC 7.5 7.5  NEUTROABS 6.4  --   HGB 11.1* 10.6*  HCT 31.2* 29.8*  MCV 75.2* 75.1*  PLT 467* 411*   Cardiac Enzymes: No results found for this  basename: CKTOTAL, CKMB, CKMBINDEX, TROPONINI,  in the last 168 hours BNP: No components found with this basename: POCBNP,  CBG:  Recent Labs Lab 07/28/14 0846 07/29/14 0742  GLUCAP 132* 101*    No results found for this or any previous visit (from the past 240 hour(s)).   Scheduled Meds: . cycloSPORINE  1 drop Both Eyes BID  . enoxaparin (LOVENOX) injection  70 mg Subcutaneous Q24H  . lactose free nutrition  237 mL Oral TID BM  . piperacillin-tazobactam (ZOSYN)  IV  2.25 g Intravenous 3 times per day   Continuous Infusions:

## 2014-07-29 NOTE — Progress Notes (Signed)
  [facility] Progress Note   Subjective: Got phenergan again last night for nausea, drowsy but fully oriented this am.   Filed Vitals:   07/28/14 1318 07/28/14 2215 07/29/14 0427 07/29/14 0500  BP: 138/81 127/88 135/73   Pulse: 87 86 82   Temp: 97.4 F (36.3 C) 97.8 F (36.6 C) 98.9 F (37.2 C)   TempSrc: Axillary Oral Oral   Resp: $Remo'24 19 17   'rELPO$ Height:      Weight:    66.6 kg (146 lb 13.2 oz)  SpO2: 98% 96% 96%    Exam: Lethargic, but arousable and Ox 3 Flat neck veins  Clear lungs bilat  RRR no MRG  Abd very firm throughout, NTND, no ascites or mass  No LE or UE edema  Neuro is nf, Ox 3, groggy   UA 30 protein, 3-6 rbc, many bact, many epis, no wbc  Bladder scan = 499 cc CT Abd 8/19 with 100 cc IV contrast  Assessment/Rec:  1 Acute renal failure - contrast , vol depletion. Improving creatinine. Keep foley in until renal failure resolved. 2 Hypochloremia - due to severe vomiting 3 Met alkalosis - serum CO2 down to 36, check ABG to see severity of alkalemia 4 Hyponatremia - severe, prob euvolemic with normal BP's and HR now.  Not improving with 0.9% saline, will need hypertonic saline. Urine studies pending 5 Hypokalemia - severe, 5 runs ordered, Mg ok at 1.8 6 Rectal cancer , s/p LAR in May   Rec- move to SDU/ICU, place PICC or central line for hypertonic saline, ABG ordered. Runs of IV Kcl ordered x 5. Have d/w primary and with CCM about access.     Kelly Splinter MD  pager [phone]    cell [phone]  07/29/2014, 8:21 AM     Recent Labs Lab 07/27/14 1849  07/28/14 2000 07/29/14 0148 07/29/14 0732  NA 107*  < > 113* 113* 114*  K 4.3  < > 3.3* 3.0* 2.7*  CL <65*  < > <65* <65* <65*  CO2 38*  < > 37* 36* 36*  GLUCOSE 126*  < > 114* 105* 101*  BUN 50*  < > 51* 48* 46*  CREATININE 4.62*  < > 4.32* 3.96* 3.58*  CALCIUM 10.1  < > 8.9 8.8 9.0  PHOS 3.4  --   --   --   --   < > = values in this interval not displayed.  Recent Labs Lab  07/27/14 1849 07/28/14 0624  AST 32 36  ALT 29 30  ALKPHOS 103 94  BILITOT 0.5 0.6  PROT 9.0* 8.1  ALBUMIN 3.8 3.4*    Recent Labs Lab 07/27/14 1849 07/28/14 0624  WBC 7.5 7.5  NEUTROABS 6.4  --   HGB 11.1* 10.6*  HCT 31.2* 29.8*  MCV 75.2* 75.1*  PLT 467* 411*   . cycloSPORINE  1 drop Both Eyes BID  . enoxaparin (LOVENOX) injection  70 mg Subcutaneous Q24H  . lactose free nutrition  237 mL Oral TID BM  . piperacillin-tazobactam (ZOSYN)  IV  2.25 g Intravenous 3 times per day  . potassium chloride  10 mEq Intravenous Q1 Hr x 5   . sodium chloride (hypertonic)     acetaminophen, acetaminophen, HYDROcodone-acetaminophen, ipratropium-albuterol, magnesium hydroxide, morphine injection, ondansetron (ZOFRAN) IV, promethazine

## 2014-07-29 NOTE — Progress Notes (Signed)
INITIAL NUTRITION ASSESSMENT  DOCUMENTATION CODES Per approved criteria  -Severe malnutrition in the context of chronic illness   INTERVENTION: Consider TPN RD to follow.  NUTRITION DIAGNOSIS: Inadequate oral intake related to altered GI function as evidenced by npo and high output per NG.   Goal: Tolerate diet advancement with intake of meals and supplements to meet >90% estimated needs.  Monitor:  Plan of care, diet advancement, labs, weight trend.  Reason for Assessment: Consult for assessment of nutritional status and needs.  63 y.o. female  Admitting Dx: Adult failure to thrive  ASSESSMENT: Patient with hx of rectal addenocarcinoma with liver metastasis.  S/P radiation.  S/P resection and diverting ileostomy in 12/2013, ileostomy reversal on 06/07/14.Marland Kitchen  Hx of DVT.  Recent hospital admits for SBO 8/12-8/15/15.  Presented to the cancer center with nausea and vomiting.  -Patient currently asleep.  Clear when arouses per nurse. -Unable to tolerate clear liquid diet. -Now NPO with NG tube in place.  2L output thus far. -Drinks Boost at home but other intake unknown at this time. -Hx includes severe protein calorie malnutrition.   Height: Ht Readings from Last 1 Encounters:  07/29/14 5\' 5"  (1.651 m)    Weight: Wt Readings from Last 1 Encounters:  07/29/14 152 lb 8 oz (69.174 kg)    Ideal Body Weight: 125 lbs  % Ideal Body Weight: 122  Wt Readings from Last 10 Encounters:  07/29/14 152 lb 8 oz (69.174 kg)  07/27/14 146 lb 12.8 oz (66.588 kg)  07/24/14 147 lb (66.679 kg)  07/20/14 151 lb 8 oz (68.72 kg)  07/14/14 154 lb 5.2 oz (70 kg)  06/20/14 161 lb 11.2 oz (73.347 kg)  06/19/14 161 lb 6.4 oz (73.211 kg)  06/07/14 163 lb (73.936 kg)  06/07/14 163 lb (73.936 kg)  05/29/14 163 lb (73.936 kg)    Usual Body Weight: 163 lbs  % Usual Body Weight: 93  BMI:  Body mass index is 25.38 kg/(m^2).  Estimated Nutritional Needs: Kcal: 1700-1800 Protein: 55-65  gm Fluid: 1.7L daily  Skin: intact  Diet Order:  NPO  EDUCATION NEEDS: -No education needs identified at this time   Intake/Output Summary (Last 24 hours) at 07/29/14 1534 Last data filed at 07/29/14 1130  Gross per 24 hour  Intake   1130 ml  Output   5325 ml  Net  -4195 ml    Labs:   Recent Labs Lab 07/27/14 1849  07/29/14 0148 07/29/14 0732 07/29/14 1157  NA 107*  < > 113* 114* 117*  K 4.3  < > 3.0* 2.7* 3.3*  CL <65*  < > <65* <65* <65*  CO2 38*  < > 36* 36* 36*  BUN 50*  < > 48* 46* 44*  CREATININE 4.62*  < > 3.96* 3.58* 3.47*  CALCIUM 10.1  < > 8.8 9.0 8.9  MG 1.8  --   --   --   --   PHOS 3.4  --   --   --   --   GLUCOSE 126*  < > 105* 101* 103*  < > = values in this interval not displayed.  CBG (last 3)   Recent Labs  07/28/14 0846 07/29/14 0742  GLUCAP 132* 101*    Scheduled Meds: . cycloSPORINE  1 drop Both Eyes BID  . enoxaparin (LOVENOX) injection  70 mg Subcutaneous Q24H  . lactose free nutrition  237 mL Oral TID BM  . piperacillin-tazobactam (ZOSYN)  IV  2.25 g Intravenous 3 times  per day    Continuous Infusions: . sodium chloride (hypertonic)      Past Medical History  Diagnosis Date  . Medical history non-contributory   . HDL deficiency   . Allergy     seasonal allergic rhinitis  . History of radiation therapy 10/16/13-11/27/13    rectal 50.4Gy  . Peripheral vascular disease 4/15    DVT LEFT LEG  . Cancer 09/19/13    rectum    Past Surgical History  Procedure Laterality Date  . Left leg surgery  1986    rod from hip to knee  . Colonoscopy with propofol N/A 09/19/2013    Procedure: COLONOSCOPY WITH PROPOFOL;  Surgeon: Garlan Fair, MD;  Location: [facility];  Service: Endoscopy;  Laterality: N/A;  . Femur fracture surgery Left 1985    steel rod  . Eus N/A 09/27/2013    Procedure: LOWER ENDOSCOPIC ULTRASOUND (EUS);  Surgeon: Arta Silence, MD;  Location: [facility];  Service: Endoscopy;  Laterality: N/A;  .  Laparoscopic low anterior resection N/A 01/12/2014    Procedure: LAPAROSCOPIC LOW ANTERIOR RESECTION ;  Surgeon: Leighton Ruff, MD;  Location: [facility];  Service: General;  Laterality: N/A;  . Ileo loop diversion N/A 01/12/2014    Procedure:   LOOP ILEOSTOMY;  Surgeon: Leighton Ruff, MD;  Location: [facility];  Service: General;  Laterality: N/A;  . Ileostomy closure N/A 06/07/2014    Procedure: ILEOSTOMY reversal ;  Surgeon: Leighton Ruff, MD;  Location: [facility];  Service: General;  Laterality: N/A;    Antonieta Iba, RD, LDN Clinical Inpatient Dietitian Pager:  [phone] Weekend and after hours pager:  [phone]

## 2014-07-30 ENCOUNTER — Encounter: Payer: Self-pay | Admitting: Specialist

## 2014-07-30 LAB — RENAL FUNCTION PANEL
Albumin: 2.1 g/dL — ABNORMAL LOW (ref 3.5–5.2)
Anion gap: 24 — ABNORMAL HIGH (ref 5–15)
BUN: 30 mg/dL — ABNORMAL HIGH (ref 6–23)
CALCIUM: 7.4 mg/dL — AB (ref 8.4–10.5)
CO2: 29 mEq/L (ref 19–32)
Chloride: 75 mEq/L — ABNORMAL LOW (ref 96–112)
Creatinine, Ser: 1.9 mg/dL — ABNORMAL HIGH (ref 0.50–1.10)
GFR calc Af Amer: 32 mL/min — ABNORMAL LOW (ref >90)
GFR, EST NON AFRICAN AMERICAN: 27 mL/min — AB (ref >90)
Glucose, Bld: 163 mg/dL — ABNORMAL HIGH (ref 70–99)
PHOSPHORUS: 2.1 mg/dL — AB (ref 2.3–4.6)
Potassium: 2.5 mEq/L — CL (ref 3.7–5.3)
SODIUM: 128 meq/L — AB (ref 137–147)

## 2014-07-30 LAB — BASIC METABOLIC PANEL
Anion gap: 13 (ref 5–15)
BUN: 39 mg/dL — ABNORMAL HIGH (ref 6–23)
CHLORIDE: 70 meq/L — AB (ref 96–112)
CO2: 38 mEq/L — ABNORMAL HIGH (ref 19–32)
Calcium: 8.6 mg/dL (ref 8.4–10.5)
Creatinine, Ser: 2.79 mg/dL — ABNORMAL HIGH (ref 0.50–1.10)
GFR calc Af Amer: 20 mL/min — ABNORMAL LOW (ref >90)
GFR calc non Af Amer: 17 mL/min — ABNORMAL LOW (ref >90)
Glucose, Bld: 87 mg/dL (ref 70–99)
POTASSIUM: 2.7 meq/L — AB (ref 3.7–5.3)
Sodium: 121 mEq/L — CL (ref 137–147)

## 2014-07-30 LAB — GLUCOSE, CAPILLARY: Glucose-Capillary: 81 mg/dL (ref 70–99)

## 2014-07-30 MED ORDER — MORPHINE SULFATE 2 MG/ML IJ SOLN
2.0000 mg | INTRAMUSCULAR | Status: DC | PRN
  Administered 2014-07-30 – 2014-07-31 (×3): 2 mg via INTRAVENOUS
  Filled 2014-07-30 (×3): qty 1

## 2014-07-30 MED ORDER — POTASSIUM CHLORIDE 10 MEQ/100ML IV SOLN
10.0000 meq | INTRAVENOUS | Status: AC
  Administered 2014-07-30 (×4): 10 meq via INTRAVENOUS
  Filled 2014-07-30 (×4): qty 100

## 2014-07-30 MED ORDER — POTASSIUM CHLORIDE 10 MEQ/100ML IV SOLN
10.0000 meq | INTRAVENOUS | Status: AC
  Administered 2014-07-30 (×5): 10 meq via INTRAVENOUS
  Filled 2014-07-30 (×5): qty 100

## 2014-07-30 MED ORDER — SODIUM CHLORIDE 0.9 % IV SOLN
INTRAVENOUS | Status: DC
  Administered 2014-07-30 – 2014-08-02 (×8): via INTRAVENOUS
  Filled 2014-07-30 (×10): qty 1000

## 2014-07-30 NOTE — Progress Notes (Signed)
CRITICAL VALUE ALERT  Critical value received:  K 2.5  Date of notification:  07/30/14  Time of notification:  3545  Critical value read back:Yes.    Nurse who received alert:  Ckeatts,rn    MD notified (1st page): Dr. Charlies Silvers  Time of first page:  1510  MD notified (2nd page):  Time of second page:  Responding MD:  Dr. Charlies Silvers  Time MD responded:  [phone]

## 2014-07-30 NOTE — Progress Notes (Signed)
ANTIBIOTIC CONSULT NOTE   Pharmacy Consult for zosyn Indication: cholecystitis  Allergies  Allergen Reactions  . Ointment Base [Lanolin-Petrolatum] Rash    Specifically burn ointments.    Patient Measurements: Height: 5\' 5"  (165.1 cm) Weight: 147 lb 14.9 oz (67.1 kg) IBW/kg (Calculated) : 57 Adjusted Body Weight:   Vital Signs: Temp: 97.6 F (36.4 C) (08/31 0231) Temp src: Oral (08/31 0231) BP: 131/87 mmHg (08/31 0600) Pulse Rate: 83 (08/31 0600) Intake/Output from previous day: 08/30 0701 - 08/31 0700 In: 2465 [I.V.:1425; IV Piggyback:1000] Out: 5400 [Urine:3025; Emesis/NG output:3350] Intake/Output from this shift:    Labs:  Recent Labs  07/27/14 1849  07/28/14 0624  07/29/14 1157 07/29/14 1940 07/30/14 0215  WBC 7.5  --  7.5  --   --   --   --   HGB 11.1*  --  10.6*  --   --   --   --   PLT 467*  --  411*  --   --   --   --   CREATININE 4.62*  < > 5.12*  < > 3.47* 3.14* 2.79*  < > = values in this interval not displayed. Estimated Creatinine Clearance: 18.8 ml/min (by C-G formula based on Cr of 2.79). No results found for this basename: VANCOTROUGH, Corlis Leak, VANCORANDOM, [place], GENTPEAK, GENTRANDOM, TOBRATROUGH, TOBRAPEAK, TOBRARND, AMIKACINPEAK, AMIKACINTROU, AMIKACIN,  in the last 72 hours   Microbiology: Recent Results (from the past 720 hour(s))  URINE CULTURE     Status: None   Collection Time    07/11/14  5:39 PM      Result Value Ref Range Status   Specimen Description URINE, CLEAN CATCH   Final   Special Requests NONE   Final   Culture  Setup Time     Final   Value: 07/11/2014 22:20     Performed at [facility]     Final   Value: 50,000 COLONIES/ML     Performed at [facility]   Culture     Final   Value: Multiple bacterial morphotypes present, none predominant. Suggest appropriate recollection if clinically indicated.     Performed at [facility]   Report Status 07/12/2014 FINAL   Final   CULTURE, BLOOD (ROUTINE X 2)     Status: None   Collection Time    07/11/14  9:22 PM      Result Value Ref Range Status   Specimen Description BLOOD RIGHT ARM   Final   Special Requests BOTTLES DRAWN AEROBIC AND ANAEROBIC 5CC   Final   Culture  Setup Time     Final   Value: 07/12/2014 00:30     Performed at [facility]   Culture     Final   Value: NO GROWTH 5 DAYS     Performed at [facility]   Report Status 07/18/2014 FINAL   Final  CULTURE, BLOOD (ROUTINE X 2)     Status: None   Collection Time    07/11/14  9:22 PM      Result Value Ref Range Status   Specimen Description BLOOD RIGHT HAND   Final   Special Requests BOTTLES DRAWN AEROBIC AND ANAEROBIC 5CC   Final   Culture  Setup Time     Final   Value: 07/12/2014 00:30     Performed at [facility]   Culture     Final   Value: NO GROWTH 5 DAYS  Performed at [facility]   Report Status 07/18/2014 FINAL   Final  MRSA PCR SCREENING     Status: None   Collection Time    07/29/14  9:33 AM      Result Value Ref Range Status   MRSA by PCR NEGATIVE  NEGATIVE Final   Comment:            The GeneXpert MRSA Assay (FDA     approved for NASAL specimens     only), is one component of a     comprehensive MRSA colonization     surveillance program. It is not     intended to diagnose MRSA     infection nor to guide or     monitor treatment for     MRSA infections.   Assessment: 75 YOF history of rectal cancer admitted from [facility] with failure to thrive, dehydration and possible cholecystitis. Abdominal US reveals gallstone lodged in GB neck and likely cholecystitis.  [place] also reveals liver lesions  Note to have ARI at time of admission. Pharmacy consulted to dose zosyn.  8/28 >> zosyn  >>  Tmax: afebrile WBCs: WNL Renal: ARF - improving, SCr = 2.79, CrCl < 57ml/min.  UOP improving  No cultures  Goal of Therapy:  Dose for renal function and indication  Plan:  Day #3 zosyn  Increase Zosyn  2.25gm IV q6h   Monitor renal function and adjust accordingly  Doreene Eland, PharmD, BCPS.   Pager: [phone]  07/30/2014,7:29 AM

## 2014-07-30 NOTE — Progress Notes (Signed)
Subjective: Interval History: has complaints doesn't feel good.  Objective: Vital signs in last 24 hours: Temp:  [97.6 F (36.4 C)-98.7 F (37.1 C)] 97.9 F (36.6 C) (08/31 0800) Pulse Rate:  [78-96] 80 (08/31 0800) Resp:  [20-33] 27 (08/31 0800) BP: (129-153)/(71-87) 144/85 mmHg (08/31 0800) SpO2:  [92 %-100 %] 99 % (08/31 0800) Weight:  [67.1 kg (147 lb 14.9 oz)] 67.1 kg (147 lb 14.9 oz) (08/31 0116) Weight change: 2.574 kg (5 lb 10.8 oz)  Intake/Output from previous day: 08/30 0701 - 08/31 0700 In: 2590 [I.V.:1550; IV Piggyback:1000] Out: 6375 [Urine:3025; Emesis/NG output:3350] Intake/Output this shift: Total I/O In: 500 [I.V.:500] Out: -   General appearance: cooperative, moderate distress and mildly obese Resp: diminished breath sounds bilaterally Cardio: S1, S2 normal GI: no bs, diffusely tender Extremities: edema 1+  Lab Results:  Recent Labs  07/27/14 1849 07/28/14 0624  WBC 7.5 7.5  HGB 11.1* 10.6*  HCT 31.2* 29.8*  PLT 467* 411*   BMET:  Recent Labs  07/29/14 1940 07/30/14 0215  NA 121* 121*  K 3.2* 2.7*  CL 69* 70*  CO2 37* 38*  GLUCOSE 85 87  BUN 41* 39*  CREATININE 3.14* 2.79*  CALCIUM 8.9 8.6   No results found for this basename: PTH,  in the last 72 hours Iron Studies: No results found for this basename: IRON, TIBC, TRANSFERRIN, FERRITIN,  in the last 72 hours  Studies/Results: Dg Abd Portable 1v  07/29/2014   CLINICAL DATA:  63 year old female with abdominal distention. Initial encounter.  EXAM: PORTABLE ABDOMEN - 1 VIEW  COMPARISON:  Abdomen ultrasound 07/28/2014. CT Abdomen and Pelvis 07/18/2014.  FINDINGS: Portable AP supine view at 1000 hrs. Moderate to severe gaseous distension of the stomach. Oral contrast in nondilated right colon. Residual mildly dilated gas-filled small bowel loops in the left abdomen. Stable visualized osseous structures.  IMPRESSION: 1. Abdominal distention appears do and large part to gaseous distension of the  stomach. Consider NG tube decompression. 2. Oral contrast in nondilated right colon. Similar mildly dilated small bowel loops in the left abdomen. See also CT Abdomen and Pelvis report 07/18/2014.   Electronically Signed   By: Victoria [name] M.D.   On: 07/29/2014 10:09    I have reviewed the patient's current medications.  Assessment/Plan: 1 AKI reolving slowly 2 Hyponatremia appropriate ADH , low value with xs water retention.  Needs NaCl.   3 Alkalemia needs k and Cl. 4 Ileus, ? SBO. 5 Rectal Ca 6 malnutrition P KCl, NaCl, ivf, follow chem, w/u bowel    LOS: 3 days   Victoria [name] L 07/30/2014,12:19 PM

## 2014-07-30 NOTE — Progress Notes (Signed)
Victoria [name] for Lovenox Indication: History of DVT  Allergies  Allergen Reactions  . Ointment Base [Lanolin-Petrolatum] Rash    Specifically burn ointments.    Patient Measurements: Height: 5\' 5"  (165.1 cm) Weight: 147 lb 14.9 oz (67.1 kg) IBW/kg (Calculated) : 57 Heparin Dosing Weight:   Vital Signs: Temp: 97.6 F (36.4 C) (08/31 0231) Temp src: Oral (08/31 0231) BP: 131/87 mmHg (08/31 0600) Pulse Rate: 83 (08/31 0600)  Labs:  Recent Labs  07/27/14 1849  07/28/14 0624  07/29/14 1157 07/29/14 1940 07/30/14 0215  HGB 11.1*  --  10.6*  --   --   --   --   HCT 31.2*  --  29.8*  --   --   --   --   PLT 467*  --  411*  --   --   --   --   APTT 28  --   --   --   --   --   --   LABPROT 15.1  --   --   --   --   --   --   INR 1.19  --   --   --   --   --   --   CREATININE 4.62*  < > 5.12*  < > 3.47* 3.14* 2.79*  < > = values in this interval not displayed.  Estimated Creatinine Clearance: 18.8 ml/min (by C-G formula based on Cr of 2.79).   Medical History: Past Medical History  Diagnosis Date  . Medical history non-contributory   . HDL deficiency   . Allergy     seasonal allergic rhinitis  . History of radiation therapy 10/16/13-11/27/13    rectal 50.4Gy  . Peripheral vascular disease 4/15    DVT LEFT LEG  . Cancer 09/19/13    rectum    Medications:  Scheduled:  . antiseptic oral rinse  7 mL Mouth Rinse q12n4p  . chlorhexidine  15 mL Mouth Rinse BID  . cycloSPORINE  1 drop Both Eyes BID  . enoxaparin (LOVENOX) injection  70 mg Subcutaneous Q24H  . lactose free nutrition  237 mL Oral TID BM  . piperacillin-tazobactam (ZOSYN)  IV  2.25 g Intravenous 3 times per day  . sodium chloride  10-40 mL Intracatheter Q12H   Infusions:  . sodium chloride 125 mL/hr at 07/29/14 2136   PRN: acetaminophen, acetaminophen, HYDROcodone-acetaminophen, ipratropium-albuterol, magnesium hydroxide, morphine injection, ondansetron (ZOFRAN)  IV, sodium chloride  Assessment: Pt with h/o rectal cancer and now with liver mets was admitted from cancer center for failure to thrive and dehydration. Pt was on Lovenox 100mg  SQ Q24h PTA for L leg DVT diagnosed  in 03/15/14 with last dose 8/27@1730 . Scr increased to 4.62 today. Lovenox to continue while in hospital.  8/28 20:30 LMW anti-Xa = 0.77 (therapeutic range 0.6-1) Last dose was 17:30 8/27   CBC stable on last check  SCr elevated but improving with est CrCl = 38ml/min  Goal of Therapy:  Anti-Xa level 0.6-1 units/ml 4hrs after LMWH dose given Monitor PLT with anticoagulation protocol: Yes   Plan:   Continue lovenox 70mg  SQ q24h for CrCl < 25ml/mim  CBC in am  Follow renal function and change back to enoxaparin 1.5mg /kg SQ q24h when CrCl > 79ml/min  Doreene Eland, PharmD, BCPS.   Pager: [phone]  07/30/2014,7:38 AM

## 2014-07-30 NOTE — Progress Notes (Addendum)
Patient ID: Victoria [name], female   DOB: Nov 30, 1951, 63 y.o.   MRN: [id number] [facility] PROGRESS NOTE  Victoria [name] XHB:[id number] DOB: [birth year] DOA: 07/27/2014 PCP: Irven Shelling, MD  Brief narrative: 63 year old female with history of rectal adenocarcinoma, status post radiation and chemotherapy, status post resection and diverting ileostomy in 12/2013, ileostomy reversal on 06/07/2014, history of DVT on anticoagulation with Lovenox, recent hospitalization for small bowel obstruction 07/11/14 through 07/14/2014 who presented from cancer center with nausea and vomiting, failure to thrive. Abdominal US showed possible acute cholecystitis for which she is on zosyn. Patient had recent CT scan done which among other things was significant for gallbladder wall thickening up to 11 mm in thickness. Her hospital course is complicated with hyponatremia and acute renal failure. Initial thought was that pt needed central line for hypertonic saline for which reason pt was transferred to SDU.  Sodium started trending up so hypertonic saline was not required.  Assessment & Plan    Principal Problem:  Acute cholecystitis  Acute cholecystitis noted on abdominal US. Pt started on empiric zosyn. NO significant pain on physical exam. Per surgery, may need drain if HIDA scan is positive otherwise no surgical intervention planned. Continue supportive care with IV fluids, antiemetics PRN. Active Problems:  Abdominal distention As mentioned above, based on abdominal x ray gaseous distention likely a cause. NG tube placed for decompression. Abdomen still distended. Rectal adenocarcinoma with liver metastases  Recent CT abdomen done 07/18/2014 with findings of new small right pleural effusion; stable 5 mm posterior right upper lobe lung nodule; gallstones noted within the gallbladder; marked gallbladder wall thickening measuring up to 11 mm in thickness at the fundus; abnormal low density noted  in the adjacent inferior liver with small scattered satellite nodules in the adjacent liver. Findings within the liver new since the prior study. Small amount of perihepatic fluid adjacent to the right hepatic lobe and gallbladder. Extensive edema/stranding within the pelvis extending from the presacral space anteriorly. New area of sclerosis right pubic bone.  Appreciate oncology following Acute renal failure / Contrast induced nephropathy Possibly contrast induced nephropathy  Creatinine starting to trend down, 3.47 --> 2.79 over past 72 hours. Appreciate nephrology team following.  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 Severe protein calorie malnutrition  Nutrition consulted.Has NG tube for abdominal distention.  DVT prophylaxis  On therapeutic anticoagulation with Lovenox   Code Status: Full.  Family Communication: plan of care discussed with the patient  Disposition Plan: may be transferred to medical floor; order placed; RN aware   IV Access:   Peripheral IV Procedures and diagnostic studies:    US Abdomen Complete 07/28/2014 1. Gallstones with 1 stone measuring 1.7 cm lodged within the gallbladder neck. Wall is thickened to 5.2 mm. Multiple echogenic foci project along the gallbladder wall. This could reflect a air. Findings support acute cholecystitis in the proper clinical setting. 2. Hypoechoic liver lesions which are nonspecific. They do not appear to be cysts. Largest lies in the right lobe measuring 15 mm. Liver shows a diffusely coarsened increased echotexture consistent with hepatic steatosis. 3. No bile duct dilation.   Abdominal X ray 07/29/2014 - abdominal distention  likely due to gaseous distention in stomach.  Consider NG tube for decompression.  Medical Consultants:   Oncology (Dr. Betsy Coder)  Nephrology (Dr. Roney Jaffe)  Surgery   Other Consultants:   Nutrition   Anti-Infectives:   Zosyn 07/27/2014 -->   Leisa Lenz, MD  [facility] Pager [phone]  If 7PM-7AM, please contact night-coverage [email or web address] Password [email or web address] 07/30/2014, 1:30 PM   LOS: 3 days    HPI/Subjective: No acute overnight events.  Objective: Filed Vitals:   07/30/14 0400 07/30/14 0500 07/30/14 0600 07/30/14 0800  BP: 141/75  131/87 144/85  Pulse: 80 80 83 80  Temp:    97.9 F (36.6 C)  TempSrc:    Oral  Resp: 28 25 24 27   Height:      Weight:      SpO2: 92% 99% 99% 99%    Intake/Output Summary (Last 24 hours) at 07/30/14 1330 Last data filed at 07/30/14 1300  Gross per 24 hour  Intake   3000 ml  Output   3000 ml  Net      0 ml    Exam:   General:  Pt is alert although little drowsy, no distress  Cardiovascular: Regular rate and rhythm, S1/S2, no murmurs  Respiratory: diminished breath sounds   Abdomen: mildly distended, non tender, bowel sounds present  Extremities: No edema, pulses DP and PT palpable bilaterally  Neuro: Grossly nonfocal  Data Reviewed: Basic Metabolic Panel:  Recent Labs Lab 07/27/14 1849  07/29/14 0148 07/29/14 0732 07/29/14 1157 07/29/14 1940 07/30/14 0215  NA 107*  < > 113* 114* 117* 121* 121*  K 4.3  < > 3.0* 2.7* 3.3* 3.2* 2.7*  CL <65*  < > <65* <65* <65* 69* 70*  CO2 38*  < > 36* 36* 36* 37* 38*  GLUCOSE 126*  < > 105* 101* 103* 85 87  BUN 50*  < > 48* 46* 44* 41* 39*  CREATININE 4.62*  < > 3.96* 3.58* 3.47* 3.14* 2.79*  CALCIUM 10.1  < > 8.8 9.0 8.9 8.9 8.6  MG 1.8  --   --   --   --   --   --   PHOS 3.4  --   --   --   --   --   --   < > = values in this interval not displayed. Liver Function Tests:  Recent Labs Lab 07/27/14 1849 07/28/14 0624  AST 32 36  ALT 29 30  ALKPHOS  103 94  BILITOT 0.5 0.6  PROT 9.0* 8.1  ALBUMIN 3.8 3.4*   No results found for this basename: LIPASE, AMYLASE,  in the last 168 hours No results found for this basename: AMMONIA,  in the last 168 hours CBC:  Recent Labs Lab 07/27/14 1849 07/28/14 0624  WBC 7.5 7.5  NEUTROABS 6.4  --   HGB 11.1* 10.6*  HCT 31.2* 29.8*  MCV 75.2* 75.1*  PLT 467* 411*   Cardiac Enzymes: No results found for this basename: CKTOTAL, CKMB, CKMBINDEX, TROPONINI,  in the last 168 hours BNP: No components found with this basename: POCBNP,  CBG:  Recent Labs Lab 07/28/14 0846 07/29/14 0742 07/30/14 0824  GLUCAP 132* 101* 81    Recent Results (from the past 240 hour(s))  MRSA PCR SCREENING     Status: None   Collection Time    07/29/14  9:33 AM      Result Value Ref Range Status   MRSA by PCR NEGATIVE  NEGATIVE Final     Scheduled Meds: . cycloSPORINE  1 drop Both Eyes BID  . enoxaparin (LOVENOX) injection  70 mg Subcutaneous Q24H  . lactose free nutrition  237 mL Oral TID BM  . piperacillin-tazobactam (ZOSYN)  IV  2.25 g Intravenous 3 times per day  . sodium chloride  10-40 mL Intracatheter Q12H   Continuous Infusions: . sodium chloride 125 mL/hr at 07/29/14 2136

## 2014-07-30 NOTE — Progress Notes (Signed)
Patient was transferred from ICU to room 1528.

## 2014-07-30 NOTE — Progress Notes (Signed)
Reviewed pt's chart.  Was asked by Dr Benay Spice to eval for possible cholecystitis.  See previous office not for details.  RUQ US shows thickened GB wall.  LFT's WNL.  No elevation in WBC.  Obviously her metabolic issues take precedence, but could do HIDA if concerned for acute cholecystitis.  She would not be operative candidate but could get IR drain if HIDA positive.

## 2014-07-30 NOTE — Clinical Documentation Improvement (Addendum)
"  Acute Renal Failure" documented in current record.  "Possible Contrast Induced Nephropathy" also documented in current record.  Recent history of CT Abdomen 07/18/14 with 100 cc of IV contrast.  If possible, please document the suspected, possible or known cause of the patient's Acute Renal Failure:   - Acute Tubular Necrosis 2/2 ....................................   - Other Cause   - Unable to Clinically Determine  Thank You, Erling Conte ,RN Clinical Documentation Specialist:  [phone] [facility] [employer]  I have documented acute renal failure secondary to contrast induced nephropathy. Let me know if this suffices. Leisa Lenz

## 2014-07-30 NOTE — Progress Notes (Signed)
Visited with patient while she was in [place] ICU.  She told me how she had been in pain but had been given pain medication that was working well. Offered support and explained services offered through the [facility] support center.   Presence; listening.  Epifania Gore, PhD, [place]

## 2014-07-31 ENCOUNTER — Inpatient Hospital Stay (HOSPITAL_COMMUNITY): Payer: BC Managed Care – PPO

## 2014-07-31 LAB — COMPREHENSIVE METABOLIC PANEL
ALBUMIN: 2.5 g/dL — AB (ref 3.5–5.2)
ALK PHOS: 76 U/L (ref 39–117)
ALT: 15 U/L (ref 0–35)
AST: 19 U/L (ref 0–37)
Anion gap: 17 — ABNORMAL HIGH (ref 5–15)
BUN: 26 mg/dL — ABNORMAL HIGH (ref 6–23)
CALCIUM: 8.7 mg/dL (ref 8.4–10.5)
CO2: 31 mEq/L (ref 19–32)
Chloride: 84 mEq/L — ABNORMAL LOW (ref 96–112)
Creatinine, Ser: 1.71 mg/dL — ABNORMAL HIGH (ref 0.50–1.10)
GFR calc non Af Amer: 31 mL/min — ABNORMAL LOW (ref >90)
GFR, EST AFRICAN AMERICAN: 36 mL/min — AB (ref >90)
Glucose, Bld: 80 mg/dL (ref 70–99)
POTASSIUM: 3.8 meq/L (ref 3.7–5.3)
Sodium: 132 mEq/L — ABNORMAL LOW (ref 137–147)
TOTAL PROTEIN: 6.9 g/dL (ref 6.0–8.3)
Total Bilirubin: 0.4 mg/dL (ref 0.3–1.2)

## 2014-07-31 LAB — CBC
HCT: 30.8 % — ABNORMAL LOW (ref 36.0–46.0)
Hemoglobin: 9.9 g/dL — ABNORMAL LOW (ref 12.0–15.0)
MCH: 25.9 pg — AB (ref 26.0–34.0)
MCHC: 32.1 g/dL (ref 30.0–36.0)
MCV: 80.6 fL (ref 78.0–100.0)
PLATELETS: 376 10*3/uL (ref 150–400)
RBC: 3.82 MIL/uL — ABNORMAL LOW (ref 3.87–5.11)
RDW: 15.1 % (ref 11.5–15.5)
WBC: 8.5 10*3/uL (ref 4.0–10.5)

## 2014-07-31 LAB — GLUCOSE, CAPILLARY: Glucose-Capillary: 78 mg/dL (ref 70–99)

## 2014-07-31 MED ORDER — SIMETHICONE 80 MG PO CHEW
80.0000 mg | CHEWABLE_TABLET | Freq: Four times a day (QID) | ORAL | Status: DC | PRN
  Filled 2014-07-31: qty 1

## 2014-07-31 MED ORDER — ENOXAPARIN SODIUM 100 MG/ML ~~LOC~~ SOLN
100.0000 mg | SUBCUTANEOUS | Status: DC
  Filled 2014-07-31: qty 1

## 2014-07-31 MED ORDER — DOCUSATE SODIUM 100 MG PO CAPS
100.0000 mg | ORAL_CAPSULE | Freq: Two times a day (BID) | ORAL | Status: DC
  Administered 2014-08-01 – 2014-08-03 (×6): 100 mg via ORAL
  Filled 2014-07-31 (×10): qty 1

## 2014-07-31 MED ORDER — ENOXAPARIN SODIUM 100 MG/ML ~~LOC~~ SOLN
100.0000 mg | SUBCUTANEOUS | Status: DC
Start: 2014-07-31 — End: 2014-08-03
  Administered 2014-07-31 – 2014-08-02 (×3): 100 mg via SUBCUTANEOUS
  Filled 2014-07-31 (×3): qty 1

## 2014-07-31 MED ORDER — PIPERACILLIN-TAZOBACTAM 3.375 G IVPB
3.3750 g | Freq: Three times a day (TID) | INTRAVENOUS | Status: DC
  Administered 2014-07-31 – 2014-08-04 (×13): 3.375 g via INTRAVENOUS
  Filled 2014-07-31 (×16): qty 50

## 2014-07-31 MED ORDER — POLYETHYLENE GLYCOL 3350 17 G PO PACK
17.0000 g | PACK | Freq: Two times a day (BID) | ORAL | Status: DC
  Administered 2014-08-01 – 2014-08-03 (×6): 17 g via ORAL
  Filled 2014-07-31 (×10): qty 1

## 2014-07-31 NOTE — Progress Notes (Signed)
Subjective: Interval History: has complaints bowels not working, .  Objective: Vital signs in last 24 hours: Temp:  [97.5 F (36.4 C)-98.4 F (36.9 C)] 97.6 F (36.4 C) (09/01 0549) Pulse Rate:  [80-89] 89 (09/01 1010) Resp:  [22-28] 25 (09/01 1010) BP: (119-146)/(77-88) 145/88 mmHg (09/01 1010) SpO2:  [97 %-99 %] 98 % (09/01 1010) Weight:  [69.882 kg (154 lb 1 oz)] 69.882 kg (154 lb 1 oz) (09/01 0500) Weight change: 0.709 kg (1 lb 9 oz)  Intake/Output from previous day: 08/31 0701 - 09/01 0700 In: 3266.7 [I.V.:2666.7; IV Piggyback:600] Out: 3500 [Urine:1875; Emesis/NG output:1600] Intake/Output this shift: Total I/O In: 0  Out: 750 [Urine:400; Emesis/NG output:350]  General appearance: cooperative, pale and slowed mentation Resp: clear to auscultation bilaterally Cardio: S1, S2 normal and systolic murmur: holosystolic 2/6, blowing at apex GI: no bs, mod distension, NG in place Extremities: edema tr  Lab Results:  Recent Labs  07/31/14 0442  WBC 8.5  HGB 9.9*  HCT 30.8*  PLT 376   BMET:  Recent Labs  07/30/14 1420 07/31/14 0442  NA 128* 132*  K 2.5* 3.8  CL 75* 84*  CO2 29 31  GLUCOSE 163* 80  BUN 30* 26*  CREATININE 1.90* 1.71*  CALCIUM 7.4* 8.7   No results found for this basename: PTH,  in the last 72 hours Iron Studies: No results found for this basename: IRON, TIBC, TRANSFERRIN, FERRITIN,  in the last 72 hours  Studies/Results: No results found.  I have reviewed the patient's current medications.  Assessment/Plan: 1  AKI resolving 2 Hyponatremia resolving with NaCl repletion. 3 low K replete 4 Alkalosis resolving with Cl repletion 5 SBO  This is primary issue.  Not clear this is GB P IV NS, k as needed.    Will s/o for now and see again at your request    LOS: 4 days   Shamecca Whitebread L 07/31/2014,11:57 AM

## 2014-07-31 NOTE — Progress Notes (Signed)
ANTIBIOTIC CONSULT NOTE   Pharmacy Consult for zosyn Indication: cholecystitis  Allergies  Allergen Reactions  . Ointment Base [Lanolin-Petrolatum] Rash    Specifically burn ointments.    Patient Measurements: Height: 5\' 5"  (165.1 cm) Weight: 154 lb 1 oz (69.882 kg) IBW/kg (Calculated) : 57 Adjusted Body Weight:   Vital Signs: Temp: 97.6 F (36.4 C) (09/01 0549) Temp src: Oral (09/01 0549) BP: 145/88 mmHg (09/01 1010) Pulse Rate: 89 (09/01 1010) Intake/Output from previous day: 08/31 0701 - 09/01 0700 In: 3266.7 [I.V.:2666.7; IV Piggyback:600] Out: 7591 [Urine:1875; Emesis/NG output:1600] Intake/Output from this shift: Total I/O In: 0  Out: 750 [Urine:400; Emesis/NG output:350]  Labs:  Recent Labs  07/30/14 0215 07/30/14 1420 07/31/14 0442  WBC  --   --  8.5  HGB  --   --  9.9*  PLT  --   --  376  CREATININE 2.79* 1.90* 1.71*   Estimated Creatinine Clearance: 33.5 ml/min (by C-G formula based on Cr of 1.71). No results found for this basename: VANCOTROUGH, Corlis Leak, VANCORANDOM, [place], GENTPEAK, GENTRANDOM, TOBRATROUGH, TOBRAPEAK, TOBRARND, AMIKACINPEAK, AMIKACINTROU, AMIKACIN,  in the last 72 hours   Microbiology: Recent Results (from the past 720 hour(s))  URINE CULTURE     Status: None   Collection Time    07/11/14  5:39 PM      Result Value Ref Range Status   Specimen Description URINE, CLEAN CATCH   Final   Special Requests NONE   Final   Culture  Setup Time     Final   Value: 07/11/2014 22:20     Performed at [facility]     Final   Value: 50,000 COLONIES/ML     Performed at [facility]   Culture     Final   Value: Multiple bacterial morphotypes present, none predominant. Suggest appropriate recollection if clinically indicated.     Performed at [facility]   Report Status 07/12/2014 FINAL   Final  CULTURE, BLOOD (ROUTINE X 2)     Status: None   Collection Time    07/11/14  9:22 PM      Result  Value Ref Range Status   Specimen Description BLOOD RIGHT ARM   Final   Special Requests BOTTLES DRAWN AEROBIC AND ANAEROBIC 5CC   Final   Culture  Setup Time     Final   Value: 07/12/2014 00:30     Performed at [facility]   Culture     Final   Value: NO GROWTH 5 DAYS     Performed at [facility]   Report Status 07/18/2014 FINAL   Final  CULTURE, BLOOD (ROUTINE X 2)     Status: None   Collection Time    07/11/14  9:22 PM      Result Value Ref Range Status   Specimen Description BLOOD RIGHT HAND   Final   Special Requests BOTTLES DRAWN AEROBIC AND ANAEROBIC 5CC   Final   Culture  Setup Time     Final   Value: 07/12/2014 00:30     Performed at [facility]   Culture     Final   Value: NO GROWTH 5 DAYS     Performed at [facility]   Report Status 07/18/2014 FINAL   Final  MRSA PCR SCREENING     Status: None   Collection Time    07/29/14  9:33 AM      Result Value Ref Range  Status   MRSA by PCR NEGATIVE  NEGATIVE Final   Comment:            The GeneXpert MRSA Assay (FDA     approved for NASAL specimens     only), is one component of a     comprehensive MRSA colonization     surveillance program. It is not     intended to diagnose MRSA     infection nor to guide or     monitor treatment for     MRSA infections.   Assessment: 22 YOF history of rectal cancer admitted from [facility] with failure to thrive, dehydration and possible cholecystitis. Abdominal US reveals gallstone lodged in GB neck and likely cholecystitis.  [place] also reveals liver lesions  Note to have ARI at time of admission. Pharmacy consulted to dose zosyn.  8/28 >> zosyn  >>  Tmax: afebrile WBCs: WNL Renal: ARF -resolving, today CrCl 34 ml/min  No cultures  Goal of Therapy:  Dose for renal function and indication  Plan:  Day #4 zosyn  Change to Zosyn 3.375g IV q8h (infuse over 4 hours) for improved renal fxn  F/u SCr, clinical course  Ralene Bathe, PharmD,  BCPS 07/31/2014, 11:51 AM  Pager: [phone]

## 2014-07-31 NOTE — Progress Notes (Signed)
[facility] for Lovenox Indication: History of DVT  Allergies  Allergen Reactions  . Ointment Base [Lanolin-Petrolatum] Rash    Specifically burn ointments.    Patient Measurements: Height: 5\' 5"  (165.1 cm) Weight: 154 lb 1 oz (69.882 kg) IBW/kg (Calculated) : 57 Heparin Dosing Weight:   Vital Signs: Temp: 97.6 F (36.4 C) (09/01 0549) Temp src: Oral (09/01 0549) BP: 145/88 mmHg (09/01 1010) Pulse Rate: 89 (09/01 1010)  Labs:  Recent Labs  07/30/14 0215 07/30/14 1420 07/31/14 0442  HGB  --   --  9.9*  HCT  --   --  30.8*  PLT  --   --  376  CREATININE 2.79* 1.90* 1.71*    Estimated Creatinine Clearance: 33.5 ml/min (by C-G formula based on Cr of 1.71).   Medical History: Past Medical History  Diagnosis Date  . Medical history non-contributory   . HDL deficiency   . Allergy     seasonal allergic rhinitis  . History of radiation therapy 10/16/13-11/27/13    rectal 50.4Gy  . Peripheral vascular disease 4/15    DVT LEFT LEG  . Cancer 09/19/13    rectum    Medications:  Scheduled:  . antiseptic oral rinse  7 mL Mouth Rinse q12n4p  . chlorhexidine  15 mL Mouth Rinse BID  . cycloSPORINE  1 drop Both Eyes BID  . docusate sodium  100 mg Oral BID  . enoxaparin (LOVENOX) injection  70 mg Subcutaneous Q24H  . lactose free nutrition  237 mL Oral TID BM  . piperacillin-tazobactam (ZOSYN)  IV  2.25 g Intravenous 3 times per day  . polyethylene glycol  17 g Oral BID  . sodium chloride  10-40 mL Intracatheter Q12H   Infusions:  . sodium chloride 0.9 % 1,000 mL with potassium chloride 20 mEq infusion 125 mL/hr at 07/31/14 0942   PRN: acetaminophen, acetaminophen, HYDROcodone-acetaminophen, ipratropium-albuterol, magnesium hydroxide, morphine injection, ondansetron (ZOFRAN) IV, simethicone, sodium chloride  Assessment: Pt with h/o rectal cancer and now with liver mets was admitted from cancer center for failure to thrive and  dehydration. Pt was on Lovenox 100mg  SQ Q24h PTA for L leg DVT diagnosed  in 03/15/14 with last dose 8/27@1730 . Scr increased to 4.62 today. Lovenox to continue while in hospital.  8/28 20:30 LMW anti-Xa = 0.77 (therapeutic range 0.6-1) Last dose was 17:30 8/27  9/1  CBC stable on last check  SCr continues improving with est CrCl ~34 ml/min  Goal of Therapy:  Anti-Xa level 0.6-1 units/ml 4hrs after LMWH dose given Monitor PLT with anticoagulation protocol: Yes   Plan:   Adjust to lovenox 1.5mg /kg SQ q24h now that CrCl >30 ml/mim  Follow renal function, CBC   Ralene Bathe, PharmD, BCPS 07/31/2014, 11:43 AM  Pager: [phone]

## 2014-07-31 NOTE — Care Management Note (Addendum)
    Page 1 of 2   08/08/2014     1:01:54 PM CARE MANAGEMENT NOTE 08/08/2014  Patient:  Victoria [name], Victoria [name]   Account Number:  [id number]  Date Initiated:  07/31/2014  Documentation initiated by:  [facility]  Subjective/Objective Assessment:   63 Y/O F ADMITTED W/FTT.UT:MLYYTK CA W/LIVER METS.     Action/Plan:   Home with hospice services.   Anticipated DC Date:  08/11/2014   Anticipated DC Plan:  [place]  CM consult      Choice offered to / List presented to:  C-1 Patient           [place]   Status of service:  In process, will continue to follow Medicare Important Message given?   (If response is "NO", the following Medicare IM given date fields will be blank) Date Medicare IM given:   Medicare IM given by:   Date Additional Medicare IM given:   Additional Medicare IM given by:    Discharge Disposition:    Per UR Regulation:  Reviewed for med. necessity/level of care/duration of stay  If discussed at [place] of Stay Meetings, dates discussed:    Comments:  08/08/14 Allene Dillon RN BSN [phone] Met with pt and spouse to discuss home with hospice services. They gave me permission to make a referral to [facility]osp[facility] and [facility]. Husband stated that all their bedrooms are upstairs and he is not sure if she will be able to climb stairs at d/c or if he needs to make a bedroom for her downstairs. He however stated that PT will be woking with her today and will assess her. He is not sure what equipment might be needed but will discuss that with the hospice rep. Referral made to [facility] with [place]. CM will continue to follow pt.   08/07/14 Marney Doctor RN,BSN,NCM Order written for CM for Home hospice referral.  MD note (Dr. Benay Spice) mentions home with [facility].  Pt states that she would like CM to come back tomorrow when her husband is here to talk to them both about the  home hospice referral.  CM to speak with pt and husband tomorrow 08/08/14.  [id number] Davis,RN,BSN,CCM: Patient to be moved from sdu to reg floor [id number]/plan at this time is home with hospice once medical stable/continues to have ng tube, strict i&o,and ileus.  07/31/14 KATHY MAHABIR RN,BSN NCM [phone] ACUTE CHOLECYSTITIS.SX FOLLOWING.NEPHROLOGY-AKI IMPROVING.

## 2014-07-31 NOTE — Progress Notes (Addendum)
Patient ID: Victoria [name], female   DOB: 03-21-1951, 63 y.o.   MRN: [id number] [facility] PROGRESS NOTE  Victoria [name] [id number] DOB: 07/01/1951 DOA: 07/27/2014 PCP: Irven Shelling, MD  Brief narrative: 63 year old female with history of rectal adenocarcinoma, status post radiation and chemotherapy, status post resection and diverting ileostomy in 12/2013, ileostomy reversal on 06/07/2014, history of DVT on anticoagulation with Lovenox, recent hospitalization for small bowel obstruction 07/11/14 through 07/14/2014 who presented from cancer center with nausea and vomiting, failure to thrive. Abdominal US showed possible acute cholecystitis for which she is on zosyn. Patient had recent CT scan done which among other things was significant for gallbladder wall thickening up to 11 mm in thickness. Her hospital course is complicated with hyponatremia and acute renal failure. Initial thought was that pt needed central line for hypertonic saline for which reason pt was transferred to SDU. Sodium started trending up so hypertonic saline was not required.   Assessment & Plan   Principal Problem:  Acute cholecystitis  Acute cholecystitis noted on abdominal US. Pt started on empiric zosyn. No significant pain on physical exam. Per surgery, may need drain if pt were to have HIDA scan and HIDA scan is positive otherwise no surgical intervention planned.  Continue supportive care with IV fluids, antiemetics PRN. Active Problems:  Abdominal distention   As mentioned above, based on abdominal x ray gaseous distention likely a cause. NG tube placed for decompression. Abdomen still distended.  Repeat abdominal x-ray today to evaluate for resolution of gaseous distention. Added Colace, MiraLAX, simethicone to see if there will be any improvement. Rectal adenocarcinoma with liver metastases  Recent CT abdomen done 07/18/2014 with findings of new small right pleural effusion; stable 5 mm  posterior right upper lobe lung nodule; gallstones noted within the gallbladder; marked gallbladder wall thickening measuring up to 11 mm in thickness at the fundus; abnormal low density noted in the adjacent inferior liver with small scattered satellite nodules in the adjacent liver. Findings within the liver new since the prior study. Small amount of perihepatic fluid adjacent to the right hepatic lobe and gallbladder. Extensive edema/stranding within the pelvis extending from the presacral space anteriorly. New area of sclerosis right pubic bone.  Appreciate oncology following Acute renal failure / Contrast induced nephropathy  Possibly contrast induced nephropathy  Creatinine starting to trend down, 3.47 --> 2.79 --> 1.71  Appreciate nephrology team following.  Hypochloremic hyponatremia / metabolic alkalosis / hypokalemia  Likely dehydration, GI losses  Sodium trending up, 107 --> 110 --> 113 --> 121 --> 128 --> 132 Potassium repleted through IV fluids and it is WNL this am. Anemia of chronic disease  Likely due to history of malignancy. Hemoglobin is 9.9 this am. 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 Severe protein calorie malnutrition  Nutrition consulted.Has NG tube for abdominal distention.  DVT prophylaxis  On therapeutic anticoagulation with Lovenox   Code Status: Full.  Family Communication: plan of care discussed with the patient  Disposition Plan: home when stable   IV Access:   Peripheral IV Procedures and diagnostic studies:   US Abdomen Complete 07/28/2014 1. Gallstones with 1 stone measuring 1.7 cm lodged within the gallbladder neck. Wall is thickened to 5.2 mm. Multiple echogenic foci project along the gallbladder wall. This could reflect a air. Findings support acute cholecystitis in the proper clinical setting. 2. Hypoechoic liver lesions which are nonspecific. They do not appear to  be cysts.  Largest lies in the right lobe measuring 15 mm. Liver shows a diffusely coarsened increased echotexture consistent with hepatic steatosis. 3. No bile duct dilation.  Abdominal X ray 07/29/2014 - abdominal distention likely due to gaseous distention in stomach. Consider NG tube for decompression.  Medical Consultants:   Oncology (Dr. Betsy Coder)  Nephrology (Dr. Roney Jaffe)  Surgery   Other Consultants:   Nutrition   Anti-Infectives:   Zosyn 07/27/2014 -->    Leisa Lenz, MD  [facility] Pager [phone]  If 7PM-7AM, please contact night-coverage [email or web address] Password [email or web address] 07/31/2014, 7:03 AM   LOS: 4 days    HPI/Subjective: No acute overnight events.  Objective: Filed Vitals:   07/30/14 2018 07/30/14 2230 07/31/14 0500 07/31/14 0549  BP:  119/77  146/83  Pulse:  80  85  Temp: 97.7 F (36.5 C) 97.5 F (36.4 C)  97.6 F (36.4 C)  TempSrc: Oral Oral  Oral  Resp:  24  22  Height:      Weight:   69.882 kg (154 lb 1 oz)   SpO2:  97%  97%    Intake/Output Summary (Last 24 hours) at 07/31/14 0703 Last data filed at 07/31/14 0551  Gross per 24 hour  Intake 2160.42 ml  Output   3475 ml  Net -1314.58 ml    Exam:   General:  Pt is alert, follows commands appropriately, not in acute distress  Cardiovascular: Regular rate and rhythm, S1/S2 appreciated  Respiratory: Clear to auscultation bilaterally, no wheezing, no crackles, no rhonchi  Abdomen: Firm abdomen, non-tender, appreciate bowel sounds  Extremities: No edema, pulses DP and PT palpable bilaterally  Neuro: No focal neurologic deficits.  Data Reviewed: Basic Metabolic Panel:  Recent Labs Lab 07/27/14 1849  07/29/14 1157 07/29/14 1940 07/30/14 0215 07/30/14 1420 07/31/14 0442  NA 107*  < > 117* 121* 121* 128* 132*  K 4.3  < > 3.3* 3.2* 2.7* 2.5* 3.8  CL <65*  < > <65* 69* 70* 75* 84*  CO2 38*  < > 36* 37* 38* 29 31  GLUCOSE 126*  < > 103* 85 87 163* 80  BUN 50*  < > 44* 41* 39*  30* 26*  CREATININE 4.62*  < > 3.47* 3.14* 2.79* 1.90* 1.71*  CALCIUM 10.1  < > 8.9 8.9 8.6 7.4* 8.7  MG 1.8  --   --   --   --   --   --   PHOS 3.4  --   --   --   --  2.1*  --   < > = values in this interval not displayed. Liver Function Tests:  Recent Labs Lab 07/27/14 1849 07/28/14 0624 07/30/14 1420 07/31/14 0442  AST 32 36  --  19  ALT 29 30  --  15  ALKPHOS 103 94  --  76  BILITOT 0.5 0.6  --  0.4  PROT 9.0* 8.1  --  6.9  ALBUMIN 3.8 3.4* 2.1* 2.5*   No results found for this basename: LIPASE, AMYLASE,  in the last 168 hours No results found for this basename: AMMONIA,  in the last 168 hours CBC:  Recent Labs Lab 07/27/14 1849 07/28/14 0624 07/31/14 0442  WBC 7.5 7.5 8.5  NEUTROABS 6.4  --   --   HGB 11.1* 10.6* 9.9*  HCT 31.2* 29.8* 30.8*  MCV 75.2* 75.1* 80.6  PLT 467* 411* 376   Cardiac Enzymes: No results found for this basename: CKTOTAL, CKMB, CKMBINDEX, TROPONINI,  in the last 168 hours BNP: No components found with this basename: POCBNP,  CBG:  Recent Labs Lab 07/28/14 0846 07/29/14 0742 07/30/14 0824  GLUCAP 132* 101* 81    Recent Results (from the past 240 hour(s))  MRSA PCR SCREENING     Status: None   Collection Time    07/29/14  9:33 AM      Result Value Ref Range Status   MRSA by PCR NEGATIVE  NEGATIVE Final   Comment:            The GeneXpert MRSA Assay (FDA     approved for NASAL specimens     only), is one component of a     comprehensive MRSA colonization     surveillance program. It is not     intended to diagnose MRSA     infection nor to guide or     monitor treatment for     MRSA infections.     Scheduled Meds: . antiseptic oral rinse  7 mL Mouth Rinse q12n4p  . chlorhexidine  15 mL Mouth Rinse BID  . cycloSPORINE  1 drop Both Eyes BID  . enoxaparin (LOVENOX) injection  70 mg Subcutaneous Q24H  . lactose free nutrition  237 mL Oral TID BM  . piperacillin-tazobactam (ZOSYN)  IV  2.25 g Intravenous 3 times per day   . sodium chloride  10-40 mL Intracatheter Q12H   Continuous Infusions: . sodium chloride 0.9 % 1,000 mL with potassium chloride 20 mEq infusion 125 mL/hr at 07/30/14 2338

## 2014-08-01 DIAGNOSIS — K7689 Other specified diseases of liver: Secondary | ICD-10-CM

## 2014-08-01 LAB — CBC
HCT: 29.3 % — ABNORMAL LOW (ref 36.0–46.0)
HEMOGLOBIN: 9.8 g/dL — AB (ref 12.0–15.0)
MCH: 26.4 pg (ref 26.0–34.0)
MCHC: 33.4 g/dL (ref 30.0–36.0)
MCV: 79 fL (ref 78.0–100.0)
PLATELETS: 413 10*3/uL — AB (ref 150–400)
RBC: 3.71 MIL/uL — AB (ref 3.87–5.11)
RDW: 15.2 % (ref 11.5–15.5)
WBC: 9.1 10*3/uL (ref 4.0–10.5)

## 2014-08-01 LAB — RENAL FUNCTION PANEL
ANION GAP: 19 — AB (ref 5–15)
Albumin: 2.4 g/dL — ABNORMAL LOW (ref 3.5–5.2)
BUN: 19 mg/dL (ref 6–23)
CHLORIDE: 92 meq/L — AB (ref 96–112)
CO2: 27 meq/L (ref 19–32)
CREATININE: 1.22 mg/dL — AB (ref 0.50–1.10)
Calcium: 9.1 mg/dL (ref 8.4–10.5)
GFR calc Af Amer: 54 mL/min — ABNORMAL LOW (ref >90)
GFR, EST NON AFRICAN AMERICAN: 46 mL/min — AB (ref >90)
GLUCOSE: 94 mg/dL (ref 70–99)
Phosphorus: 1.7 mg/dL — ABNORMAL LOW (ref 2.3–4.6)
Potassium: 3.6 mEq/L — ABNORMAL LOW (ref 3.7–5.3)
Sodium: 138 mEq/L (ref 137–147)

## 2014-08-01 LAB — GLUCOSE, CAPILLARY: Glucose-Capillary: 92 mg/dL (ref 70–99)

## 2014-08-01 MED ORDER — HYDRALAZINE HCL 20 MG/ML IJ SOLN
10.0000 mg | Freq: Once | INTRAMUSCULAR | Status: AC
  Administered 2014-08-01: 10 mg via INTRAVENOUS
  Filled 2014-08-01: qty 1

## 2014-08-01 MED ORDER — POTASSIUM CHLORIDE CRYS ER 20 MEQ PO TBCR
40.0000 meq | EXTENDED_RELEASE_TABLET | Freq: Once | ORAL | Status: DC
  Filled 2014-08-01: qty 2

## 2014-08-01 NOTE — Progress Notes (Addendum)
Patient ID: Victoria [name], female   DOB: September 13, 1951, 63 y.o.   MRN: [id number] [facility] PROGRESS NOTE  Victoria [name] [id number] DOB: [birth year] DOA: 07/27/2014 PCP: Irven Shelling, MD  Brief narrative: 63 year old female with history of rectal adenocarcinoma, status post radiation and chemotherapy, status post resection and diverting ileostomy in 12/2013, ileostomy reversal on 06/07/2014, history of DVT on anticoagulation with Lovenox, recent hospitalization for small bowel obstruction 07/11/14 through 07/14/2014 who presented from cancer center with nausea and vomiting, failure to thrive. Abdominal US showed possible acute cholecystitis for which she is on zosyn. Patient had recent CT scan done which among other things was significant for gallbladder wall thickening up to 11 mm in thickness. Her hospital course is complicated with hyponatremia and acute renal failure. Initial thought was that pt needed central line for hypertonic saline for which reason pt was transferred to SDU. Sodium started trending up so hypertonic saline was not required. Renal function is improving as well.  Assessment & Plan    Principal Problem:  Acute cholecystitis  Acute cholecystitis noted on abdominal US. Pt started on empiric zosyn. No significant pain on physical exam.  Continue Zosyn for now. I think we can hold off on HIDA scan for now. Patient remains afebrile and white blood cell count is within normal limits. No surgical interventions planned at this time per surgery. Continue supportive care with IV fluids and antiemetics as needed.  Active Problems:  Abdominal distention  As mentioned above, based on abdominal x ray gaseous distention likely a cause. NG tube placed for decompression. Abdomen still distended. Repeat abdominal x-ray on 07/31/2014 showed gaseous distention. Patient still has NG tube. We will try sips of clear liquids for next 12 hours to see if she's tolerating  it. Continue Colace, MiraLAX, simethicone for bowel regimen. Rectal adenocarcinoma with liver metastases  Recent CT abdomen done 07/18/2014 with findings of new small right pleural effusion; stable 5 mm posterior right upper lobe lung nodule; gallstones noted within the gallbladder; marked gallbladder wall thickening measuring up to 11 mm in thickness at the fundus; abnormal low density noted in the adjacent inferior liver with small scattered satellite nodules in the adjacent liver. Findings within the liver new since the prior study. Small amount of perihepatic fluid adjacent to the right hepatic lobe and gallbladder. Extensive edema/stranding within the pelvis extending from the presacral space anteriorly. New area of sclerosis right pubic bone.  Appreciate oncology following Acute renal failure / Contrast induced nephropathy  Possibly contrast induced nephropathy  Creatinine starting to trend down, 3.47 --> 2.79 --> 1.71 --> 1.22 Appreciate nephrology team following.  Hypochloremic hyponatremia / metabolic alkalosis / hypokalemia  Likely dehydration, GI losses  Sodium trending up, 107 --> 110 --> 113 --> 121 --> 128 --> 132 --> 138 Potassium repleted through IV fluids. Anemia of chronic disease  Likely due to history of malignancy. Hemoglobin is 9.9 this am.  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 Severe protein calorie malnutrition  Nutrition consulted.Has NG tube for abdominal distention.  DVT prophylaxis  On therapeutic anticoagulation with Lovenox   Code Status: Full.  Family Communication: plan of care discussed with the patient  Disposition Plan: home when stable    IV Access:   Peripheral IV Procedures and diagnostic studies:   US Abdomen Complete 07/28/2014 1. Gallstones with 1 stone measuring 1.7 cm lodged within the gallbladder neck. Wall is thickened to 5.2 mm.  Multiple echogenic foci project along  the gallbladder wall. This could reflect a air. Findings support acute cholecystitis in the proper clinical setting. 2. Hypoechoic liver lesions which are nonspecific. They do not appear to be cysts. Largest lies in the right lobe measuring 15 mm. Liver shows a diffusely coarsened increased echotexture consistent with hepatic steatosis. 3. No bile duct dilation.  Abdominal X ray 07/29/2014 - abdominal distention likely due to gaseous distention in stomach. Consider NG tube for decompression.  Dg Abd Portable 1v 07/31/2014    Interval decompression of the stomach with NG tube.  Persistent gaseous distention of multiple loops of small bowel.     Medical Consultants:   Oncology (Dr. Betsy Coder)  Nephrology (Dr. Roney Jaffe)  Surgery   Other Consultants:   Nutrition   Anti-Infectives:   Zosyn 07/27/2014 -->   Leisa Lenz, MD  [facility] Pager [phone]  If 7PM-7AM, please contact night-coverage [email or web address] Password [facility] 08/01/2014, 11:35 AM   LOS: 5 days    HPI/Subjective: No acute overnight events.  Objective: Filed Vitals:   07/31/14 1010 07/31/14 1400 07/31/14 2142 08/01/14 0458  BP: 145/88 152/90 153/90 152/88  Pulse: 89 93 98 93  Temp:  98.5 F (36.9 C) 98.3 F (36.8 C) 97.5 F (36.4 C)  TempSrc:  Oral Oral Oral  Resp: 25 20 22 20   Height:      Weight:      SpO2: 98% 98% 98% 99%    Intake/Output Summary (Last 24 hours) at 08/01/14 1135 Last data filed at 08/01/14 1000  Gross per 24 hour  Intake 3081.25 ml  Output   3490 ml  Net -408.75 ml    Exam:   General:  Pt is alert, follows commands appropriately, not in acute distress  Cardiovascular: Regular rate and rhythm, S1/S2, no murmurs  Respiratory: Bilateral air entry, no wheezing  Abdomen: Mildly distended, firm to palpation, no significant pain on palpation. Appreciate bowel sounds.  Extremities: No edema, pulses DP and PT palpable bilaterally  Neuro: Grossly nonfocal  Data  Reviewed: Basic Metabolic Panel:  Recent Labs Lab 07/27/14 1849  07/29/14 1940 07/30/14 0215 07/30/14 1420 07/31/14 0442 08/01/14 0435  NA 107*  < > 121* 121* 128* 132* 138  K 4.3  < > 3.2* 2.7* 2.5* 3.8 3.6*  CL <65*  < > 69* 70* 75* 84* 92*  CO2 38*  < > 37* 38* 29 31 27   GLUCOSE 126*  < > 85 87 163* 80 94  BUN 50*  < > 41* 39* 30* 26* 19  CREATININE 4.62*  < > 3.14* 2.79* 1.90* 1.71* 1.22*  CALCIUM 10.1  < > 8.9 8.6 7.4* 8.7 9.1  MG 1.8  --   --   --   --   --   --   PHOS 3.4  --   --   --  2.1*  --  1.7*  < > = values in this interval not displayed. Liver Function Tests:  Recent Labs Lab 07/27/14 1849 07/28/14 0624 07/30/14 1420 07/31/14 0442 08/01/14 0435  AST 32 36  --  19  --   ALT 29 30  --  15  --   ALKPHOS 103 94  --  76  --   BILITOT 0.5 0.6  --  0.4  --   PROT 9.0* 8.1  --  6.9  --   ALBUMIN 3.8 3.4* 2.1* 2.5* 2.4*   No results found for this basename: LIPASE, AMYLASE,  in the  last 168 hours No results found for this basename: AMMONIA,  in the last 168 hours CBC:  Recent Labs Lab 07/27/14 1849 07/28/14 0624 07/31/14 0442 08/01/14 0435  WBC 7.5 7.5 8.5 9.1  NEUTROABS 6.4  --   --   --   HGB 11.1* 10.6* 9.9* 9.8*  HCT 31.2* 29.8* 30.8* 29.3*  MCV 75.2* 75.1* 80.6 79.0  PLT 467* 411* 376 413*   Cardiac Enzymes: No results found for this basename: CKTOTAL, CKMB, CKMBINDEX, TROPONINI,  in the last 168 hours BNP: No components found with this basename: POCBNP,  CBG:  Recent Labs Lab 07/28/14 0846 07/29/14 0742 07/30/14 0824 07/31/14 0741 08/01/14 0812  GLUCAP 132* 101* 81 78 92    Recent Results (from the past 240 hour(s))  MRSA PCR SCREENING     Status: None   Collection Time    07/29/14  9:33 AM      Result Value Ref Range Status   MRSA by PCR NEGATIVE  NEGATIVE Final     Scheduled Meds: . docusate sodium  100 mg Oral BID  . enoxaparin (LOVENOX) injection  100 mg Subcutaneous Q24H  . lactose free nutrition  237 mL Oral TID BM   . piperacillin-tazobactam (ZOSYN)  IV  3.375 g Intravenous Q8H  . polyethylene glycol  17 g Oral BID   Continuous Infusions: . sodium chloride 0.9 % 1,000 mL with potassium chloride 20 mEq infusion 75 mL/hr at 08/01/14 1032

## 2014-08-01 NOTE — Progress Notes (Signed)
Adult failure to thrive  Subjective: Pt still with ileus/pSBO.  No flatus or bowel function since admission.  Electrolytes better.  Cr better.   Objective: Vital signs in last 24 hours: Temp:  [97.5 F (36.4 C)-98.5 F (36.9 C)] 97.5 F (36.4 C) (09/02 0458) Pulse Rate:  [93-98] 93 (09/02 0458) Resp:  [20-22] 20 (09/02 0458) BP: (152-153)/(88-90) 152/88 mmHg (09/02 0458) SpO2:  [98 %-99 %] 99 % (09/02 0458) Last BM Date: 07/29/14  Intake/Output from previous day: 09/01 0701 - 09/02 0700 In: 2531.3 [I.V.:2381.3; IV Piggyback:150] Out: 3664 [Urine:2000; Emesis/NG output:1340] Intake/Output this shift: Total I/O In: 550 [NG/GT:550] Out: 500 [Urine:300; Emesis/NG output:200]  General appearance: alert and cooperative GI: normal findings: soft, non-distended   Lab Results:  Results for orders placed during the hospital encounter of 07/27/14 (from the past 24 hour(s))  CBC     Status: Abnormal   Collection Time    08/01/14  4:35 AM      Result Value Ref Range   WBC 9.1  4.0 - 10.5 K/uL   RBC 3.71 (*) 3.87 - 5.11 MIL/uL   Hemoglobin 9.8 (*) 12.0 - 15.0 g/dL   HCT 29.3 (*) 36.0 - 46.0 %   MCV 79.0  78.0 - 100.0 fL   MCH 26.4  26.0 - 34.0 pg   MCHC 33.4  30.0 - 36.0 g/dL   RDW 15.2  11.5 - 15.5 %   Platelets 413 (*) 150 - 400 K/uL  RENAL FUNCTION PANEL     Status: Abnormal   Collection Time    08/01/14  4:35 AM      Result Value Ref Range   Sodium 138  137 - 147 mEq/L   Potassium 3.6 (*) 3.7 - 5.3 mEq/L   Chloride 92 (*) 96 - 112 mEq/L   CO2 27  19 - 32 mEq/L   Glucose, Bld 94  70 - 99 mg/dL   BUN 19  6 - 23 mg/dL   Creatinine, Ser 1.22 (*) 0.50 - 1.10 mg/dL   Calcium 9.1  8.4 - 10.5 mg/dL   Phosphorus 1.7 (*) 2.3 - 4.6 mg/dL   Albumin 2.4 (*) 3.5 - 5.2 g/dL   GFR calc non Af Amer 46 (*) >90 mL/min   GFR calc Af Amer 54 (*) >90 mL/min   Anion gap 19 (*) 5 - 15  GLUCOSE, CAPILLARY     Status: None   Collection Time    08/01/14  8:12 AM      Result Value Ref Range    Glucose-Capillary 92  70 - 99 mg/dL     Studies/Results Radiology     MEDS, Scheduled . antiseptic oral rinse  7 mL Mouth Rinse q12n4p  . chlorhexidine  15 mL Mouth Rinse BID  . cycloSPORINE  1 drop Both Eyes BID  . docusate sodium  100 mg Oral BID  . enoxaparin (LOVENOX) injection  100 mg Subcutaneous Q24H  . lactose free nutrition  237 mL Oral TID BM  . piperacillin-tazobactam (ZOSYN)  IV  3.375 g Intravenous Q8H  . polyethylene glycol  17 g Oral BID  . sodium chloride  10-40 mL Intracatheter Q12H     Assessment: Adult failure to thrive Ileus vs pSBO  Plan: NG seemed a bit far on AXR.  Pulled back 10cm.  Repeat films in am.    LOS: 5 days    Rosario Adie, [facility], [place] [phone]   08/01/2014 12:00 PM

## 2014-08-01 NOTE — Progress Notes (Signed)
IP PROGRESS NOTE  Subjective: No complaints.  Objective: Vital signs in last 24 hours: Temp:  [97.5 F (36.4 C)-98.3 F (36.8 C)] 97.7 F (36.5 C) (09/02 1358) Pulse Rate:  [93-109] 109 (09/02 1358) Resp:  [18-22] 18 (09/02 1358) BP: (152-171)/(88-99) 171/99 mmHg (09/02 1358) SpO2:  [98 %-100 %] 100 % (09/02 1358)  Intake/Output from previous day: 09/01 0701 - 09/02 0700 In: 2531.3 [I.V.:2381.3; IV Piggyback:150] Out: 7169 [Urine:2000; Emesis/NG output:1340] Intake/Output this shift: Total I/O In: 790 [P.O.:240; NG/GT:550] Out: 1100 [Urine:600; Emesis/NG output:500]  Abdomen distended. Hypoactive bowel sounds. Flat affect.  Lab Results:   Recent Labs  07/31/14 0442 08/01/14 0435  WBC 8.5 9.1  HGB 9.9* 9.8*  HCT 30.8* 29.3*  PLT 376 413*   BMET  Recent Labs  07/31/14 0442 08/01/14 0435  NA 132* 138  K 3.8 3.6*  CL 84* 92*  CO2 31 27  GLUCOSE 80 94  BUN 26* 19  CREATININE 1.71* 1.22*  CALCIUM 8.7 9.1    Studies/Results: Dg Abd Portable 1v  07/31/2014   CLINICAL DATA:  Abdominal distension  EXAM: PORTABLE ABDOMEN - 1 VIEW  COMPARISON:  Abdominal radiograph 07/29/2014  FINDINGS: Oral contrast material demonstrated within the ascending colon. NG tube tip and side-port project over the stomach which has been decompressed in the interval. Residual gaseous distended loops of small bowel within the left upper quadrant. Lower lumbar spine degenerative change. Surgical hardware proximal left femur. Nonspecific linear radiodensity projecting over the left upper quadrant may potentially represent contrast within nondilated bowel or be external to the patient.  IMPRESSION: Interval decompression of the stomach with NG tube.  Persistent gaseous distention of multiple loops of small bowel.   Electronically Signed   By: Lovey Newcomer M.D.   On: 07/31/2014 12:45      Assessment/Plan: 1. 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  Initiation of radiation and  concurrent Xeloda 10/16/2013.  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  Cycle 1 adjuvant Xeloda 02/05/2014.  Cycle 2 adjuvant Xeloda 02/26/2014.  Cycle 3 adjuvant Xeloda 03/18/2014.  Cycle 4 adjuvant Xeloda 04/08/2014.  2. Indeterminate right lung nodule on CT 09/29/2013. 3. History of weight loss.  4. Left leg DVT 03/15/2014. On Lovenox. 5. Admission for ileostomy reversal 06/07/2014. 6. Hospitalization 07/11/2014 through 07/14/2014 with a small bowel obstruction. Patient felt to most likely have a narrow area at her small bowel anastomosis due to inflammation after surgery and not a true obstruction. 7. Elevated CEA 07/14/2014. 8. CT chest/abdomen/pelvis 07/18/2014 with a new small right pleural effusion; stable 5 mm posterior right upper lobe lung nodule; gallstones noted within the gallbladder; marked gallbladder wall thickening measuring up to 11 mm in thickness at the fundus; abnormal low density noted in the adjacent inferior liver with small scattered satellite nodules in the adjacent liver. Findings within the liver new since the prior study. Small amount of perihepatic fluid adjacent to the right hepatic lobe and gallbladder. Extensive edema/stranding within the pelvis extending from the presacral space anteriorly. New area of sclerosis right pubic bone. Abdominal ultrasound 07/28/2014 concerning for acute cholecystitis 9. Acute renal failure/hyponatremia-seen by nephrology, renal failure improved with hydration. 10. Bowel obstruction requiring NG tube placement  Electrolytes and kidney function are improved. She continues to have abdominal distention and now has a nasogastric tube in place. Surgery is following, ileus versus partial small bowel obstruction. Repeat x-ray  planned in a.m.      LOS: 5 days    Ned Card, ANP/GNP-BC 08/01/2014 4:38 PM  She  continues to have evidence of an ileus versus bowel obstruction. She is being followed by Dr. Marcello Moores. Plans for a liver biopsy have been placed on hold.   I will be out until 08/07/2014.  Please call oncology as needed.

## 2014-08-02 ENCOUNTER — Inpatient Hospital Stay (HOSPITAL_COMMUNITY): Payer: BC Managed Care – PPO

## 2014-08-02 LAB — GLUCOSE, CAPILLARY
Glucose-Capillary: 136 mg/dL — ABNORMAL HIGH (ref 70–99)
Glucose-Capillary: 130 mg/dL — ABNORMAL HIGH (ref 70–99)
Glucose-Capillary: 150 mg/dL — ABNORMAL HIGH (ref 70–99)

## 2014-08-02 LAB — CBC
HCT: 29.5 % — ABNORMAL LOW (ref 36.0–46.0)
HEMOGLOBIN: 10 g/dL — AB (ref 12.0–15.0)
MCH: 26.3 pg (ref 26.0–34.0)
MCHC: 33.9 g/dL (ref 30.0–36.0)
MCV: 77.6 fL — AB (ref 78.0–100.0)
PLATELETS: 344 10*3/uL (ref 150–400)
RBC: 3.8 MIL/uL — ABNORMAL LOW (ref 3.87–5.11)
RDW: 15.5 % (ref 11.5–15.5)
WBC: 8.5 10*3/uL (ref 4.0–10.5)

## 2014-08-02 LAB — BASIC METABOLIC PANEL
Anion gap: 15 (ref 5–15)
BUN: 14 mg/dL (ref 6–23)
CO2: 31 mEq/L (ref 19–32)
CREATININE: 1.28 mg/dL — AB (ref 0.50–1.10)
Calcium: 9.3 mg/dL (ref 8.4–10.5)
Chloride: 93 mEq/L — ABNORMAL LOW (ref 96–112)
GFR calc Af Amer: 51 mL/min — ABNORMAL LOW (ref >90)
GFR, EST NON AFRICAN AMERICAN: 44 mL/min — AB (ref >90)
Glucose, Bld: 132 mg/dL — ABNORMAL HIGH (ref 70–99)
Potassium: 3.4 mEq/L — ABNORMAL LOW (ref 3.7–5.3)
Sodium: 139 mEq/L (ref 137–147)

## 2014-08-02 MED ORDER — SODIUM CHLORIDE 0.9 % IV SOLN
INTRAVENOUS | Status: AC
  Administered 2014-08-02: 20:00:00 via INTRAVENOUS
  Filled 2014-08-02: qty 1000

## 2014-08-02 MED ORDER — LORAZEPAM 2 MG/ML IJ SOLN
0.5000 mg | Freq: Once | INTRAMUSCULAR | Status: AC
  Administered 2014-08-02: 0.5 mg via INTRAVENOUS
  Filled 2014-08-02: qty 1

## 2014-08-02 MED ORDER — INSULIN ASPART 100 UNIT/ML ~~LOC~~ SOLN: 0.0000 [IU] | Freq: Three times a day (TID) | SUBCUTANEOUS | Status: DC

## 2014-08-02 MED ORDER — INSULIN ASPART 100 UNIT/ML ~~LOC~~ SOLN
0.0000 [IU] | Freq: Four times a day (QID) | SUBCUTANEOUS | Status: DC
  Administered 2014-08-03: 2 [IU] via SUBCUTANEOUS
  Administered 2014-08-03: 1 [IU] via SUBCUTANEOUS

## 2014-08-02 MED ORDER — SODIUM CHLORIDE 0.9 % IV SOLN
INTRAVENOUS | Status: DC
  Filled 2014-08-02: qty 1000

## 2014-08-02 MED ORDER — FAT EMULSION 20 % IV EMUL
250.0000 mL | INTRAVENOUS | Status: AC
  Administered 2014-08-02: 250 mL via INTRAVENOUS
  Filled 2014-08-02: qty 250

## 2014-08-02 MED ORDER — INSULIN ASPART 100 UNIT/ML ~~LOC~~ SOLN: 0.0000 [IU] | Freq: Every day | SUBCUTANEOUS | Status: DC

## 2014-08-02 MED ORDER — TRACE MINERALS CR-CU-F-FE-I-MN-MO-SE-ZN IV SOLN
INTRAVENOUS | Status: AC
  Administered 2014-08-02: 17:00:00 via INTRAVENOUS
  Filled 2014-08-02: qty 1000

## 2014-08-02 MED ORDER — POTASSIUM PHOSPHATES 15 MMOLE/5ML IV SOLN
30.0000 mmol | Freq: Once | INTRAVENOUS | Status: AC
  Administered 2014-08-02: 30 mmol via INTRAVENOUS
  Filled 2014-08-02: qty 10

## 2014-08-02 NOTE — Progress Notes (Signed)
NUTRITION FOLLOW UP/TPN consult  Intervention:   -TPN management per pharmacy- monitor refeeding labs- phos, Mg, K and replete as needed (pt at  risk for refeeding d/t poor PO intake > one month and hx of signficant weight loss) -Diet advancement per MD -Boost plus supplement TID as tolerated  -Will continue to monitor  Nutrition Dx:   Inadequate oral intake related to altered GI function as evidenced by npo and high output per NG.   Goal:   Tolerate diet advancement with intake of meals and supplements to meet >90% estimated needs; not met  New Goal: TPN to meet >/= 90% of their estimated nutrition needs    Monitor:   TPN tolerance, diet order, total protein/energy intake, labs, weights, GI profile  Assessment:   8/30: -Patient currently asleep. Clear when arouses per nurse.  -Unable to tolerate clear liquid diet.  -Now NPO with NG tube in place. 2L output thus far.  -Drinks Boost at home but other intake unknown at this time. -Hx includes severe protein calorie malnutrition.  9/03: -MD noted pt continues with ileus vs pSBO -NGT continues on suction, today output 350 ml. Last BM on 8/30, having flatus -Was seen by RD during previous admit in 07/13/2014, reported poor PO intake for > one month and endorsed an 18 lb weight loss in one month. Had been d/c'd on full liquid/pureed diet w/Boost supplement TID. -However, pt reported only tolerating Boost BID-TID past 2-3 weeks. Consumed some bites of jello, but unable to tolerate puree foods - RD Re-estimated needs d/t weight loss and poor PO -Pt to have PICC, plan to initiate TPN. Pharmacy plan to start Clinimix 5/20 at 35 ml/hr and titrate to goal rate of 65 ml/hr with 20% lipids at 10 ml/hr -Goal rate would provide 1852 kcal (100% est kcal needs), 78 gram protein (98% est protein needs) -Mg Low/Phos WNL on 8/31. K low-being repleted. Pt at risk of refeeding d/t poor PO > one month, and hx of significant weight loss. Monitor and  replete as needed -CBGs < 150 mg/dl   Height: Ht Readings from Last 1 Encounters:  07/29/14 5' 5"  (1.651 m)    Weight Status:   Wt Readings from Last 1 Encounters:  08/02/14 162 lb 5 oz (73.624 kg)  07/29/14 152 lbs  Re-estimated needs:  Kcal: 1700-1800  Protein: 80-90 gm  Fluid: 1.7L daily   Skin: +1 RLE Edema, +1 LLE edema  Diet Order:     Intake/Output Summary (Last 24 hours) at 08/02/14 1353 Last data filed at 08/02/14 1348  Gross per 24 hour  Intake 2446.67 ml  Output   3875 ml  Net -1428.33 ml    Last BM: 8/30   Labs:   Recent Labs Lab 07/27/14 1849  07/30/14 1420 07/31/14 0442 08/01/14 0435 08/02/14 1005  NA 107*  < > 128* 132* 138 139  K 4.3  < > 2.5* 3.8 3.6* 3.4*  CL <65*  < > 75* 84* 92* 93*  CO2 38*  < > 29 31 27 31   BUN 50*  < > 30* 26* 19 14  CREATININE 4.62*  < > 1.90* 1.71* 1.22* 1.28*  CALCIUM 10.1  < > 7.4* 8.7 9.1 9.3  MG 1.8  --   --   --   --   --   PHOS 3.4  --  2.1*  --  1.7*  --   GLUCOSE 126*  < > 163* 80 94 132*  < > = values in  this interval not displayed.  CBG (last 3)   Recent Labs  07/31/14 0741 08/01/14 0812 08/02/14 0747  GLUCAP 78 92 136*    Scheduled Meds: . antiseptic oral rinse  7 mL Mouth Rinse q12n4p  . chlorhexidine  15 mL Mouth Rinse BID  . cycloSPORINE  1 drop Both Eyes BID  . docusate sodium  100 mg Oral BID  . enoxaparin (LOVENOX) injection  100 mg Subcutaneous Q24H  . insulin aspart  0-9 Units Subcutaneous Q6H  . lactose free nutrition  237 mL Oral TID BM  . piperacillin-tazobactam (ZOSYN)  IV  3.375 g Intravenous Q8H  . polyethylene glycol  17 g Oral BID  . potassium phosphate IVPB (mmol)  30 mmol Intravenous Once  . sodium chloride  10-40 mL Intracatheter Q12H    Continuous Infusions: . Marland KitchenTPN (CLINIMIX-E) Adult     And  . fat emulsion    . sodium chloride 0.9 % 1,000 mL with potassium chloride 20 mEq infusion     Atlee Abide MS RD LDN Clinical Dietitian [phone]

## 2014-08-02 NOTE — Progress Notes (Signed)
Adult failure to thrive  Subjective: Pt still with ileus/pSBO.  Having some flatus now.  Electrolytes better.  Cr better. Pain about the same.  Not ambulating  Objective: Vital signs in last 24 hours: Temp:  [97.6 F (36.4 C)-97.9 F (36.6 C)] 97.6 F (36.4 C) (09/03 0552) Pulse Rate:  [109-115] 115 (09/03 0552) Resp:  [18-21] 20 (09/03 0552) BP: (131-171)/(79-105) 131/79 mmHg (09/03 0552) SpO2:  [97 %-100 %] 98 % (09/03 0552) Weight:  [162 lb 5 oz (73.624 kg)] 162 lb 5 oz (73.624 kg) (09/03 0500) Last BM Date: 07/29/14  Intake/Output from previous day: 09/02 0701 - 09/03 0700 In: 2976.7 [P.O.:240; I.V.:2036.7; NG/GT:550; IV Piggyback:150] Out: 3500 [Urine:1550; Emesis/NG output:1950] Intake/Output this shift: Total I/O In: -  Out: 300 [Emesis/NG output:300]  General appearance: alert and cooperative GI: normal findings: soft, non-distended   Lab Results:  Results for orders placed during the hospital encounter of 07/27/14 (from the past 24 hour(s))  GLUCOSE, CAPILLARY     Status: Abnormal   Collection Time    08/02/14  7:47 AM      Result Value Ref Range   Glucose-Capillary 136 (*) 70 - 99 mg/dL     Studies/Results Radiology     MEDS, Scheduled . antiseptic oral rinse  7 mL Mouth Rinse q12n4p  . chlorhexidine  15 mL Mouth Rinse BID  . cycloSPORINE  1 drop Both Eyes BID  . docusate sodium  100 mg Oral BID  . enoxaparin (LOVENOX) injection  100 mg Subcutaneous Q24H  . lactose free nutrition  237 mL Oral TID BM  . piperacillin-tazobactam (ZOSYN)  IV  3.375 g Intravenous Q8H  . polyethylene glycol  17 g Oral BID  . sodium chloride  10-40 mL Intracatheter Q12H     Assessment: Adult failure to thrive Ileus vs pSBO  Plan: Recheck AXR Pt with little intake for the past 2-3 weeks.  Will start TPN May need laparoscopy evaluation with possible liver biopsy, if she does not open up soon    LOS: 6 days    Rosario Adie, MD [facility],  [place]   08/02/2014 9:27 AM

## 2014-08-02 NOTE — Progress Notes (Signed)
PT Cancellation/Screen Note  Patient Details Name: Victoria [name] MRN: [id number] DOB: [birth year]   Cancelled Treatment:    Reason Eval/Treat Not Completed: PT screened, no needs identified, will sign off--spoke with patient who denied PT needs at this time. Also spoke with RN who reported pt ambuated in hallway with nursing supervision . At this time will sign off. Please reorder if mobility needs change. Thanks.    Weston Anna, MPT Pager: [phone]

## 2014-08-02 NOTE — Progress Notes (Signed)
PARENTERAL NUTRITION CONSULT NOTE - INITIAL  Pharmacy Consult for New TPN Indication: Prolonged ileus  Allergies  Allergen Reactions  . Ointment Base [Lanolin-Petrolatum] Rash    Specifically burn ointments.    Patient Measurements: Height: 5\' 5"  (165.1 cm) Weight: 162 lb 5 oz (73.624 kg) IBW/kg (Calculated) : 57 Adjusted Body Weight: 62 kg Usual Weight: 73 kg  Vital Signs: Temp: 97.6 F (36.4 C) (09/03 0552) Temp src: Oral (09/03 0552) BP: 131/79 mmHg (09/03 0552) Pulse Rate: 115 (09/03 0552) Intake/Output from previous day: 09/02 0701 - 09/03 0700 In: 2976.7 [P.O.:240; I.V.:2036.7; NG/GT:550; IV Piggyback:150] Out: 3500 [Urine:1550; Emesis/NG output:1950] Intake/Output from this shift: Total I/O In: 20 [I.V.:20] Out: 700 [Urine:400; Emesis/NG output:300]  Labs:  Recent Labs  07/31/14 0442 08/01/14 0435 08/02/14 1005  WBC 8.5 9.1 8.5  HGB 9.9* 9.8* 10.0*  HCT 30.8* 29.3* 29.5*  PLT 376 413* 344     Recent Labs  07/30/14 1420 07/31/14 0442 08/01/14 0435 08/02/14 1005  NA 128* 132* 138 139  K 2.5* 3.8 3.6* 3.4*  CL 75* 84* 92* 93*  CO2 29 31 27 31   GLUCOSE 163* 80 94 132*  BUN 30* 26* 19 14  CREATININE 1.90* 1.71* 1.22* 1.28*  CALCIUM 7.4* 8.7 9.1 9.3  PHOS 2.1*  --  1.7*  --   PROT  --  6.9  --   --   ALBUMIN 2.1* 2.5* 2.4*  --   AST  --  19  --   --   ALT  --  15  --   --   ALKPHOS  --  76  --   --   BILITOT  --  0.4  --   --    Estimated Creatinine Clearance: 45.8 ml/min (by C-G formula based on Cr of 1.28).    Recent Labs  07/31/14 0741 08/01/14 0812 08/02/14 0747  GLUCAP 78 92 136*    Medical History: Past Medical History  Diagnosis Date  . Medical history non-contributory   . HDL deficiency   . Allergy     seasonal allergic rhinitis  . History of radiation therapy 10/16/13-11/27/13    rectal 50.4Gy  . Peripheral vascular disease 4/15    DVT LEFT LEG  . Cancer 09/19/13    rectum    Medications:  Infusions:  . Marland KitchenTPN  (CLINIMIX-E) Adult     And  . fat emulsion    . sodium chloride 0.9 % 1,000 mL with potassium chloride 20 mEq infusion      Insulin Requirements: None previously; no Hx DM  Current Nutrition: Ice chips only (no diet orders)  IVF: NS w/ 20 mEq K @ 75 ml/hr  Central access: PICC TPN start date: 9/3  ASSESSMENT                                                                                                          HPI: 63 year old female with history of rectal adenocarcinoma, status post resection and diverting ileostomy in 12/2013 with reversal on 06/07/2014, Recent hospitalization for small  bowel obstruction 07/11/14 through 07/14/2014 who presented from cancer center with N/V, FTT.  Her hospital course was complicated with hyponatremia and acute renal failure, though both appear to be back to baseline at this time.  Ileus/partial SBO has prevented meaningful nutrition over the previous 2-3 weeks and here as well, and pharmacy was consulted to begin TPN.  Significant events:  9/3: noted to have some flatus.  Electrolyte, renal disturbances resolved  Today:    Glucose - 136; none < 150 during this admission  Electrolytes - K, phos low, otherwise wnl  Renal - near baseline (1.2 historically)  LFTs - WNL  TGs - pending  Prealbumin - pending  T. Bili 0.4 but trending down and no signs of jaundice  NUTRITIONAL GOALS                                                                                             RD recs: pending consult  Nutritional goals per pharmacy: Clinimix 5/20 at a goal rate of 65 ml/hr + 20% fat emulsion at 10 ml/hr to provide: 78 g/day protein, 1852 Kcal/day.  PLAN                                                                                                                         At 1800 today:  Start Clinimix 5/20 at 35 ml/hr.  20% fat emulsion at 10 ml/hr.  Plan to advance as tolerated to the goal rate.  TNA to contain standard multivitamins and  trace elements.  Reduce IVF to 40 ml/hr.  Added SSI w/ q6hr checks.   TNA lab panels on Mondays & Thursdays.  F/u daily.  Will also give NaPhos to deliver 30 mmol phos and 45 mEq potassium  Reuel Boom, PharmD Pager: [phone] 08/02/2014, 12:54 PM

## 2014-08-02 NOTE — Progress Notes (Signed)
Patient ID: Victoria [name], female   DOB: 12-10-1950, 63 y.o.   MRN: [id number] [facility] PROGRESS NOTE  Victoria [name] JME:[id number] DOB: August 10, 1951 DOA: 07/27/2014 PCP: Irven Shelling, MD  Brief narrative: 63 year old female with history of rectal adenocarcinoma, status post radiation and chemotherapy, status post resection and diverting ileostomy in 12/2013, ileostomy reversal on 06/07/2014, history of DVT on anticoagulation with Lovenox, recent hospitalization for small bowel obstruction 07/11/14 through 07/14/2014 who presented from cancer center with nausea and vomiting, failure to thrive. Abdominal US showed possible acute cholecystitis for which she was started on zosyn. In addition, she was found to have severe hyponatremia of 107 but it has improved with IV normal saline. Her creatinine was 4.62 on admission which was thought to be reflective of recent CT abdomen with contrast she had 07/18/2014. Her hospital course is complicated with ongoing small bowel obstruction.   Assessment & Plan   Principal Problem:  Abdominal distention / Small bowel obstruction Initial abd x ray showed gaseous distention and NG tube was placed for decompression. She felt she can tolerate clear liquid diet which was initiated 08/01/2014 but she ahs more distention this am so will stop this and go back to NPO.  Continue to monitor NG tube output. Appreciate surgery following and recommendations.  Continue Colace, MiraLAX, simethicone for bowel regimen.  Active Problems:  Acute cholecystitis  Acute cholecystitis noted on abdominal US. Pt started on empiric zosyn. No significant pain on physical exam.  Continue Zosyn for now. I think we can hold off on HIDA scan for now. Patient remains afebrile and white blood cell count is within normal limits.  No surgical interventions planned at this time per surgery.  Rectal adenocarcinoma with liver metastases  Recent CT abdomen done 07/18/2014 with  findings of new small right pleural effusion; stable 5 mm posterior right upper lobe lung nodule; gallstones noted within the gallbladder; marked gallbladder wall thickening measuring up to 11 mm in thickness at the fundus; abnormal low density noted in the adjacent inferior liver with small scattered satellite nodules in the adjacent liver. Findings within the liver new since the prior study. Small amount of perihepatic fluid adjacent to the right hepatic lobe and gallbladder. Extensive edema/stranding within the pelvis extending from the presacral space anteriorly. New area of sclerosis right pubic bone.  Appreciate oncology following Acute renal failure / Contrast induced nephropathy  Possibly contrast induced nephropathy  Creatinine starting to trend down, 3.47 --> 2.79 --> 1.71 --> 1.22  Appreciate nephrology team following.  Hypochloremic hyponatremia / metabolic alkalosis / hypokalemia  Likely dehydration, GI losses  Sodium trending up, 107 --> 138  Potassium repleted through IV fluids. Anemia of chronic disease  Likely due to history of malignancy. Hemoglobin is 9.9 this am.  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 Severe protein calorie malnutrition  Nutrition consulted.Has NG tube for abdominal distention.  Started TNA for nutritional support.  DVT prophylaxis  On therapeutic anticoagulation with Lovenox   Code Status: Full.  Family Communication: plan of care discussed with the patient and her husband at the bedside  Disposition Plan: home when stable    IV Access:   Peripheral IV Procedures and diagnostic studies:   US Abdomen Complete 07/28/2014 1. Gallstones with 1 stone measuring 1.7 cm lodged within the gallbladder neck. Wall is thickened to 5.2 mm. Multiple echogenic foci project along the gallbladder wall. This could reflect a air. Findings  support acute cholecystitis in the proper clinical setting.  2. Hypoechoic liver lesions which are nonspecific. They do not appear to be cysts. Largest lies in the right lobe measuring 15 mm. Liver shows a diffusely coarsened increased echotexture consistent with hepatic steatosis. 3. No bile duct dilation.  Abdominal X ray 07/29/2014 - abdominal distention likely due to gaseous distention in stomach. Consider NG tube for decompression.  Dg Abd Portable 1v 07/31/2014 Interval decompression of the stomach with NG tube. Persistent gaseous distention of multiple loops of small bowel.   Medical Consultants:   Oncology (Dr. Betsy Coder)  Nephrology (Dr. Roney Jaffe)  Surgery   Other Consultants:   Nutrition   Anti-Infectives:   Zosyn 07/27/2014 -->   Leisa Lenz, MD  [facility] Pager [phone]  If 7PM-7AM, please contact night-coverage [email or web address] Password [facility] 08/02/2014, 8:51 AM   LOS: 6 days    HPI/Subjective: No acute overnight events.  Objective: Filed Vitals:   08/01/14 2049 08/01/14 2223 08/02/14 0500 08/02/14 0552  BP: 157/105 154/100  131/79  Pulse: 114 109  115  Temp: 97.9 F (36.6 C)   97.6 F (36.4 C)  TempSrc: Oral   Oral  Resp: 21 21  20   Height:      Weight:   73.624 kg (162 lb 5 oz)   SpO2: 98% 97%  98%    Intake/Output Summary (Last 24 hours) at 08/02/14 0851 Last data filed at 08/02/14 0600  Gross per 24 hour  Intake 2426.67 ml  Output   3500 ml  Net -1073.33 ml    Exam:   General:  Pt is alert, follows commands appropriately, not in acute distress  Cardiovascular: Regular rate and rhythm, S1/S2 appreciated   Respiratory: Clear to auscultation bilaterally, no wheezing  Abdomen: NG tube in place, abdomen more distended but not tender to palpation  Extremities: No edema, pulses DP and PT palpable bilaterally  Neuro: Grossly nonfocal  Data Reviewed: Basic Metabolic Panel:  Recent Labs Lab 07/27/14 1849  07/29/14 1940 07/30/14 0215 07/30/14 1420 07/31/14 0442 08/01/14 0435  NA  107*  < > 121* 121* 128* 132* 138  K 4.3  < > 3.2* 2.7* 2.5* 3.8 3.6*  CL <65*  < > 69* 70* 75* 84* 92*  CO2 38*  < > 37* 38* 29 31 27   GLUCOSE 126*  < > 85 87 163* 80 94  BUN 50*  < > 41* 39* 30* 26* 19  CREATININE 4.62*  < > 3.14* 2.79* 1.90* 1.71* 1.22*  CALCIUM 10.1  < > 8.9 8.6 7.4* 8.7 9.1  MG 1.8  --   --   --   --   --   --   PHOS 3.4  --   --   --  2.1*  --  1.7*  < > = values in this interval not displayed. Liver Function Tests:  Recent Labs Lab 07/27/14 1849 07/28/14 0624 07/30/14 1420 07/31/14 0442 08/01/14 0435  AST 32 36  --  19  --   ALT 29 30  --  15  --   ALKPHOS 103 94  --  76  --   BILITOT 0.5 0.6  --  0.4  --   PROT 9.0* 8.1  --  6.9  --   ALBUMIN 3.8 3.4* 2.1* 2.5* 2.4*   No results found for this basename: LIPASE, AMYLASE,  in the last 168 hours No results found for this basename: AMMONIA,  in the last 168 hours CBC:  Recent Labs Lab 07/27/14 1849 07/28/14 0624 07/31/14 0442 08/01/14 0435  WBC 7.5 7.5 8.5 9.1  NEUTROABS 6.4  --   --   --   HGB 11.1* 10.6* 9.9* 9.8*  HCT 31.2* 29.8* 30.8* 29.3*  MCV 75.2* 75.1* 80.6 79.0  PLT 467* 411* 376 413*   Cardiac Enzymes: No results found for this basename: CKTOTAL, CKMB, CKMBINDEX, TROPONINI,  in the last 168 hours BNP: No components found with this basename: POCBNP,  CBG:  Recent Labs Lab 07/29/14 0742 07/30/14 0824 07/31/14 0741 08/01/14 0812 08/02/14 0747  GLUCAP 101* 81 78 92 136*    MRSA PCR SCREENING     Status: None   Collection Time    07/29/14  9:33 AM      Result Value Ref Range Status   MRSA by PCR NEGATIVE  NEGATIVE Final     Scheduled Meds: . cycloSPORINE  1 drop Both Eyes BID  . docusate sodium  100 mg Oral BID  . enoxaparin (LOVENOX) injection  100 mg Subcutaneous Q24H  . lactose free nutrition  237 mL Oral TID BM  . piperacillin-tazobactam (ZOSYN)  IV  3.375 g Intravenous Q8H  . polyethylene glycol  17 g Oral BID  . sodium chloride  10-40 mL Intracatheter Q12H    Continuous Infusions: . sodium chloride 0.9 % 1,000 mL with potassium chloride 20 mEq infusion 75 mL/hr at 08/01/14 2019

## 2014-08-03 DIAGNOSIS — E871 Hypo-osmolality and hyponatremia: Secondary | ICD-10-CM

## 2014-08-03 DIAGNOSIS — D638 Anemia in other chronic diseases classified elsewhere: Secondary | ICD-10-CM

## 2014-08-03 LAB — GLUCOSE, CAPILLARY
GLUCOSE-CAPILLARY: 142 mg/dL — AB (ref 70–99)
GLUCOSE-CAPILLARY: 163 mg/dL — AB (ref 70–99)
GLUCOSE-CAPILLARY: 178 mg/dL — AB (ref 70–99)
Glucose-Capillary: 150 mg/dL — ABNORMAL HIGH (ref 70–99)
Glucose-Capillary: 145 mg/dL — ABNORMAL HIGH (ref 70–99)
Glucose-Capillary: 131 mg/dL — ABNORMAL HIGH (ref 70–99)

## 2014-08-03 LAB — COMPREHENSIVE METABOLIC PANEL
ALT: 47 U/L — ABNORMAL HIGH (ref 0–35)
ANION GAP: 16 — AB (ref 5–15)
AST: 34 U/L (ref 0–37)
Albumin: 2.5 g/dL — ABNORMAL LOW (ref 3.5–5.2)
Alkaline Phosphatase: 87 U/L (ref 39–117)
BUN: 19 mg/dL (ref 6–23)
CO2: 32 mEq/L (ref 19–32)
Calcium: 9.1 mg/dL (ref 8.4–10.5)
Chloride: 90 mEq/L — ABNORMAL LOW (ref 96–112)
Creatinine, Ser: 1.53 mg/dL — ABNORMAL HIGH (ref 0.50–1.10)
GFR calc Af Amer: 41 mL/min — ABNORMAL LOW (ref >90)
GFR calc non Af Amer: 35 mL/min — ABNORMAL LOW (ref >90)
GLUCOSE: 145 mg/dL — AB (ref 70–99)
Potassium: 3.1 mEq/L — ABNORMAL LOW (ref 3.7–5.3)
Sodium: 138 mEq/L (ref 137–147)
Total Bilirubin: 0.4 mg/dL (ref 0.3–1.2)
Total Protein: 6.9 g/dL (ref 6.0–8.3)

## 2014-08-03 LAB — DIFFERENTIAL
BASOS PCT: 0 % (ref 0–1)
Basophils Absolute: 0 10*3/uL (ref 0.0–0.1)
EOS ABS: 0 10*3/uL (ref 0.0–0.7)
Eosinophils Relative: 0 % (ref 0–5)
LYMPHS ABS: 0.8 10*3/uL (ref 0.7–4.0)
LYMPHS PCT: 9 % — AB (ref 12–46)
MONOS PCT: 11 % (ref 3–12)
Monocytes Absolute: 1 10*3/uL (ref 0.1–1.0)
NEUTROS ABS: 7.6 10*3/uL (ref 1.7–7.7)
Neutrophils Relative %: 80 % — ABNORMAL HIGH (ref 43–77)

## 2014-08-03 LAB — TRIGLYCERIDES: Triglycerides: 170 mg/dL — ABNORMAL HIGH (ref <150)

## 2014-08-03 LAB — CBC
HEMATOCRIT: 28.5 % — AB (ref 36.0–46.0)
Hemoglobin: 9.6 g/dL — ABNORMAL LOW (ref 12.0–15.0)
MCH: 26.7 pg (ref 26.0–34.0)
MCHC: 33.7 g/dL (ref 30.0–36.0)
MCV: 79.2 fL (ref 78.0–100.0)
Platelets: 363 10*3/uL (ref 150–400)
RBC: 3.6 MIL/uL — ABNORMAL LOW (ref 3.87–5.11)
RDW: 15.7 % — ABNORMAL HIGH (ref 11.5–15.5)
WBC: 9.4 10*3/uL (ref 4.0–10.5)

## 2014-08-03 LAB — MAGNESIUM: Magnesium: 1.6 mg/dL (ref 1.5–2.5)

## 2014-08-03 LAB — PREALBUMIN: PREALBUMIN: 12.9 mg/dL — AB (ref 17.0–34.0)

## 2014-08-03 LAB — PHOSPHORUS: PHOSPHORUS: 4.5 mg/dL (ref 2.3–4.6)

## 2014-08-03 MED ORDER — FAT EMULSION 20 % IV EMUL
250.0000 mL | INTRAVENOUS | Status: AC
  Administered 2014-08-03: 250 mL via INTRAVENOUS
  Filled 2014-08-03: qty 250

## 2014-08-03 MED ORDER — LORAZEPAM 2 MG/ML IJ SOLN
1.0000 mg | Freq: Once | INTRAMUSCULAR | Status: AC
  Administered 2014-08-03: 1 mg via INTRAVENOUS
  Filled 2014-08-03: qty 1

## 2014-08-03 MED ORDER — M.V.I. ADULT IV INJ
INJECTION | INTRAVENOUS | Status: AC
  Administered 2014-08-03: 17:00:00 via INTRAVENOUS
  Filled 2014-08-03: qty 1000

## 2014-08-03 MED ORDER — INSULIN ASPART 100 UNIT/ML ~~LOC~~ SOLN
0.0000 [IU] | Freq: Four times a day (QID) | SUBCUTANEOUS | Status: DC
  Administered 2014-08-03: 2 [IU] via SUBCUTANEOUS
  Administered 2014-08-04 (×3): 3 [IU] via SUBCUTANEOUS
  Administered 2014-08-05: 2 [IU] via SUBCUTANEOUS
  Administered 2014-08-05 – 2014-08-06 (×6): 3 [IU] via SUBCUTANEOUS
  Administered 2014-08-06 – 2014-08-07 (×3): 2 [IU] via SUBCUTANEOUS

## 2014-08-03 MED ORDER — POTASSIUM CHLORIDE IN NACL 40-0.9 MEQ/L-% IV SOLN
INTRAVENOUS | Status: AC
  Administered 2014-08-03: 25 mL/h via INTRAVENOUS
  Filled 2014-08-03 (×2): qty 1000

## 2014-08-03 MED ORDER — MAGNESIUM SULFATE 40 MG/ML IJ SOLN
2.0000 g | Freq: Once | INTRAMUSCULAR | Status: AC
  Administered 2014-08-03: 2 g via INTRAVENOUS
  Filled 2014-08-03: qty 50

## 2014-08-03 MED ORDER — POTASSIUM CHLORIDE 10 MEQ/100ML IV SOLN
10.0000 meq | INTRAVENOUS | Status: AC
  Administered 2014-08-03 (×2): 10 meq via INTRAVENOUS
  Filled 2014-08-03 (×2): qty 100

## 2014-08-03 NOTE — Progress Notes (Signed)
ANTIBIOTIC CONSULT NOTE   Pharmacy Consult for zosyn Indication: cholecystitis  Allergies  Allergen Reactions  . Ointment Base [Lanolin-Petrolatum] Rash    Specifically burn ointments.    Patient Measurements: Height: 5\' 5"  (165.1 cm) Weight:  (Refusal to get up per husband) IBW/kg (Calculated) : 57 Adjusted Body Weight:   Vital Signs: Temp: 98.2 F (36.8 C) (09/04 0600) Temp src: Oral (09/04 0600) BP: 131/87 mmHg (09/04 0600) Pulse Rate: 107 (09/04 0600) Intake/Output from previous day: 09/03 0701 - 09/04 0700 In: 1052.2 [I.V.:380; IV Piggyback:150; TPN:522.2] Out: 3150 [Urine:1125; Emesis/NG output:2025] Intake/Output from this shift:    Labs:  Recent Labs  08/01/14 0435 08/02/14 1005 08/03/14 0436  WBC 9.1 8.5 9.4  HGB 9.8* 10.0* 9.6*  PLT 413* 344 363  CREATININE 1.22* 1.28* 1.53*   Estimated Creatinine Clearance: 38.3 ml/min (by C-G formula based on Cr of 1.53). No results found for this basename: VANCOTROUGH, Corlis Leak, VANCORANDOM, [place], GENTPEAK, GENTRANDOM, TOBRATROUGH, TOBRAPEAK, TOBRARND, AMIKACINPEAK, AMIKACINTROU, AMIKACIN,  in the last 72 hours   Microbiology: Recent Results (from the past 720 hour(s))  URINE CULTURE     Status: None   Collection Time    07/11/14  5:39 PM      Result Value Ref Range Status   Specimen Description URINE, CLEAN CATCH   Final   Special Requests NONE   Final   Culture  Setup Time     Final   Value: 07/11/2014 22:20     Performed at [facility]     Final   Value: 50,000 COLONIES/ML     Performed at [facility]   Culture     Final   Value: Multiple bacterial morphotypes present, none predominant. Suggest appropriate recollection if clinically indicated.     Performed at [facility]   Report Status 07/12/2014 FINAL   Final  CULTURE, BLOOD (ROUTINE X 2)     Status: None   Collection Time    07/11/14  9:22 PM      Result Value Ref Range Status   Specimen Description  BLOOD RIGHT ARM   Final   Special Requests BOTTLES DRAWN AEROBIC AND ANAEROBIC 5CC   Final   Culture  Setup Time     Final   Value: 07/12/2014 00:30     Performed at [facility]   Culture     Final   Value: NO GROWTH 5 DAYS     Performed at [facility]   Report Status 07/18/2014 FINAL   Final  CULTURE, BLOOD (ROUTINE X 2)     Status: None   Collection Time    07/11/14  9:22 PM      Result Value Ref Range Status   Specimen Description BLOOD RIGHT HAND   Final   Special Requests BOTTLES DRAWN AEROBIC AND ANAEROBIC 5CC   Final   Culture  Setup Time     Final   Value: 07/12/2014 00:30     Performed at [facility]   Culture     Final   Value: NO GROWTH 5 DAYS     Performed at [facility]   Report Status 07/18/2014 FINAL   Final  MRSA PCR SCREENING     Status: None   Collection Time    07/29/14  9:33 AM      Result Value Ref Range Status   MRSA by PCR NEGATIVE  NEGATIVE Final   Comment:  The GeneXpert MRSA Assay (FDA     approved for NASAL specimens     only), is one component of a     comprehensive MRSA colonization     surveillance program. It is not     intended to diagnose MRSA     infection nor to guide or     monitor treatment for     MRSA infections.   Assessment: 9 YOF history of rectal cancer admitted from [facility] with failure to thrive, dehydration and possible cholecystitis. Abdominal US reveals gallstone lodged in GB neck and likely cholecystitis.  [place] also reveals liver lesions  Note to have ARI at time of admission. Pharmacy consulted to dose zosyn.  Patient not a candidate for cholecystectomy, medical management for now.  8/28 >> zosyn  >>  Tmax: afebrile WBCs: WNL Renal: AKI was resolved to baseline, though small bump in SCr yesterday; CrCl now 38 ml/min  No cultures  Goal of Therapy:  Dose for renal function and indication Eradication of infection  Plan:  Day 8 zosyn  Continue Zosyn 3.375g IV q8h extended  infusion for now.  With medical management, will plan for slightly longer course (7-10 d)  F/u SCr, clinical course  Reuel Boom, PharmD Pager: [phone] 08/03/2014, 9:34 AM

## 2014-08-03 NOTE — Progress Notes (Signed)
PARENTERAL NUTRITION CONSULT NOTE - INITIAL  Pharmacy Consult for New TPN Indication: Prolonged ileus  Allergies  Allergen Reactions  . Ointment Base [Lanolin-Petrolatum] Rash    Specifically burn ointments.    Patient Measurements: Height: 5\' 5"  (165.1 cm) Weight:  (Refusal to get up per husband) IBW/kg (Calculated) : 57 Adjusted Body Weight: 62 kg Usual Weight: 73 kg  Vital Signs: Temp: 98.2 F (36.8 C) (09/04 0600) Temp src: Oral (09/04 0600) BP: 131/87 mmHg (09/04 0600) Pulse Rate: 107 (09/04 0600) Intake/Output from previous day: 09/03 0701 - 09/04 0700 In: 1052.2 [I.V.:380; IV Piggyback:150; TPN:522.2] Out: 3150 [Urine:1125; Emesis/NG output:2025] Intake/Output from this shift:    Labs:  Recent Labs  08/01/14 0435 08/02/14 1005 08/03/14 0436  WBC 9.1 8.5 9.4  HGB 9.8* 10.0* 9.6*  HCT 29.3* 29.5* 28.5*  PLT 413* 344 363     Recent Labs  08/01/14 0435 08/02/14 1005 08/03/14 0436  NA 138 139 138  K 3.6* 3.4* 3.1*  CL 92* 93* 90*  CO2 27 31 32  GLUCOSE 94 132* 145*  BUN 19 14 19   CREATININE 1.22* 1.28* 1.53*  CALCIUM 9.1 9.3 9.1  MG  --   --  1.6  PHOS 1.7*  --  4.5  PROT  --   --  6.9  ALBUMIN 2.4*  --  2.5*  AST  --   --  34  ALT  --   --  47*  ALKPHOS  --   --  87  BILITOT  --   --  0.4  TRIG  --   --  170*   Estimated Creatinine Clearance: 38.3 ml/min (by C-G formula based on Cr of 1.53).    Recent Labs  08/02/14 2247 08/03/14 0028 08/03/14 0617  GLUCAP 150* 163* 150*    Medical History: Past Medical History  Diagnosis Date  . Medical history non-contributory   . HDL deficiency   . Allergy     seasonal allergic rhinitis  . History of radiation therapy 10/16/13-11/27/13    rectal 50.4Gy  . Peripheral vascular disease 4/15    DVT LEFT LEG  . Cancer 09/19/13    rectum    Medications:  Infusions:  . Marland KitchenTPN (CLINIMIX-E) Adult 35 mL/hr at 08/02/14 1724   And  . fat emulsion 250 mL (08/02/14 1723)  . sodium chloride 0.9 %  1,000 mL with potassium chloride 20 mEq infusion 40 mL/hr at 08/02/14 2000    Insulin Requirements: 3 units overnight; no Hx DM  Current Nutrition: Ice chips only (no diet orders).  Ordered Boost, but no significant intake other than 240 ml on 9/2  IVF: NS w/ 20 mEq K @ 40 ml/hr  Central access: PICC TPN start date: 9/3  ASSESSMENT                                                                                                          HPI: 63 year old female with history of rectal adenocarcinoma, status post resection and diverting ileostomy in 12/2013 with reversal on 06/07/2014, Recent  hospitalization for small bowel obstruction 07/11/14 through 07/14/2014 who presented from cancer center with N/V, FTT.  Her hospital course was complicated with hyponatremia and acute renal failure, though both appear to be back to baseline at this time.  Ileus/partial SBO has prevented meaningful nutrition over the previous 2-3 weeks and here as well, and pharmacy was consulted to begin TPN.  Significant events:  9/3: noted to have some flatus.  Electrolyte, renal disturbances resolved 9/4: K low despite supplementation yesterday; likely due to hypoMg; phos corrected  Today:   Glucose: 3/4 readings > 150 on TPN (max 163)  Electrolytes - K low and trending down; receiving 10 mEq x 2 this AM.  Hypochloremia likely due to significant G-tube suction.  Mg low-normal; o/w WNL.  Renal - Was at baseline but now trending back up; CrCl 38 ml/min  LFTs - WNL  TGs - 170  Prealbumin - pending  T. Bili wnl  NUTRITIONAL GOALS                                                                                             RD recs:  Kcal: 1700-1800  Protein: 80-90 gm  Fluid: 1.7L daily  Pharmacy to start Clinimix 5/20 at 35 ml/hr and titrate to goal rate of 65 ml/hr with 20% lipids at 10 ml/hr; goal rate would provide 1852 kcal (100% est kcal needs), 78 gram protein (98% est protein needs)  PLAN                                                                                                                          At 1800 today:  Increase Clinimix 5/20 to 50 ml/hr; plan to advance as tolerated to the goal rate.  20% fat emulsion at 10 ml/hr.  TNA to contain standard multivitamins and trace elements.  Reduce IVF to 25 ml/hr.  SSI w/ q6hr checks; will change to moderate resistance scale  TNA lab panels on Mondays & Thursdays.  F/u daily.  In addition,  Give Mg 2g x1  Change IVF from 20 to 40 mEq K  Recheck BMP/Mg tomorrow AM  Reuel Boom, PharmD Pager: [phone] 08/03/2014, 7:47 AM

## 2014-08-03 NOTE — Progress Notes (Addendum)
Patient ID: Victoria [name], female   DOB: [birth year], 63 y.o.   MRN: [id number] [facility] PROGRESS NOTE  BRITTON PERKINSON JJH:[id number] DOB: Mar 01, 1951 DOA: 07/27/2014 PCP: Irven Shelling, MD  Brief narrative: 63 year old female with history of rectal adenocarcinoma, status post radiation and chemotherapy, status post resection and diverting ileostomy in 12/2013, ileostomy reversal on 06/07/2014, history of DVT on anticoagulation with Lovenox, recent hospitalization for small bowel obstruction 07/11/14 through 07/14/2014 who presented from cancer center with nausea and vomiting, failure to thrive. Abdominal US showed possible acute cholecystitis for which she was started on zosyn. In addition, she was found to have severe hyponatremia of 107 but it has improved with IV normal saline. Her creatinine was 4.62 on admission which was thought to be reflective of recent CT abdomen with contrast she had 07/18/2014.  Her hospital course is complicated with ongoing small bowel obstruction.   Assessment & Plan   Principal Problem:  Abdominal distention / Small bowel obstruction  Initial abd x ray showed gaseous distention and NG tube was placed for decompression. Patient continues to have quite a significant output through NG tube. She may require surgery in the next 24 hours if obstruction does not improve. Continue supportive care with IV fluids, analgesia and antiemetics as needed. Appreciate surgery following. No bowel movement yet. Continue Colace, MiraLAX, simethicone for bowel regimen. Active Problems:  Acute cholecystitis  Acute cholecystitis noted on abdominal US. Pt started on empiric zosyn. No significant pain on physical exam.  Continue Zosyn for now. I think we can hold off on HIDA scan for now. Patient remains afebrile and white blood cell count is within normal limits.  No surgical interventions planned at this time per surgery.  Rectal adenocarcinoma with liver  metastases  Recent CT abdomen done 07/18/2014 with findings of new small right pleural effusion; stable 5 mm posterior right upper lobe lung nodule; gallstones noted within the gallbladder; marked gallbladder wall thickening measuring up to 11 mm in thickness at the fundus; abnormal low density noted in the adjacent inferior liver with small scattered satellite nodules in the adjacent liver. Findings within the liver new since the prior study. Small amount of perihepatic fluid adjacent to the right hepatic lobe and gallbladder. Extensive edema/stranding within the pelvis extending from the presacral space anteriorly. New area of sclerosis right pubic bone.  Appreciate oncology following Acute renal failure / Contrast induced nephropathy  Possibly contrast induced nephropathy  Creatinine improved with IV fluids. Most recent creatinine 1.53. Patient was seen by nephrology in consultation. Hypochloremic hyponatremia / metabolic alkalosis / hypokalemia  Likely dehydration, GI losses  Sodium improved with normal saline, 818 --> 563  Metabolic alkalosis resolved. Potassium repleted through IV fluids. Anemia of chronic disease  Likely due to history of malignancy. Hemoglobin is 9.9 this am.  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 Severe protein calorie malnutrition  Nutrition consulted.Has NG tube for abdominal distention.  Started TNA for nutritional support.  DVT prophylaxis  On therapeutic anticoagulation with Lovenox   Code Status: Full.  Family Communication: plan of care discussed with the patient and her husband at the bedside  Disposition Plan: home when stable    IV Access:   Peripheral IV Procedures and diagnostic studies:   US Abdomen Complete 07/28/2014 1. Gallstones with 1 stone measuring 1.7 cm lodged within the gallbladder neck. Wall is thickened to 5.2 mm. Multiple echogenic foci project along the gallbladder wall. This  could reflect a air. Findings support acute cholecystitis in the proper clinical setting. 2. Hypoechoic liver lesions which are nonspecific. They do not appear to be cysts. Largest lies in the right lobe measuring 15 mm. Liver shows a diffusely coarsened increased echotexture consistent with hepatic steatosis. 3. No bile duct dilation.  Abdominal X ray 07/29/2014 - abdominal distention likely due to gaseous distention in stomach. Consider NG tube for decompression.  Dg Abd Portable 1v 07/31/2014 Interval decompression of the stomach with NG tube. Persistent gaseous distention of multiple loops of small bowel. TNA 08/02/2014    Medical Consultants:   Oncology (Dr. Betsy Coder)  Nephrology (Dr. Roney Jaffe)  Surgery (Dr. Leighton Ruff)  Other Consultants:   Nutrition   Anti-Infectives:   Zosyn 07/27/2014 -->   Leisa Lenz, MD  [facility] Pager [phone]  If 7PM-7AM, please contact night-coverage [email or web address] Password [email or web address] 08/03/2014, 11:54 AM   LOS: 7 days    HPI/Subjective: No acute overnight events.  Objective: Filed Vitals:   08/02/14 0552 08/02/14 1348 08/02/14 2152 08/03/14 0600  BP: 131/79 134/90 126/88 131/87  Pulse: 115 115 121 107  Temp: 97.6 F (36.4 C) 97.8 F (36.6 C) 98.2 F (36.8 C) 98.2 F (36.8 C)  TempSrc: Oral Oral Oral Oral  Resp: 20 21 16 16   Height:      Weight:      SpO2: 98% 98% 99% 96%    Intake/Output Summary (Last 24 hours) at 08/03/14 1154 Last data filed at 08/03/14 0600  Gross per 24 hour  Intake 1032.17 ml  Output   2450 ml  Net -1417.83 ml    Exam:   General:  Pt is sleeping this morning, no distress  Cardiovascular: Regular rate and rhythm, S1/S2, no murmurs  Respiratory: No wheezing, no rhonchi or crackles  Abdomen: Firm to palpation, nontender, and G-tube in place  Extremities: No edema, pulses DP and PT palpable bilaterally  Neuro: Grossly nonfocal  Data Reviewed: Basic Metabolic Panel:  Recent Labs Lab  07/27/14 1849  07/30/14 1420 07/31/14 0442 08/01/14 0435 08/02/14 1005 08/03/14 0436  NA 107*  < > 128* 132* 138 139 138  K 4.3  < > 2.5* 3.8 3.6* 3.4* 3.1*  CL <65*  < > 75* 84* 92* 93* 90*  CO2 38*  < > 29 31 27 31  32  GLUCOSE 126*  < > 163* 80 94 132* 145*  BUN 50*  < > 30* 26* 19 14 19   CREATININE 4.62*  < > 1.90* 1.71* 1.22* 1.28* 1.53*  CALCIUM 10.1  < > 7.4* 8.7 9.1 9.3 9.1  MG 1.8  --   --   --   --   --  1.6  PHOS 3.4  --  2.1*  --  1.7*  --  4.5  < > = values in this interval not displayed. Liver Function Tests:  Recent Labs Lab 07/27/14 1849 07/28/14 0624 07/30/14 1420 07/31/14 0442 08/01/14 0435 08/03/14 0436  AST 32 36  --  19  --  34  ALT 29 30  --  15  --  47*  ALKPHOS 103 94  --  76  --  87  BILITOT 0.5 0.6  --  0.4  --  0.4  PROT 9.0* 8.1  --  6.9  --  6.9  ALBUMIN 3.8 3.4* 2.1* 2.5* 2.4* 2.5*   No results found for this basename: LIPASE, AMYLASE,  in the last 168 hours No results found for this basename: AMMONIA,  in the last 168 hours CBC:  Recent Labs Lab 07/27/14 1849 07/28/14 0624 07/31/14 0442 08/01/14 0435 08/02/14 1005 08/03/14 0436  WBC 7.5 7.5 8.5 9.1 8.5 9.4  NEUTROABS 6.4  --   --   --   --  7.6  HGB 11.1* 10.6* 9.9* 9.8* 10.0* 9.6*  HCT 31.2* 29.8* 30.8* 29.3* 29.5* 28.5*  MCV 75.2* 75.1* 80.6 79.0 77.6* 79.2  PLT 467* 411* 376 413* 344 363   Cardiac Enzymes: No results found for this basename: CKTOTAL, CKMB, CKMBINDEX, TROPONINI,  in the last 168 hours BNP: No components found with this basename: POCBNP,  CBG:  Recent Labs Lab 08/02/14 1647 08/02/14 2247 08/03/14 0028 08/03/14 0617 08/03/14 0936  GLUCAP 130* 150* 163* 150* 142*    MRSA PCR SCREENING     Status: None   Collection Time    07/29/14  9:33 AM      Result Value Ref Range Status   MRSA by PCR NEGATIVE  NEGATIVE Final     Scheduled Meds: . docusate sodium  100 mg Oral BID  . insulin aspart  0-15 Units Subcutaneous 4 times per day  . lactose free  nutrition  237 mL Oral TID BM  . piperacillin-tazobactam (ZOSYN)  IV  3.375 g Intravenous Q8H  . polyethylene glycol  17 g Oral BID   Continuous Infusions: . 0.9 % NaCl with KCl 40 mEq / L    . Marland KitchenTPN (CLINIMIX-E) Adult 35 mL/hr at 08/02/14 1724   And  . fat emulsion 250 mL (08/02/14 1723)  . Marland KitchenTPN (CLINIMIX-E) Adult     And  . fat emulsion    . sodium chloride 0.9 % 1,000 mL with potassium chloride 20 mEq infusion 40 mL/hr at 08/02/14 2000

## 2014-08-03 NOTE — Progress Notes (Signed)
NUTRITION FOLLOW UP  Intervention:   -TPN management per pharmacy- monitor refeeding labs- phos, Mg, K and replete as needed (pt at  risk for refeeding d/t poor PO intake > one month and hx of signficant weight loss)  TPN to titrate up to goal of 65 ml/hr with Clinimix 5/20 and 20% lipids at 10 ml/hr. -Diet advancement per MD -D/C Boost  -Will continue to monitor  Nutrition Dx:   Inadequate oral intake related to altered GI function as evidenced by npo and high output per NG.   Goal:   Tolerate diet advancement with intake of meals and supplements to meet >90% estimated needs; not met  New Goal: TPN to meet >/= 90% of their estimated nutrition needs    Monitor:   TPN tolerance, diet order, total protein/energy intake, labs, weights, GI profile  Assessment:   8/30: -Patient currently asleep. Clear when arouses per nurse.  -Unable to tolerate clear liquid diet.  -Now NPO with NG tube in place. 2L output thus far.  -Drinks Boost at home but other intake unknown at this time. -Hx includes severe protein calorie malnutrition.  9/03: -MD noted pt continues with ileus vs pSBO -NGT continues on suction, today output 350 ml. Last BM on 8/30, having flatus -Was seen by RD during previous admit in 07/13/2014, reported poor PO intake for > one month and endorsed an 18 lb weight loss in one month. Had been d/c'd on full liquid/pureed diet w/Boost supplement TID. -However, pt reported only tolerating Boost BID-TID past 2-3 weeks. Consumed some bites of jello, but unable to tolerate puree foods - RD Re-estimated needs d/t weight loss and poor PO -Pt to have PICC, plan to initiate TPN. Pharmacy plan to start Clinimix 5/20 at 35 ml/hr and titrate to goal rate of 65 ml/hr with 20% lipids at 10 ml/hr -Goal rate would provide 1852 kcal (100% est kcal needs), 78 gram protein (98% est protein needs) -Mg Low/Phos WNL on 8/31. K low-being repleted. Pt at risk of refeeding d/t poor PO > one month, and hx  of significant weight loss. Monitor and replete as needed -CBGs < 150 mg/dl  9/4: -NG tube in place for decompression.  Significant output through NG tube and may need surger in the next 24 hours if obstruction does not improve per MD. -TPN at 35 ml/hr with Clinimix 5/20 .  TPN to titrate to goal rate of 65 ml/hr with 20% lipids at 10 ml/hr which would provide:  1852 kcal (100% est kcal needs, 78 gm protein (98% est protein needs).   -Labs reviewed.  Potassium 3.1 being repleted.  Magnesium 1.6 and Phosphorus 4.5 wnl.    Height: Ht Readings from Last 1 Encounters:  07/29/14 5' 5"  (1.651 m)    Weight Status:   Wt Readings from Last 1 Encounters:  08/02/14 162 lb 5 oz (73.624 kg)  07/29/14 152 lbs  Re-estimated needs:  Kcal: 1700-1800  Protein: 80-90 gm  Fluid: 1.7L daily   Diet Order:  none   Intake/Output Summary (Last 24 hours) at 08/03/14 1205 Last data filed at 08/03/14 0600  Gross per 24 hour  Intake 1032.17 ml  Output   2450 ml  Net -1417.83 ml    Last BM: 8/30   Labs:   Recent Labs Lab 07/27/14 1849  07/30/14 1420  08/01/14 0435 08/02/14 1005 08/03/14 0436  NA 107*  < > 128*  < > 138 139 138  K 4.3  < > 2.5*  < >  3.6* 3.4* 3.1*  CL <65*  < > 75*  < > 92* 93* 90*  CO2 38*  < > 29  < > 27 31 32  BUN 50*  < > 30*  < > 19 14 19   CREATININE 4.62*  < > 1.90*  < > 1.22* 1.28* 1.53*  CALCIUM 10.1  < > 7.4*  < > 9.1 9.3 9.1  MG 1.8  --   --   --   --   --  1.6  PHOS 3.4  --  2.1*  --  1.7*  --  4.5  GLUCOSE 126*  < > 163*  < > 94 132* 145*  < > = values in this interval not displayed.  CBG (last 3)   Recent Labs  08/03/14 0028 08/03/14 0617 08/03/14 0936  GLUCAP 163* 150* 142*    Scheduled Meds: . antiseptic oral rinse  7 mL Mouth Rinse q12n4p  . chlorhexidine  15 mL Mouth Rinse BID  . cycloSPORINE  1 drop Both Eyes BID  . docusate sodium  100 mg Oral BID  . insulin aspart  0-15 Units Subcutaneous 4 times per day  . lactose free nutrition  237  mL Oral TID BM  . piperacillin-tazobactam (ZOSYN)  IV  3.375 g Intravenous Q8H  . polyethylene glycol  17 g Oral BID  . sodium chloride  10-40 mL Intracatheter Q12H    Continuous Infusions: . 0.9 % NaCl with KCl 40 mEq / L    . Marland KitchenTPN (CLINIMIX-E) Adult 35 mL/hr at 08/02/14 1724   And  . fat emulsion 250 mL (08/02/14 1723)  . Marland KitchenTPN (CLINIMIX-E) Adult     And  . fat emulsion    . sodium chloride 0.9 % 1,000 mL with potassium chloride 20 mEq infusion 40 mL/hr at 08/02/14 2000   Antonieta Iba, RD, LDN Clinical Inpatient Dietitian Pager:  [phone] Weekend and after hours pager:  [phone]

## 2014-08-03 NOTE — Progress Notes (Signed)
Adult failure to thrive  Subjective: Pt still with ileus/pSBO.  TPN started.  HR rising over the last 24-48h.  Ambulated some yesterday. Objective: Vital signs in last 24 hours: Temp:  [97.8 F (36.6 C)-98.2 F (36.8 C)] 98.2 F (36.8 C) (09/04 0600) Pulse Rate:  [107-121] 107 (09/04 0600) Resp:  [16-21] 16 (09/04 0600) BP: (126-134)/(87-90) 131/87 mmHg (09/04 0600) SpO2:  [96 %-99 %] 96 % (09/04 0600) Last BM Date: 07/29/14  Intake/Output from previous day: 09/03 0701 - 09/04 0700 In: 1052.2 [I.V.:380; IV Piggyback:150; TPN:522.2] Out: 3150 [Urine:1125; Emesis/NG output:2025] Intake/Output this shift:    General appearance: alert and cooperative GI: normal findings: soft, non-distended   Lab Results:  Results for orders placed during the hospital encounter of 07/27/14 (from the past 24 hour(s))  GLUCOSE, CAPILLARY     Status: Abnormal   Collection Time    08/02/14  7:47 AM      Result Value Ref Range   Glucose-Capillary 136 (*) 70 - 99 mg/dL  BASIC METABOLIC PANEL     Status: Abnormal   Collection Time    08/02/14 10:05 AM      Result Value Ref Range   Sodium 139  137 - 147 mEq/L   Potassium 3.4 (*) 3.7 - 5.3 mEq/L   Chloride 93 (*) 96 - 112 mEq/L   CO2 31  19 - 32 mEq/L   Glucose, Bld 132 (*) 70 - 99 mg/dL   BUN 14  6 - 23 mg/dL   Creatinine, Ser 1.28 (*) 0.50 - 1.10 mg/dL   Calcium 9.3  8.4 - 10.5 mg/dL   GFR calc non Af Amer 44 (*) >90 mL/min   GFR calc Af Amer 51 (*) >90 mL/min   Anion gap 15  5 - 15  CBC     Status: Abnormal   Collection Time    08/02/14 10:05 AM      Result Value Ref Range   WBC 8.5  4.0 - 10.5 K/uL   RBC 3.80 (*) 3.87 - 5.11 MIL/uL   Hemoglobin 10.0 (*) 12.0 - 15.0 g/dL   HCT 29.5 (*) 36.0 - 46.0 %   MCV 77.6 (*) 78.0 - 100.0 fL   MCH 26.3  26.0 - 34.0 pg   MCHC 33.9  30.0 - 36.0 g/dL   RDW 15.5  11.5 - 15.5 %   Platelets 344  150 - 400 K/uL  GLUCOSE, CAPILLARY     Status: Abnormal   Collection Time    08/02/14  4:47 PM   Result Value Ref Range   Glucose-Capillary 130 (*) 70 - 99 mg/dL  GLUCOSE, CAPILLARY     Status: Abnormal   Collection Time    08/02/14 10:47 PM      Result Value Ref Range   Glucose-Capillary 150 (*) 70 - 99 mg/dL  GLUCOSE, CAPILLARY     Status: Abnormal   Collection Time    08/03/14 12:28 AM      Result Value Ref Range   Glucose-Capillary 163 (*) 70 - 99 mg/dL  CBC     Status: Abnormal   Collection Time    08/03/14  4:36 AM      Result Value Ref Range   WBC 9.4  4.0 - 10.5 K/uL   RBC 3.60 (*) 3.87 - 5.11 MIL/uL   Hemoglobin 9.6 (*) 12.0 - 15.0 g/dL   HCT 28.5 (*) 36.0 - 46.0 %   MCV 79.2  78.0 - 100.0 fL   MCH 26.7  26.0 - 34.0 pg   MCHC 33.7  30.0 - 36.0 g/dL   RDW 15.7 (*) 11.5 - 15.5 %   Platelets 363  150 - 400 K/uL  COMPREHENSIVE METABOLIC PANEL     Status: Abnormal   Collection Time    08/03/14  4:36 AM      Result Value Ref Range   Sodium 138  137 - 147 mEq/L   Potassium 3.1 (*) 3.7 - 5.3 mEq/L   Chloride 90 (*) 96 - 112 mEq/L   CO2 32  19 - 32 mEq/L   Glucose, Bld 145 (*) 70 - 99 mg/dL   BUN 19  6 - 23 mg/dL   Creatinine, Ser 1.53 (*) 0.50 - 1.10 mg/dL   Calcium 9.1  8.4 - 10.5 mg/dL   Total Protein 6.9  6.0 - 8.3 g/dL   Albumin 2.5 (*) 3.5 - 5.2 g/dL   AST 34  0 - 37 U/L   ALT 47 (*) 0 - 35 U/L   Alkaline Phosphatase 87  39 - 117 U/L   Total Bilirubin 0.4  0.3 - 1.2 mg/dL   GFR calc non Af Amer 35 (*) >90 mL/min   GFR calc Af Amer 41 (*) >90 mL/min   Anion gap 16 (*) 5 - 15  MAGNESIUM     Status: None   Collection Time    08/03/14  4:36 AM      Result Value Ref Range   Magnesium 1.6  1.5 - 2.5 mg/dL  PHOSPHORUS     Status: None   Collection Time    08/03/14  4:36 AM      Result Value Ref Range   Phosphorus 4.5  2.3 - 4.6 mg/dL  TRIGLYCERIDES     Status: Abnormal   Collection Time    08/03/14  4:36 AM      Result Value Ref Range   Triglycerides 170 (*) <150 mg/dL  DIFFERENTIAL     Status: Abnormal   Collection Time    08/03/14  4:36 AM       Result Value Ref Range   Neutrophils Relative % 80 (*) 43 - 77 %   Lymphocytes Relative 9 (*) 12 - 46 %   Monocytes Relative 11  3 - 12 %   Eosinophils Relative 0  0 - 5 %   Basophils Relative 0  0 - 1 %   Neutro Abs 7.6  1.7 - 7.7 K/uL   Lymphs Abs 0.8  0.7 - 4.0 K/uL   Monocytes Absolute 1.0  0.1 - 1.0 K/uL   Eosinophils Absolute 0.0  0.0 - 0.7 K/uL   Basophils Absolute 0.0  0.0 - 0.1 K/uL   RBC Morphology TARGET CELLS     WBC Morphology MILD LEFT SHIFT (1-5% METAS, OCC MYELO, OCC BANDS)    GLUCOSE, CAPILLARY     Status: Abnormal   Collection Time    08/03/14  6:17 AM      Result Value Ref Range   Glucose-Capillary 150 (*) 70 - 99 mg/dL     Studies/Results Radiology     MEDS, Scheduled . antiseptic oral rinse  7 mL Mouth Rinse q12n4p  . chlorhexidine  15 mL Mouth Rinse BID  . cycloSPORINE  1 drop Both Eyes BID  . docusate sodium  100 mg Oral BID  . enoxaparin (LOVENOX) injection  100 mg Subcutaneous Q24H  . insulin aspart  0-9 Units Subcutaneous Q6H  . lactose free nutrition  237 mL Oral TID  BM  . piperacillin-tazobactam (ZOSYN)  IV  3.375 g Intravenous Q8H  . polyethylene glycol  17 g Oral BID  . potassium chloride  10 mEq Intravenous Q1 Hr x 2  . sodium chloride  10-40 mL Intracatheter Q12H     Assessment: Adult failure to thrive Ileus vs pSBO  Plan: Cont TPN May need laparoscopy evaluation with possible liver biopsy, if she does not open up within the next 24h.  Family is not ready to consider surgery today.     LOS: 7 days    Rosario Adie, [facility], [place]   08/03/2014 7:35 AM

## 2014-08-03 NOTE — Progress Notes (Signed)
[employer] for Lovenox Indication: History of DVT  Allergies  Allergen Reactions  . Ointment Base [Lanolin-Petrolatum] Rash    Specifically burn ointments.    Patient Measurements: Height: 5\' 5"  (165.1 cm) Weight:  (Refusal to get up per husband) IBW/kg (Calculated) : 57 Heparin Dosing Weight:   Vital Signs: Temp: 98.2 F (36.8 C) (09/04 0600) Temp src: Oral (09/04 0600) BP: 131/87 mmHg (09/04 0600) Pulse Rate: 107 (09/04 0600)  Labs:  Recent Labs  08/01/14 0435 08/02/14 1005 08/03/14 0436  HGB 9.8* 10.0* 9.6*  HCT 29.3* 29.5* 28.5*  PLT 413* 344 363  CREATININE 1.22* 1.28* 1.53*    Estimated Creatinine Clearance: 38.3 ml/min (by C-G formula based on Cr of 1.53).   Medical History: Past Medical History  Diagnosis Date  . Medical history non-contributory   . HDL deficiency   . Allergy     seasonal allergic rhinitis  . History of radiation therapy 10/16/13-11/27/13    rectal 50.4Gy  . Peripheral vascular disease 4/15    DVT LEFT LEG  . Cancer 09/19/13    rectum    Medications:  Scheduled:  . antiseptic oral rinse  7 mL Mouth Rinse q12n4p  . chlorhexidine  15 mL Mouth Rinse BID  . cycloSPORINE  1 drop Both Eyes BID  . docusate sodium  100 mg Oral BID  . enoxaparin (LOVENOX) injection  100 mg Subcutaneous Q24H  . insulin aspart  0-15 Units Subcutaneous 4 times per day  . lactose free nutrition  237 mL Oral TID BM  . magnesium sulfate 1 - 4 g bolus IVPB  2 g Intravenous Once  . piperacillin-tazobactam (ZOSYN)  IV  3.375 g Intravenous Q8H  . polyethylene glycol  17 g Oral BID  . potassium chloride  10 mEq Intravenous Q1 Hr x 2  . sodium chloride  10-40 mL Intracatheter Q12H   Infusions:  . 0.9 % NaCl with KCl 40 mEq / L    . Marland KitchenTPN (CLINIMIX-E) Adult 35 mL/hr at 08/02/14 1724   And  . fat emulsion 250 mL (08/02/14 1723)  . Marland KitchenTPN (CLINIMIX-E) Adult     And  . fat emulsion    . sodium chloride 0.9 % 1,000 mL with  potassium chloride 20 mEq infusion 40 mL/hr at 08/02/14 2000   PRN: acetaminophen, acetaminophen, HYDROcodone-acetaminophen, ipratropium-albuterol, magnesium hydroxide, morphine injection, ondansetron (ZOFRAN) IV, simethicone, sodium chloride  Assessment: Pt with h/o rectal cancer and now with liver mets was admitted from cancer center for failure to thrive and dehydration. Pt was on Lovenox 100mg  SQ Q24h PTA for L leg DVT diagnosed in 03/15/14 with last dose 8/27@1730 .  Noted to be in AKI at admission and lovenox dosed on anti-Xa levels.  Renal function resolved to baseline on 9/3, but SCr did increase last night, however.  Also, patient has prolonged ileus/pSBO on TPN.  Per Surgery, may need to perform laparoscopic evaluation or liver Bx tomorrow.  Spoke with Dr. Marcello Moores; would like to hold lovenox starting tonight 9/4.   CBC wnl, stable  SCr slight bump to 1.5 last night; CrCl 38 ml/min  Goal of Therapy:  Monitor PLT with anticoagulation protocol: Yes   Plan:   Hold lovenox starting tonight  F/u with surgery for resumption  Follow renal function, CBC   Reuel Boom, PharmD Pager: [phone] 08/03/2014, 9:19 AM

## 2014-08-04 ENCOUNTER — Encounter (HOSPITAL_COMMUNITY): Payer: BC Managed Care – PPO | Admitting: Anesthesiology

## 2014-08-04 ENCOUNTER — Encounter (HOSPITAL_COMMUNITY)
Admission: AD | Disposition: A | Discharge status: Hospice | Payer: Self-pay | Source: Ambulatory Visit | Attending: Internal Medicine

## 2014-08-04 ENCOUNTER — Inpatient Hospital Stay (HOSPITAL_COMMUNITY): Payer: BC Managed Care – PPO

## 2014-08-04 ENCOUNTER — Inpatient Hospital Stay (HOSPITAL_COMMUNITY): Payer: BC Managed Care – PPO | Admitting: Anesthesiology

## 2014-08-04 DIAGNOSIS — K56609 Unspecified intestinal obstruction, unspecified as to partial versus complete obstruction: Secondary | ICD-10-CM | POA: Diagnosis present

## 2014-08-04 DIAGNOSIS — Z931 Gastrostomy status: Secondary | ICD-10-CM

## 2014-08-04 HISTORY — PX: LAPAROSCOPIC SMALL BOWEL RESECTION: SHX5929

## 2014-08-04 LAB — BASIC METABOLIC PANEL
Anion gap: 14 (ref 5–15)
BUN: 24 mg/dL — ABNORMAL HIGH (ref 6–23)
CO2: 34 mEq/L — ABNORMAL HIGH (ref 19–32)
CREATININE: 1.5 mg/dL — AB (ref 0.50–1.10)
Calcium: 9.5 mg/dL (ref 8.4–10.5)
Chloride: 90 mEq/L — ABNORMAL LOW (ref 96–112)
GFR, EST AFRICAN AMERICAN: 42 mL/min — AB (ref >90)
GFR, EST NON AFRICAN AMERICAN: 36 mL/min — AB (ref >90)
Glucose, Bld: 150 mg/dL — ABNORMAL HIGH (ref 70–99)
Potassium: 3.3 mEq/L — ABNORMAL LOW (ref 3.7–5.3)
Sodium: 138 mEq/L (ref 137–147)

## 2014-08-04 LAB — GLUCOSE, CAPILLARY
GLUCOSE-CAPILLARY: 152 mg/dL — AB (ref 70–99)
GLUCOSE-CAPILLARY: 99 mg/dL (ref 70–99)
Glucose-Capillary: 101 mg/dL — ABNORMAL HIGH (ref 70–99)
Glucose-Capillary: 156 mg/dL — ABNORMAL HIGH (ref 70–99)
Glucose-Capillary: 164 mg/dL — ABNORMAL HIGH (ref 70–99)

## 2014-08-04 LAB — TYPE AND SCREEN
ABO/RH(D): AB NEG
Antibody Screen: NEGATIVE

## 2014-08-04 LAB — MRSA PCR SCREENING: MRSA BY PCR: NEGATIVE

## 2014-08-04 LAB — MAGNESIUM: Magnesium: 2.3 mg/dL (ref 1.5–2.5)

## 2014-08-04 SURGERY — EXCISION, SMALL INTESTINE, LAPAROSCOPIC
Anesthesia: General | Site: Abdomen

## 2014-08-04 MED ORDER — ONDANSETRON HCL 4 MG/2ML IJ SOLN
INTRAMUSCULAR | Status: AC
  Filled 2014-08-04: qty 2

## 2014-08-04 MED ORDER — ONDANSETRON HCL 4 MG/2ML IJ SOLN: 4.0000 mg | Freq: Four times a day (QID) | INTRAMUSCULAR | Status: DC | PRN

## 2014-08-04 MED ORDER — SODIUM CHLORIDE 0.9 % IJ SOLN: 9.0000 mL | INTRAMUSCULAR | Status: DC | PRN

## 2014-08-04 MED ORDER — FENTANYL CITRATE 0.05 MG/ML IJ SOLN
INTRAMUSCULAR | Status: DC | PRN
  Administered 2014-08-04 (×2): 25 ug via INTRAVENOUS
  Administered 2014-08-04 (×2): 50 ug via INTRAVENOUS

## 2014-08-04 MED ORDER — METHOCARBAMOL 1000 MG/10ML IJ SOLN
500.0000 mg | Freq: Four times a day (QID) | INTRAVENOUS | Status: DC | PRN
  Filled 2014-08-04: qty 5

## 2014-08-04 MED ORDER — BUPIVACAINE-EPINEPHRINE 0.25% -1:200000 IJ SOLN
INTRAMUSCULAR | Status: DC | PRN
  Administered 2014-08-04: 50 mL

## 2014-08-04 MED ORDER — PIPERACILLIN-TAZOBACTAM 3.375 G IVPB
3.3750 g | Freq: Three times a day (TID) | INTRAVENOUS | Status: DC
  Administered 2014-08-04 – 2014-08-05 (×2): 3.375 g via INTRAVENOUS
  Filled 2014-08-04 (×2): qty 50

## 2014-08-04 MED ORDER — FENTANYL CITRATE 0.05 MG/ML IJ SOLN
INTRAMUSCULAR | Status: AC
  Filled 2014-08-04: qty 5

## 2014-08-04 MED ORDER — ROCURONIUM BROMIDE 100 MG/10ML IV SOLN
INTRAVENOUS | Status: DC | PRN
  Administered 2014-08-04: 5 mg via INTRAVENOUS
  Administered 2014-08-04: 30 mg via INTRAVENOUS

## 2014-08-04 MED ORDER — SUCCINYLCHOLINE CHLORIDE 20 MG/ML IJ SOLN
INTRAMUSCULAR | Status: DC | PRN
  Administered 2014-08-04: 100 mg via INTRAVENOUS

## 2014-08-04 MED ORDER — NEOSTIGMINE METHYLSULFATE 10 MG/10ML IV SOLN
INTRAVENOUS | Status: DC | PRN
  Administered 2014-08-04: 4 mg via INTRAVENOUS

## 2014-08-04 MED ORDER — PROPOFOL 10 MG/ML IV BOLUS
INTRAVENOUS | Status: DC | PRN
  Administered 2014-08-04: 100 mg via INTRAVENOUS

## 2014-08-04 MED ORDER — LIDOCAINE HCL (CARDIAC) 20 MG/ML IV SOLN
INTRAVENOUS | Status: DC | PRN
  Administered 2014-08-04: 100 mg via INTRAVENOUS

## 2014-08-04 MED ORDER — NEOSTIGMINE METHYLSULFATE 10 MG/10ML IV SOLN
INTRAVENOUS | Status: AC
  Filled 2014-08-04: qty 1

## 2014-08-04 MED ORDER — LIDOCAINE HCL (CARDIAC) 20 MG/ML IV SOLN
INTRAVENOUS | Status: AC
  Filled 2014-08-04: qty 5

## 2014-08-04 MED ORDER — FAT EMULSION 20 % IV EMUL
250.0000 mL | INTRAVENOUS | Status: AC
  Administered 2014-08-04: 250 mL via INTRAVENOUS
  Filled 2014-08-04: qty 250

## 2014-08-04 MED ORDER — POTASSIUM CHLORIDE IN NACL 40-0.9 MEQ/L-% IV SOLN
INTRAVENOUS | Status: AC
  Administered 2014-08-05: 10 mL/h via INTRAVENOUS
  Filled 2014-08-04 (×3): qty 1000

## 2014-08-04 MED ORDER — PHENYLEPHRINE HCL 10 MG/ML IJ SOLN
INTRAMUSCULAR | Status: DC | PRN
  Administered 2014-08-04 (×2): 80 ug via INTRAVENOUS

## 2014-08-04 MED ORDER — SODIUM CHLORIDE 0.9 % IV SOLN
INTRAVENOUS | Status: DC | PRN
  Administered 2014-08-04 (×2): via INTRAVENOUS

## 2014-08-04 MED ORDER — PROPOFOL 10 MG/ML IV BOLUS
INTRAVENOUS | Status: AC
  Filled 2014-08-04: qty 20

## 2014-08-04 MED ORDER — LACTATED RINGERS IR SOLN
Status: DC | PRN
  Administered 2014-08-04: 3000 mL

## 2014-08-04 MED ORDER — HYDROMORPHONE 0.3 MG/ML IV SOLN
INTRAVENOUS | Status: DC
  Administered 2014-08-04: 19:00:00 via INTRAVENOUS
  Administered 2014-08-04: 0.3 mg via INTRAVENOUS
  Administered 2014-08-05: 0.6 mg via INTRAVENOUS
  Administered 2014-08-05: 0.3 mg via INTRAVENOUS
  Administered 2014-08-05 (×2): 0.6 mg via INTRAVENOUS
  Administered 2014-08-05: 1.5 mg via INTRAVENOUS
  Administered 2014-08-05: 0.6 mg via INTRAVENOUS
  Administered 2014-08-06 (×2): 0.9 mg via INTRAVENOUS
  Administered 2014-08-06: 08:00:00 via INTRAVENOUS
  Administered 2014-08-06: 1.8 mg via INTRAVENOUS
  Administered 2014-08-06: 0.6 mg via INTRAVENOUS
  Administered 2014-08-06: 2.1 mg via INTRAVENOUS
  Administered 2014-08-06: 1.03 mg via INTRAVENOUS
  Administered 2014-08-07: 4.2 mg via INTRAVENOUS
  Administered 2014-08-07: 02:00:00 via INTRAVENOUS
  Administered 2014-08-07: 2.4 mg via INTRAVENOUS
  Administered 2014-08-07: 3.45 mg via INTRAVENOUS
  Administered 2014-08-07: 1.2 mg via INTRAVENOUS
  Administered 2014-08-07: 1.8 mg via INTRAVENOUS
  Administered 2014-08-07: 1.77 mg via INTRAVENOUS
  Administered 2014-08-08: 13:00:00 via INTRAVENOUS
  Administered 2014-08-08: 3.3 mg via INTRAVENOUS
  Administered 2014-08-08: 21:00:00 via INTRAVENOUS
  Administered 2014-08-08: 2 mg via INTRAVENOUS
  Administered 2014-08-08: 2.96 mg via INTRAVENOUS
  Administered 2014-08-08: 3.17 mg via INTRAVENOUS
  Administered 2014-08-08: 2.7 mg via INTRAVENOUS
  Administered 2014-08-08: 1.8 mg via INTRAVENOUS
  Administered 2014-08-09: 2.29 mg via INTRAVENOUS
  Administered 2014-08-09: 1.8 mg via INTRAVENOUS
  Administered 2014-08-09: 3.9 mg via INTRAVENOUS
  Administered 2014-08-09: 08:00:00 via INTRAVENOUS
  Filled 2014-08-04 (×7): qty 25

## 2014-08-04 MED ORDER — VITAMINS A & D EX OINT
TOPICAL_OINTMENT | CUTANEOUS | Status: AC
  Administered 2014-08-04: 5
  Filled 2014-08-04: qty 5

## 2014-08-04 MED ORDER — POTASSIUM CHLORIDE 10 MEQ/100ML IV SOLN
10.0000 meq | INTRAVENOUS | Status: AC
  Administered 2014-08-04 (×4): 10 meq via INTRAVENOUS
  Filled 2014-08-04 (×4): qty 100

## 2014-08-04 MED ORDER — BUPIVACAINE-EPINEPHRINE 0.25% -1:200000 IJ SOLN
INTRAMUSCULAR | Status: AC
  Filled 2014-08-04: qty 1

## 2014-08-04 MED ORDER — ACETAMINOPHEN 650 MG RE SUPP: 650.0000 mg | Freq: Four times a day (QID) | RECTAL | Status: DC | PRN

## 2014-08-04 MED ORDER — ALBUMIN HUMAN 25 % IV SOLN
12.5000 g | Freq: Once | INTRAVENOUS | Status: AC
  Administered 2014-08-04: 16:00:00 via INTRAVENOUS
  Filled 2014-08-04: qty 50

## 2014-08-04 MED ORDER — LACTATED RINGERS IV BOLUS (SEPSIS): 1000.0000 mL | Freq: Three times a day (TID) | INTRAVENOUS | Status: AC | PRN

## 2014-08-04 MED ORDER — DIPHENHYDRAMINE HCL 50 MG/ML IJ SOLN
12.5000 mg | Freq: Four times a day (QID) | INTRAMUSCULAR | Status: DC | PRN
  Administered 2014-08-13: 12.5 mg via INTRAVENOUS
  Filled 2014-08-04: qty 1

## 2014-08-04 MED ORDER — METHOCARBAMOL 1000 MG/10ML IJ SOLN
1000.0000 mg | Freq: Four times a day (QID) | INTRAVENOUS | Status: DC | PRN
  Filled 2014-08-04: qty 10

## 2014-08-04 MED ORDER — TRACE MINERALS CR-CU-F-FE-I-MN-MO-SE-ZN IV SOLN
INTRAVENOUS | Status: AC
  Administered 2014-08-04: 23:00:00 via INTRAVENOUS
  Filled 2014-08-04: qty 2000

## 2014-08-04 MED ORDER — METHOCARBAMOL 1000 MG/10ML IJ SOLN: 500.0000 mg | Freq: Four times a day (QID) | INTRAVENOUS | Status: DC | PRN

## 2014-08-04 MED ORDER — HYDROMORPHONE HCL PF 1 MG/ML IJ SOLN: 0.5000 mg | INTRAMUSCULAR | Status: DC | PRN

## 2014-08-04 MED ORDER — NALOXONE HCL 0.4 MG/ML IJ SOLN: 0.4000 mg | INTRAMUSCULAR | Status: DC | PRN

## 2014-08-04 MED ORDER — ONDANSETRON HCL 4 MG/2ML IJ SOLN
INTRAMUSCULAR | Status: DC | PRN
  Administered 2014-08-04: 4 mg via INTRAVENOUS

## 2014-08-04 MED ORDER — HYDROMORPHONE 0.3 MG/ML IV SOLN
INTRAVENOUS | Status: AC
  Filled 2014-08-04: qty 25

## 2014-08-04 MED ORDER — ROCURONIUM BROMIDE 100 MG/10ML IV SOLN
INTRAVENOUS | Status: AC
  Filled 2014-08-04: qty 1

## 2014-08-04 MED ORDER — GLYCOPYRROLATE 0.2 MG/ML IJ SOLN
INTRAMUSCULAR | Status: DC | PRN
  Administered 2014-08-04: 0.6 mg via INTRAVENOUS

## 2014-08-04 MED ORDER — HYDROMORPHONE HCL PF 1 MG/ML IJ SOLN: 0.2500 mg | INTRAMUSCULAR | Status: DC | PRN

## 2014-08-04 MED ORDER — GLYCOPYRROLATE 0.2 MG/ML IJ SOLN
INTRAMUSCULAR | Status: AC
  Filled 2014-08-04: qty 3

## 2014-08-04 MED ORDER — PROMETHAZINE HCL 25 MG/ML IJ SOLN
6.2500 mg | INTRAMUSCULAR | Status: DC | PRN
Start: 2014-08-04 — End: 2014-08-04

## 2014-08-04 MED ORDER — 0.9 % SODIUM CHLORIDE (POUR BTL) OPTIME
TOPICAL | Status: DC | PRN
  Administered 2014-08-04: 1000 mL

## 2014-08-04 SURGICAL SUPPLY — 80 items
APPLIER CLIP 5 13 M/L LIGAMAX5 (MISCELLANEOUS)
APPLIER CLIP ROT 10 11.4 M/L (STAPLE)
BAG URINE DRAINAGE (UROLOGICAL SUPPLIES) ×3 IMPLANT
BLADE EXTENDED COATED 6.5IN (ELECTRODE) ×3 IMPLANT
BLADE HEX COATED 2.75 (ELECTRODE) ×6 IMPLANT
BLADE SURG SZ10 CARB STEEL (BLADE) ×3 IMPLANT
CABLE HIGH FREQUENCY MONO STRZ (ELECTRODE) ×3 IMPLANT
CANISTER SUCTION 2500CC (MISCELLANEOUS) IMPLANT
CATH KIT ON Q 7.5IN SLV (PAIN MANAGEMENT) IMPLANT
CELLS DAT CNTRL 66122 CELL SVR (MISCELLANEOUS) IMPLANT
CLIP APPLIE 5 13 M/L LIGAMAX5 (MISCELLANEOUS) IMPLANT
CLIP APPLIE ROT 10 11.4 M/L (STAPLE) IMPLANT
COUNTER NEEDLE 20 DBL MAG RED (NEEDLE) ×3 IMPLANT
COVER MAYO STAND STRL (DRAPES) ×6 IMPLANT
DECANTER SPIKE VIAL GLASS SM (MISCELLANEOUS) ×3 IMPLANT
DEVICE TROCAR PUNCTURE CLOSURE (ENDOMECHANICALS) ×3 IMPLANT
DRAIN CHANNEL 19F RND (DRAIN) IMPLANT
DRAPE LAPAROSCOPIC ABDOMINAL (DRAPES) ×3 IMPLANT
DRAPE LG THREE QUARTER DISP (DRAPES) ×6 IMPLANT
DRAPE UTILITY XL STRL (DRAPES) ×6 IMPLANT
DRAPE WARM FLUID 44X44 (DRAPE) IMPLANT
DRSG OPSITE POSTOP 4X10 (GAUZE/BANDAGES/DRESSINGS) IMPLANT
DRSG OPSITE POSTOP 4X6 (GAUZE/BANDAGES/DRESSINGS) IMPLANT
DRSG OPSITE POSTOP 4X8 (GAUZE/BANDAGES/DRESSINGS) IMPLANT
DRSG TEGADERM 2-3/8X2-3/4 SM (GAUZE/BANDAGES/DRESSINGS) ×12 IMPLANT
DRSG TEGADERM 4X4.75 (GAUZE/BANDAGES/DRESSINGS) ×6 IMPLANT
ELECT REM PT RETURN 9FT ADLT (ELECTROSURGICAL) ×3
ELECTRODE REM PT RTRN 9FT ADLT (ELECTROSURGICAL) ×1 IMPLANT
ENDOLOOP SUT PDS II  0 18 (SUTURE)
ENDOLOOP SUT PDS II 0 18 (SUTURE) IMPLANT
ENDOVIVE SAFTEY PEG KIT 24FR IMPLANT
GAUZE SPONGE 2X2 8PLY STRL LF (GAUZE/BANDAGES/DRESSINGS) ×1 IMPLANT
GAUZE SPONGE 4X4 12PLY STRL (GAUZE/BANDAGES/DRESSINGS) ×3 IMPLANT
GLOVE ECLIPSE 8.0 STRL XLNG CF (GLOVE) ×6 IMPLANT
GLOVE INDICATOR 8.0 STRL GRN (GLOVE) ×6 IMPLANT
GOWN STRL REUS W/TWL XL LVL3 (GOWN DISPOSABLE) ×12 IMPLANT
KIT BASIN OR (CUSTOM PROCEDURE TRAY) ×3 IMPLANT
LEGGING LITHOTOMY PAIR STRL (DRAPES) IMPLANT
LUBRICANT JELLY K Y 4OZ (MISCELLANEOUS) IMPLANT
PENCIL BUTTON HOLSTER BLD 10FT (ELECTRODE) ×6 IMPLANT
RTRCTR WOUND ALEXIS 18CM MED (MISCELLANEOUS)
SCISSORS LAP 5X35 DISP (ENDOMECHANICALS) ×3 IMPLANT
SEALER TISSUE G2 CVD JAW 35 (ENDOMECHANICALS) IMPLANT
SEALER TISSUE G2 CVD JAW 45CM (ENDOMECHANICALS)
SEALER TISSUE G2 STRG ARTC 35C (ENDOMECHANICALS) IMPLANT
SET IRRIG TUBING LAPAROSCOPIC (IRRIGATION / IRRIGATOR) ×3 IMPLANT
SLEEVE XCEL OPT CAN 5 100 (ENDOMECHANICALS) ×6 IMPLANT
SPONGE DRAIN TRACH 4X4 STRL 2S (GAUZE/BANDAGES/DRESSINGS) ×3 IMPLANT
SPONGE GAUZE 2X2 STER 10/PKG (GAUZE/BANDAGES/DRESSINGS) ×2
SPONGE LAP 18X18 X RAY DECT (DISPOSABLE) ×6 IMPLANT
STAPLER VISISTAT 35W (STAPLE) ×3 IMPLANT
SUCTION POOLE TIP (SUCTIONS) ×3 IMPLANT
SUT MNCRL AB 4-0 PS2 18 (SUTURE) ×3 IMPLANT
SUT PDS AB 1 CTX 36 (SUTURE) IMPLANT
SUT PDS AB 1 TP1 96 (SUTURE) IMPLANT
SUT PDS AB 2-0 CT2 27 (SUTURE) ×3 IMPLANT
SUT PROLENE 0 CT 2 (SUTURE) IMPLANT
SUT PROLENE 2 0 SH DA (SUTURE) ×21 IMPLANT
SUT SILK 2 0 (SUTURE) ×2
SUT SILK 2 0 SH CR/8 (SUTURE) ×3 IMPLANT
SUT SILK 2-0 18XBRD TIE 12 (SUTURE) ×1 IMPLANT
SUT SILK 3 0 (SUTURE) ×2
SUT SILK 3 0 SH CR/8 (SUTURE) ×3 IMPLANT
SUT SILK 3-0 18XBRD TIE 12 (SUTURE) ×1 IMPLANT
SYR BULB IRRIGATION 50ML (SYRINGE) ×3 IMPLANT
SYS LAPSCP GELPORT 120MM (MISCELLANEOUS)
SYSTEM LAPSCP GELPORT 120MM (MISCELLANEOUS) IMPLANT
TAPE CLOTH SURG 4X10 WHT LF (GAUZE/BANDAGES/DRESSINGS) ×3 IMPLANT
TAPE UMBILICAL COTTON 1/8X30 (MISCELLANEOUS) IMPLANT
TOWEL OR 17X26 10 PK STRL BLUE (TOWEL DISPOSABLE) ×6 IMPLANT
TOWEL OR NON WOVEN STRL DISP B (DISPOSABLE) ×6 IMPLANT
TRAY FOLEY CATH 14FRSI W/METER (CATHETERS) ×3 IMPLANT
TRAY LAP CHOLE (CUSTOM PROCEDURE TRAY) ×3 IMPLANT
TROCAR BLADELESS OPT 5 100 (ENDOMECHANICALS) ×3 IMPLANT
TROCAR XCEL NON-BLD 11X100MML (ENDOMECHANICALS) ×3 IMPLANT
TUBING CONNECTING 10 (TUBING) IMPLANT
TUBING CONNECTING 10' (TUBING)
TUBING FILTER THERMOFLATOR (ELECTROSURGICAL) ×3 IMPLANT
TUNNELER SHEATH ON-Q 16GX12 DP (PAIN MANAGEMENT) IMPLANT
YANKAUER SUCT BULB TIP 10FT TU (MISCELLANEOUS) ×6 IMPLANT

## 2014-08-04 NOTE — Transfer of Care (Signed)
Immediate Anesthesia Transfer of Care Note  Patient: Victoria [name]  Procedure(s) Performed: Procedure(s): EXPLORATORY LAPAROSCOPY, LAPAROSCOPIC PLACEMENT OF GASTROSTOMY TUBE, LAPAROSCOPIC LYSIS OF ADHESIONS (N/A)  Patient Location: PACU  Anesthesia Type:General  Level of Consciousness: sedated  Airway & Oxygen Therapy: Patient Spontanous Breathing and Patient connected to face mask oxygen  Post-op Assessment: Report given to PACU RN and Post -op Vital signs reviewed and stable  Post vital signs: Reviewed and stable  Complications: No apparent anesthesia complications

## 2014-08-04 NOTE — Progress Notes (Signed)
Subjective: No flatus or BM.  No significant abdominal pain.   Objective: Vital signs in last 24 hours: Temp:  [97.9 F (36.6 C)-98.7 F (37.1 C)] 98.7 F (37.1 C) (09/05 0600) Pulse Rate:  [104-450] 450 (09/05 0600) Resp:  [16] 16 (09/05 0600) BP: (125-133)/(79-89) 133/79 mmHg (09/05 0600) SpO2:  [97 %] 97 % (09/05 0600) Weight:  [161 lb 2.5 oz (73.1 kg)] 161 lb 2.5 oz (73.1 kg) (09/05 0600) Last BM Date: 07/29/14  Intake/Output from previous day: 09/04 0701 - 09/05 0700 In: 1587.3 [I.V.:968.3; IV Piggyback:350; TPN:269] Out: 3150 [Urine:1950; Emesis/NG output:1200-bilious Intake/Output this shift:    PE: General- In NAD Abdomen-soft, mild tenderness, hypoactive bowel sounds.  Lab Results:   Recent Labs  08/02/14 1005 08/03/14 0436  WBC 8.5 9.4  HGB 10.0* 9.6*  HCT 29.5* 28.5*  PLT 344 363   BMET  Recent Labs  08/03/14 0436 08/04/14 0445  NA 138 138  K 3.1* 3.3*  CL 90* 90*  CO2 32 34*  GLUCOSE 145* 150*  BUN 19 24*  CREATININE 1.53* 1.50*  CALCIUM 9.1 9.5   PT/INR No results found for this basename: LABPROT, INR,  in the last 72 hours Comprehensive Metabolic Panel:    Component Value Date/Time   NA 138 08/04/2014 0445   NA 138 08/03/2014 0436   NA 138 07/17/2014 0833   NA 137 04/27/2014 1005   K 3.3* 08/04/2014 0445   K 3.1* 08/03/2014 0436   K 4.4 07/17/2014 0833   K 4.6 04/27/2014 1005   CL 90* 08/04/2014 0445   CL 90* 08/03/2014 0436   CO2 34* 08/04/2014 0445   CO2 32 08/03/2014 0436   CO2 24 07/17/2014 0833   CO2 19* 04/27/2014 1005   BUN 24* 08/04/2014 0445   BUN 19 08/03/2014 0436   BUN 7.9 07/17/2014 0833   BUN 21.4 04/27/2014 1005   CREATININE 1.50* 08/04/2014 0445   CREATININE 1.53* 08/03/2014 0436   CREATININE 1.2* 07/17/2014 0833   CREATININE 1.3* 04/27/2014 1005   CREATININE 0.89 12/20/2013 1210   GLUCOSE 150* 08/04/2014 0445   GLUCOSE 145* 08/03/2014 0436   GLUCOSE 112 07/17/2014 0833   GLUCOSE 89 04/27/2014 1005   CALCIUM 9.5 08/04/2014 0445   CALCIUM  9.1 08/03/2014 0436   CALCIUM 9.7 07/17/2014 0833   CALCIUM 9.8 04/27/2014 1005   AST 34 08/03/2014 0436   AST 19 07/31/2014 0442   AST 16 04/27/2014 1005   AST 17 04/05/2014 1322   ALT 47* 08/03/2014 0436   ALT 15 07/31/2014 0442   ALT 11 04/27/2014 1005   ALT 9 04/05/2014 1322   ALKPHOS 87 08/03/2014 0436   ALKPHOS 76 07/31/2014 0442   ALKPHOS 99 04/27/2014 1005   ALKPHOS 98 04/05/2014 1322   BILITOT 0.4 08/03/2014 0436   BILITOT 0.4 07/31/2014 0442   BILITOT 0.31 04/27/2014 1005   BILITOT 0.27 04/05/2014 1322   PROT 6.9 08/03/2014 0436   PROT 6.9 07/31/2014 0442   PROT 8.1 04/27/2014 1005   PROT 7.7 04/05/2014 1322   ALBUMIN 2.5* 08/03/2014 0436   ALBUMIN 2.4* 08/01/2014 0435   ALBUMIN 3.9 04/27/2014 1005   ALBUMIN 3.5 04/05/2014 1322     Studies/Results: No results found.  Anti-infectives: Anti-infectives   Start     Dose/Rate Route Frequency Ordered Stop   07/31/14 1200  piperacillin-tazobactam (ZOSYN) IVPB 3.375 g     3.375 g 12.5 mL/hr over 240 Minutes Intravenous Every 8 hours 07/31/14 1149  07/27/14 2200  piperacillin-tazobactam (ZOSYN) IVPB 2.25 g  Status:  Discontinued     2.25 g 100 mL/hr over 30 Minutes Intravenous 3 times per day 07/27/14 2040 07/31/14 1148      Assessment Principal Problem:   SBO-no clinical improvement. Active Problems:   Rectal cancer uT3uN1 s/p lap LAR/loop ileostomy in the past.   Protein-calorie malnutrition, severe-on TPN       LOS: 8 days   Plan: Check abdominal x-rays.  Given no clinical improvement, I told her we would recommend surgery pending the x-ray results.   Ayaan Shutes J 08/04/2014

## 2014-08-04 NOTE — Progress Notes (Deleted)
PARENTERAL NUTRITION CONSULT NOTE - Follow-up  Pharmacy Consult for New TPN Indication: Prolonged ileus  Allergies  Allergen Reactions  . Ointment Base [Lanolin-Petrolatum] Rash    Specifically burn ointments.    Patient Measurements: Height: 5\' 5"  (165.1 cm) Weight: 161 lb 2.5 oz (73.1 kg) IBW/kg (Calculated) : 57 Adjusted Body Weight: 62 kg Usual Weight: 73 kg  Vital Signs: Temp: 98.7 F (37.1 C) (09/05 0600) Temp src: Oral (09/05 0600) BP: 133/79 mmHg (09/05 0600) Pulse Rate: 450 (09/05 0600) Intake/Output from previous day: 09/04 0701 - 09/05 0700 In: 1587.3 [I.V.:968.3; IV Piggyback:350; TPN:269] Out: 3150 [Urine:1950; Emesis/NG output:1200] Intake/Output from this shift:    Labs:  Recent Labs  08/02/14 1005 08/03/14 0436  WBC 8.5 9.4  HGB 10.0* 9.6*  HCT 29.5* 28.5*  PLT 344 363     Recent Labs  08/02/14 1005 08/03/14 0436 08/04/14 0445  NA 139 138 138  K 3.4* 3.1* 3.3*  CL 93* 90* 90*  CO2 31 32 34*  GLUCOSE 132* 145* 150*  BUN 14 19 24*  CREATININE 1.28* 1.53* 1.50*  CALCIUM 9.3 9.1 9.5  MG  --  1.6 2.3  PHOS  --  4.5  --   PROT  --  6.9  --   ALBUMIN  --  2.5*  --   AST  --  34  --   ALT  --  47*  --   ALKPHOS  --  87  --   BILITOT  --  0.4  --   PREALBUMIN  --  12.9*  --   TRIG  --  170*  --    Estimated Creatinine Clearance: 38.9 ml/min (by C-G formula based on Cr of 1.5).    Recent Labs  08/03/14 2144 08/04/14 0021 08/04/14 0614  GLUCAP 131* 156* 164*    Medical History: Past Medical History  Diagnosis Date  . Medical history non-contributory   . HDL deficiency   . Allergy     seasonal allergic rhinitis  . History of radiation therapy 10/16/13-11/27/13    rectal 50.4Gy  . Peripheral vascular disease 4/15    DVT LEFT LEG  . Cancer 09/19/13    rectum    Medications:  Infusions:  . 0.9 % NaCl with KCl 40 mEq / L 25 mL/hr (08/03/14 2134)  . Marland KitchenTPN (CLINIMIX-E) Adult 50 mL/hr at 08/03/14 1727   And  . fat emulsion  250 mL (08/03/14 1727)    Insulin Requirements:  8 units overnight; no Hx DM  Current Nutrition:NPO  IVF: NS w/ 20 mEq K @ 25 ml/hr  Central access: PICC TPN start date: 9/3  ASSESSMENT                                                                                                          HPI: 63 year old female with history of rectal adenocarcinoma, status post resection and diverting ileostomy in 12/2013 with reversal on 06/07/2014, Recent hospitalization for small bowel obstruction 07/11/14 through 07/14/2014 who presented from cancer center with N/V, FTT.  Her  hospital course was complicated with hyponatremia and acute renal failure, though both appear to be back to baseline at this time.  Ileus/partial SBO has prevented meaningful nutrition over the previous 2-3 weeks and here as well, and pharmacy was consulted to begin TPN.  Significant events:  9/3: noted to have some flatus.  Electrolyte, renal disturbances resolved 9/4: K low despite supplementation yesterday; likely due to hypoMg; phos corrected. Clinimix 5/20 increased to 50mg /hr.   Today 9/5:   Glucose: better controlled </= 150  Electrolytes - K low.  Hypochloremia likely due to significant G-tube suction. Corr Ca = 10.7   Renal - Was at baseline but now trending back up; CrCl 44 ml/min  LFTs - WNL (9/4)  TGs - 170 (9/4)  Prealbumin - 12.9 (9/4)  T. Bili wnl (9/4)  NUTRITIONAL GOALS                                                                                             RD recs:  Kcal: 1700-1800  Protein: 80-90 gm  Fluid: 1.7L daily  Pharmacy to start Clinimix 5/20 at 35 ml/hr and titrate to goal rate of 65 ml/hr with 20% lipids at 10 ml/hr; goal rate would provide 1852 kcal (100% est kcal needs), 78 gram protein (98% est protein needs)  PLAN                                                                                                                         At 1800 today:  Increase Clinimix 5/20 to 70  ml/hr; plan to advance as tolerated to the goal rate.  20% fat emulsion at 10 ml/hr.  TNA to contain standard multivitamins and trace elements.  Continue IVF to 10 ml/hr.  Give IV Potassium 17mEq x 4 today for K of 3.3  Continue SSI w/ q6hr checks moderate resistance scale  TNA lab panels on Mondays & Thursdays.  F/u daily.  Kizzie Furnish, PharmD Pager: [phone] 08/04/2014 9:50 AM

## 2014-08-04 NOTE — Progress Notes (Signed)
[facility]  [address] [place], [place] [address] Phone: [phone] FAX: [phone]    Victoria [name] [id number] 02-Nov-1951  CARE TEAM:  PCP: Irven Shelling, MD  Outpatient Care Team: Patient Care Team: Irven Shelling, MD as PCP - General (Internal Medicine) Carola Frost, RN as Registered Nurse Ladell Pier, MD as Consulting Physician (Oncology)  Inpatient Treatment Team: Treatment Team: Attending Provider: Robbie Lis, MD; Attending Physician: Robbie Lis, MD; Consulting Physician: Ladell Pier, MD; Rounding Team: Joycelyn Das, MD; Registered Nurse: Kayleen Memos, RN; Consulting Physician: Leighton Ruff, MD; Registered Nurse: Rise Patience, RN; Registered Nurse: Boneta Lucks, RN; Technician: Leda Quail, NT; Registered Nurse: Claris Pong, RN; Registered Nurse: Earline Mayotte II, RN   Subjective:  Still rather uncomfortable.  Heart rate up.  Husband and RN in room  Objective:  Vital signs:  Filed Vitals:   08/03/14 1500 08/03/14 2155 08/04/14 0600 08/04/14 1412  BP: 125/89 133/86 133/79 140/85  Pulse: 119 104 450 108  Temp:  97.9 F (36.6 C) 98.7 F (37.1 C) 97.8 F (36.6 C)  TempSrc:  Oral Oral Oral  Resp:  _0 Height:      Weight:   161 lb 2.5 oz (73.1 kg)   SpO2:  97% 97% 93%    Last BM Date: 07/29/14  Intake/Output   Yesterday:  09/04 0701 - 09/05 0700 In: 1587.3 [I.V.:968.3; IV Piggyback:350; TPN:269] Out: 3150 [VZSMO:7078; Emesis/NG output:1200] This shift:  Total I/O In: 30 [Other:30] Out: 400 [Urine:400]  Bowel function:  Flatus: n  BM: n  Drain: large bilious volume  Physical Exam:  General: Pt awake/alert/oriented x4 in mild distress Eyes: PERRL, normal EOM.  Sclera clear.  No icterus Neuro: CN II-XII intact w/o focal sensory/motor deficits. Lymph: No head/neck/groin lymphadenopathy Psych:  No  delerium/psychosis/paranoia.  Tired & sickly. HENT: Normocephalic, Mucus membranes moist.  No thrush Neck: Supple, No tracheal deviation Chest: No chest wall pain w good excursion CV:  Pulses intact.  Regular rhythm MS: Normal AROM mjr joints.  No obvious deformity Abdomen: Soft.  Mod distended.  Mod tender lower abdomen.  No evidence of peritonitis.  No incarcerated hernias. Ext:  SCDs BLE.  No mjr edema.  No cyanosis Skin: No petechiae / purpura   Problem List:   Principal Problem:   SBO (small bowel obstruction) Active Problems:   Rectal cancer uT3uN1 s/p lap LAR/loop ileostomy   DVT (deep venous thrombosis)   Adult failure to thrive   Acute renal failure   Protein-calorie malnutrition, severe   Hyponatremia   Cholecystitis, acute   Anemia of chronic disease   Assessment  Victoria [name]  63 y.o. female       Risks and small bowel obstruction by history and films.  Increasing heart rate and abdominal pain concerning  Plan:  I think she is reached past the point of Recovering from her bowel obstruction nonoperatively.  Think she would benefit from laparoscopic possible open expiration.  Also give option of evaluating her gallbladder and liver.  She and her has been agreed.  We will try to do soon:  The anatomy & physiology of the digestive tract was discussed.  The pathophysiology of intestinal obstruction was discussed.  Natural history risks without surgery was discussed.   I feel the patient has failed non-operative therapies.  The risks of no intervention will lead to serious problems such as necrosis, perforation,  dehydration, etc. that outweigh the operative risks; therefore, I recommended abdominal exploration to diagnose & treat the source of the problem.  Laparoscopic & open techniques were discussed.   I expressed a good likelihood that surgery will treat the problem.  Risks such as bleeding, infection, abscess, leak, reoperation, bowel resection, possible  ostomy, hernia, heart attack, death, and other risks were discussed.   I noted a good likelihood this will help address the problem.  Goals of post-operative recovery were discussed as well.  We will work to minimize complications. Questions were answered.  The patient expresses understanding & wishes to proceed with surgery.   -VTE prophylaxis- SCDs, etc -mobilize as tolerated to help recovery  Adin Hector, M.D., F.A.C.S. Gastrointestinal and Minimally Invasive Surgery [facility] [address] [place], [place] [address] [phone] Main / Paging   08/04/2014   Results:   Labs: Results for orders placed during the hospital encounter of 07/27/14 (from the past 48 hour(s))  GLUCOSE, CAPILLARY     Status: Abnormal   Collection Time    08/02/14  4:47 PM      Result Value Ref Range   Glucose-Capillary 130 (*) 70 - 99 mg/dL  GLUCOSE, CAPILLARY     Status: Abnormal   Collection Time    08/02/14 10:47 PM      Result Value Ref Range   Glucose-Capillary 150 (*) 70 - 99 mg/dL  GLUCOSE, CAPILLARY     Status: Abnormal   Collection Time    08/03/14 12:28 AM      Result Value Ref Range   Glucose-Capillary 163 (*) 70 - 99 mg/dL  CBC     Status: Abnormal   Collection Time    08/03/14  4:36 AM      Result Value Ref Range   WBC 9.4  4.0 - 10.5 K/uL   RBC 3.60 (*) 3.87 - 5.11 MIL/uL   Hemoglobin 9.6 (*) 12.0 - 15.0 g/dL   HCT 28.5 (*) 36.0 - 46.0 %   MCV 79.2  78.0 - 100.0 fL   MCH 26.7  26.0 - 34.0 pg   MCHC 33.7  30.0 - 36.0 g/dL   RDW 15.7 (*) 11.5 - 15.5 %   Platelets 363  150 - 400 K/uL  COMPREHENSIVE METABOLIC PANEL     Status: Abnormal   Collection Time    08/03/14  4:36 AM      Result Value Ref Range   Sodium 138  137 - 147 mEq/L   Potassium 3.1 (*) 3.7 - 5.3 mEq/L   Chloride 90 (*) 96 - 112 mEq/L   CO2 32  19 - 32 mEq/L   Glucose, Bld 145 (*) 70 - 99 mg/dL   BUN 19  6 - 23 mg/dL   Creatinine, Ser 1.53 (*) 0.50 - 1.10 mg/dL    Calcium 9.1  8.4 - 10.5 mg/dL   Total Protein 6.9  6.0 - 8.3 g/dL   Albumin 2.5 (*) 3.5 - 5.2 g/dL   AST 34  0 - 37 U/L   ALT 47 (*) 0 - 35 U/L   Alkaline Phosphatase 87  39 - 117 U/L   Total Bilirubin 0.4  0.3 - 1.2 mg/dL   GFR calc non Af Amer 35 (*) >90 mL/min   GFR calc Af Amer 41 (*) >90 mL/min   Comment: (NOTE)     The eGFR has been calculated using the CKD EPI equation.     This calculation  has not been validated in all clinical situations.     eGFR's persistently <90 mL/min signify possible Chronic Kidney     Disease.   Anion gap 16 (*) 5 - 15  PREALBUMIN     Status: Abnormal   Collection Time    08/03/14  4:36 AM      Result Value Ref Range   Prealbumin 12.9 (*) 17.0 - 34.0 mg/dL   Comment: Performed at [facility]     Status: None   Collection Time    08/03/14  4:36 AM      Result Value Ref Range   Magnesium 1.6  1.5 - 2.5 mg/dL  PHOSPHORUS     Status: None   Collection Time    08/03/14  4:36 AM      Result Value Ref Range   Phosphorus 4.5  2.3 - 4.6 mg/dL  TRIGLYCERIDES     Status: Abnormal   Collection Time    08/03/14  4:36 AM      Result Value Ref Range   Triglycerides 170 (*) <150 mg/dL   Comment: Performed at [facility]  DIFFERENTIAL     Status: Abnormal   Collection Time    08/03/14  4:36 AM      Result Value Ref Range   Neutrophils Relative % 80 (*) 43 - 77 %   Lymphocytes Relative 9 (*) 12 - 46 %   Monocytes Relative 11  3 - 12 %   Eosinophils Relative 0  0 - 5 %   Basophils Relative 0  0 - 1 %   Neutro Abs 7.6  1.7 - 7.7 K/uL   Lymphs Abs 0.8  0.7 - 4.0 K/uL   Monocytes Absolute 1.0  0.1 - 1.0 K/uL   Eosinophils Absolute 0.0  0.0 - 0.7 K/uL   Basophils Absolute 0.0  0.0 - 0.1 K/uL   RBC Morphology TARGET CELLS     WBC Morphology MILD LEFT SHIFT (1-5% METAS, OCC MYELO, OCC BANDS)    GLUCOSE, CAPILLARY     Status: Abnormal   Collection Time    08/03/14  6:17 AM      Result Value Ref Range   Glucose-Capillary 150  (*) 70 - 99 mg/dL  GLUCOSE, CAPILLARY     Status: Abnormal   Collection Time    08/03/14  9:36 AM      Result Value Ref Range   Glucose-Capillary 142 (*) 70 - 99 mg/dL  GLUCOSE, CAPILLARY     Status: Abnormal   Collection Time    08/03/14  1:19 PM      Result Value Ref Range   Glucose-Capillary 178 (*) 70 - 99 mg/dL  GLUCOSE, CAPILLARY     Status: Abnormal   Collection Time    08/03/14  5:30 PM      Result Value Ref Range   Glucose-Capillary 145 (*) 70 - 99 mg/dL  GLUCOSE, CAPILLARY     Status: Abnormal   Collection Time    08/03/14  9:44 PM      Result Value Ref Range   Glucose-Capillary 131 (*) 70 - 99 mg/dL  GLUCOSE, CAPILLARY     Status: Abnormal   Collection Time    08/04/14 12:21 AM      Result Value Ref Range   Glucose-Capillary 156 (*) 70 - 99 mg/dL  MAGNESIUM     Status: None   Collection Time    08/04/14  4:45 AM  Result Value Ref Range   Magnesium 2.3  1.5 - 2.5 mg/dL  BASIC METABOLIC PANEL     Status: Abnormal   Collection Time    08/04/14  4:45 AM      Result Value Ref Range   Sodium 138  137 - 147 mEq/L   Potassium 3.3 (*) 3.7 - 5.3 mEq/L   Chloride 90 (*) 96 - 112 mEq/L   CO2 34 (*) 19 - 32 mEq/L   Glucose, Bld 150 (*) 70 - 99 mg/dL   BUN 24 (*) 6 - 23 mg/dL   Creatinine, Ser 1.50 (*) 0.50 - 1.10 mg/dL   Calcium 9.5  8.4 - 10.5 mg/dL   GFR calc non Af Amer 36 (*) >90 mL/min   GFR calc Af Amer 42 (*) >90 mL/min   Comment: (NOTE)     The eGFR has been calculated using the CKD EPI equation.     This calculation has not been validated in all clinical situations.     eGFR's persistently <90 mL/min signify possible Chronic Kidney     Disease.   Anion gap 14  5 - 15  GLUCOSE, CAPILLARY     Status: Abnormal   Collection Time    08/04/14  6:14 AM      Result Value Ref Range   Glucose-Capillary 164 (*) 70 - 99 mg/dL  MRSA PCR SCREENING     Status: None   Collection Time    08/04/14 10:30 AM      Result Value Ref Range   MRSA by PCR NEGATIVE   NEGATIVE   Comment:            The GeneXpert MRSA Assay (FDA     approved for NASAL specimens     only), is one component of a     comprehensive MRSA colonization     surveillance program. It is not     intended to diagnose MRSA     infection nor to guide or     monitor treatment for     MRSA infections.  GLUCOSE, CAPILLARY     Status: Abnormal   Collection Time    08/04/14 12:02 PM      Result Value Ref Range   Glucose-Capillary 152 (*) 70 - 99 mg/dL    Imaging / Studies: Dg Abd 2 Views  08/04/2014   CLINICAL DATA:  History of rectal adenocarcinoma status post radiation and chemotherapy, diverting ileostomy status post reversal, no bowel sounds, evaluate for SBO  EXAM: ABDOMEN - 2 VIEW  COMPARISON:  08/02/2014  FINDINGS: Enteric tube terminates in the distal gastric antrum/ proximal duodenum.  Mildly prominent loops of small bowel in the left mid abdomen with air-fluid levels, suggesting partial small bowel obstruction or small bowel enteritis.  No evidence of free air under the diaphragm on the upright view.  Residual contrast in the right colon/distal small bowel in the right mid abdomen.  Surgical hardware related to prior ORIF of the left proximal femur  IMPRESSION: Enteric tube terminates in the distal gastric antrum/proximal duodenum.  Dilated loops of small bowel with air-fluid levels in the left mid abdomen, suggesting partial small bowel obstruction or small bowel enteritis.  No free air.   Electronically Signed   By: Julian Hy M.D.   On: 08/04/2014 10:56    Medications / Allergies: per chart  Antibiotics: Anti-infectives   Start     Dose/Rate Route Frequency Ordered Stop   07/31/14 1200  piperacillin-tazobactam (ZOSYN) IVPB 3.375  g     3.375 g 12.5 mL/hr over 240 Minutes Intravenous Every 8 hours 07/31/14 1149     07/27/14 2200  piperacillin-tazobactam (ZOSYN) IVPB 2.25 g  Status:  Discontinued     2.25 g 100 mL/hr over 30 Minutes Intravenous 3 times per day  07/27/14 2040 07/31/14 1148       Note: Portions of this report may have been transcribed using voice recognition software. Every effort was made to ensure accuracy; however, inadvertent computerized transcription errors may be present.   Any transcriptional errors that result from this process are unintentional.

## 2014-08-04 NOTE — Progress Notes (Signed)
PARENTERAL NUTRITION CONSULT NOTE - Follow-up  Pharmacy Consult for New TPN Indication: Prolonged ileus  Allergies  Allergen Reactions  . Ointment Base [Lanolin-Petrolatum] Rash    Specifically burn ointments.    Patient Measurements: Height: 5\' 5"  (165.1 cm) Weight: 161 lb 2.5 oz (73.1 kg) IBW/kg (Calculated) : 57 Adjusted Body Weight: 62 kg Usual Weight: 73 kg  Vital Signs: Temp: 98.7 F (37.1 C) (09/05 0600) Temp src: Oral (09/05 0600) BP: 133/79 mmHg (09/05 0600) Pulse Rate: 450 (09/05 0600) Intake/Output from previous day: 09/04 0701 - 09/05 0700 In: 1587.3 [I.V.:968.3; IV Piggyback:350; TPN:269] Out: 3150 [Urine:1950; Emesis/NG output:1200] Intake/Output from this shift:    Labs:  Recent Labs  08/02/14 1005 08/03/14 0436  WBC 8.5 9.4  HGB 10.0* 9.6*  HCT 29.5* 28.5*  PLT 344 363     Recent Labs  08/02/14 1005 08/03/14 0436 08/04/14 0445  NA 139 138 138  K 3.4* 3.1* 3.3*  CL 93* 90* 90*  CO2 31 32 34*  GLUCOSE 132* 145* 150*  BUN 14 19 24*  CREATININE 1.28* 1.53* 1.50*  CALCIUM 9.3 9.1 9.5  MG  --  1.6 2.3  PHOS  --  4.5  --   PROT  --  6.9  --   ALBUMIN  --  2.5*  --   AST  --  34  --   ALT  --  47*  --   ALKPHOS  --  87  --   BILITOT  --  0.4  --   PREALBUMIN  --  12.9*  --   TRIG  --  170*  --    Estimated Creatinine Clearance: 38.9 ml/min (by C-G formula based on Cr of 1.5).    Recent Labs  08/03/14 2144 08/04/14 0021 08/04/14 0614  GLUCAP 131* 156* 164*    Medical History: Past Medical History  Diagnosis Date  . Medical history non-contributory   . HDL deficiency   . Allergy     seasonal allergic rhinitis  . History of radiation therapy 10/16/13-11/27/13    rectal 50.4Gy  . Peripheral vascular disease 4/15    DVT LEFT LEG  . Cancer 09/19/13    rectum    Medications:  Infusions:  . 0.9 % NaCl with KCl 40 mEq / L 25 mL/hr (08/03/14 2134)  . Marland KitchenTPN (CLINIMIX-E) Adult 50 mL/hr at 08/03/14 1727   And  . fat emulsion  250 mL (08/03/14 1727)    Insulin Requirements:  8 units overnight; no Hx DM  Current Nutrition:NPO  IVF: NS w/ 20 mEq K @ 25 ml/hr  Central access: PICC TPN start date: 9/3  ASSESSMENT                                                                                                          HPI: 63 year old female with history of rectal adenocarcinoma, status post resection and diverting ileostomy in 12/2013 with reversal on 06/07/2014, Recent hospitalization for small bowel obstruction 07/11/14 through 07/14/2014 who presented from cancer center with N/V, FTT.  Her  hospital course was complicated with hyponatremia and acute renal failure, though both appear to be back to baseline at this time.  Ileus/partial SBO has prevented meaningful nutrition over the previous 2-3 weeks and here as well, and pharmacy was consulted to begin TPN.  Significant events:  9/3: noted to have some flatus.  Electrolyte, renal disturbances resolved 9/4: K low despite supplementation yesterday; likely due to hypoMg; phos corrected. Clinimix 5/20 increased to 50mg /hr.   Today 9/5:   Glucose: better controlled </= 150  Electrolytes - K low.  Hypochloremia likely due to significant G-tube suction. Corr Ca = 10.7   Renal - Was at baseline but now trending back up; CrCl 44 ml/min  LFTs - WNL (9/4)  TGs - 170 (9/4)  Prealbumin - 12.9 (9/4)  T. Bili wnl (9/4)  NUTRITIONAL GOALS                                                                                             RD recs:  Kcal: 1700-1800  Protein: 80-90 gm  Fluid: 1.7L daily  Pharmacy to start Clinimix 5/20 at 35 ml/hr and titrate to goal rate of 65 ml/hr with 20% lipids at 10 ml/hr; goal rate would provide 1852 kcal (100% est kcal needs), 78 gram protein (98% est protein needs)  PLAN                                                                                                                         At 1800 today:  Increase Clinimix 5/20 to  goal of 65 ml/hr  20% fat emulsion at 10 ml/hr.  TNA to contain standard multivitamins and trace elements.  Continue IVF to 10 ml/hr.  Give IV Potassium 46mEq x 4 today for K of 3.3  Continue SSI w/ q6hr checks moderate resistance scale  TNA lab panels on Mondays & Thursdays.  F/u daily.  Kizzie Furnish, PharmD Pager: [phone] 08/04/2014 9:52 AM

## 2014-08-04 NOTE — Progress Notes (Signed)
Patient ID: Victoria [name], female   DOB: 1951-11-03, 63 y.o.   MRN: [id number] [facility] PROGRESS NOTE  Victoria [name] JOI:[id number] DOB: [birth year] DOA: 07/27/2014 PCP: Irven Shelling, MD  Brief narrative: 63 year old female with history of rectal adenocarcinoma, status post radiation and chemotherapy, status post resection and diverting ileostomy in 12/2013, ileostomy reversal on 06/07/2014, history of DVT on anticoagulation with Lovenox, recent hospitalization for small bowel obstruction 07/11/14 through 07/14/2014 who presented from cancer center with nausea and vomiting, failure to thrive. Abdominal US showed possible acute cholecystitis for which she was started on zosyn. In addition, she was found to have severe hyponatremia of 107 but it has improved with IV normal saline. Her creatinine was 4.62 on admission which was thought to be reflective of recent CT abdomen with contrast she had 07/18/2014.  Her hospital course is complicated with ongoing small bowel obstruction.   Assessment & Plan   Principal Problem:  Abdominal distention / Small bowel obstruction  Initial abd x ray showed gaseous distention and NG tube was placed for decompression. Patient continues to have quite a significant output through NG tube. She will likely require laparoscopy, will follow up with surgery.  Continue supportive care with IV fluids, analgesia and antiemetics as needed.  Appreciate surgery following.  Continue Colace, MiraLAX, simethicone for bowel regimen. Active Problems:  Acute cholecystitis  Acute cholecystitis noted on abdominal US. Pt started on empiric zosyn. No significant pain on physical exam.  Continue Zosyn for now. HIDA scan on hold for now. Patient remains afebrile and white blood cell count is within normal limits.  Rectal adenocarcinoma with liver metastases  Recent CT abdomen done 07/18/2014 with findings of new small right pleural effusion; stable 5 mm posterior  right upper lobe lung nodule; gallstones noted within the gallbladder; marked gallbladder wall thickening measuring up to 11 mm in thickness at the fundus; abnormal low density noted in the adjacent inferior liver with small scattered satellite nodules in the adjacent liver. Findings within the liver new since the prior study. Small amount of perihepatic fluid adjacent to the right hepatic lobe and gallbladder. Extensive edema/stranding within the pelvis extending from the presacral space anteriorly. New area of sclerosis right pubic bone.  Appreciate oncology following Acute renal failure / Contrast induced nephropathy  Possibly contrast induced nephropathy  Creatinine improved with IV fluids. Most recent creatinine 1.53.  Patient was seen by nephrology in consultation. Hypochloremic hyponatremia / metabolic alkalosis / hypokalemia  Likely dehydration, GI losses  Sodium improved with normal saline, 470 --> 962  Metabolic alkalosis resolved.  Potassium repleted through IV fluids. Anemia of chronic disease  Likely due to history of malignancy.  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  Severe protein calorie malnutrition  Nutrition consulted.Has NG tube for abdominal distention.  Started TNA for nutritional support.  DVT prophylaxis  On therapeutic anticoagulation with Lovenox which is on hold in anticipation of surgery today.   Code Status: Full.  Family Communication: plan of care discussed with the patient and her husband at the bedside  Disposition Plan: home when stable   IV Access:   Peripheral IV Procedures and diagnostic studies:   US Abdomen Complete 07/28/2014 1. Gallstones with 1 stone measuring 1.7 cm lodged within the gallbladder neck. Wall is thickened to 5.2 mm. Multiple echogenic foci project along the gallbladder wall. This could reflect a air. Findings support acute cholecystitis in the proper  clinical setting. 2. Hypoechoic liver lesions which are nonspecific. They do not appear to be cysts. Largest lies in the right lobe measuring 15 mm. Liver shows a diffusely coarsened increased echotexture consistent with hepatic steatosis. 3. No bile duct dilation.  Abdominal X ray 07/29/2014 - abdominal distention likely due to gaseous distention in stomach. Consider NG tube for decompression.  Dg Abd Portable 1v 07/31/2014 Interval decompression of the stomach with NG tube. Persistent gaseous distention of multiple loops of small bowel.  TNA 08/02/2014  Dg Abd 2 Views 08/04/2014  Enteric tube terminates in the distal gastric antrum/proximal duodenum.  Dilated loops of small bowel with air-fluid levels in the left mid abdomen, suggesting partial small bowel obstruction or small bowel enteritis.  No free air.    Medical Consultants:   Oncology (Dr. Betsy Coder)  Nephrology (Dr. Roney Jaffe)  Surgery (Dr. Leighton Ruff)   Other Consultants:   Nutrition   Anti-Infectives:   Zosyn 07/27/2014 -->   Leisa Lenz, MD  [facility] Pager [phone]  If 7PM-7AM, please contact night-coverage [email or web address] Password [facility] 08/04/2014, 12:29 PM   LOS: 8 days    HPI/Subjective: No acute overnight events.  Objective: Filed Vitals:   08/03/14 0600 08/03/14 1500 08/03/14 2155 08/04/14 0600  BP: 131/87 125/89 133/86 133/79  Pulse: 107 119 104 450  Temp: 98.2 F (36.8 C)  97.9 F (36.6 C) 98.7 F (37.1 C)  TempSrc: Oral  Oral Oral  Resp: 16  16 16   Height:      Weight:    73.1 kg (161 lb 2.5 oz)  SpO2: 96%  97% 97%    Intake/Output Summary (Last 24 hours) at 08/04/14 1229 Last data filed at 08/04/14 0600  Gross per 24 hour  Intake 1337.34 ml  Output   2800 ml  Net -1462.66 ml    Exam:   General:  Pt is alert, sitting in chair, no distress  Cardiovascular: Regular rate and rhythm, S1/S2, no murmurs  Respiratory: bilateral air entry, no crackles   Abdomen: NG tube in  place, abdomen distended, positive bowel sounds   Extremities: No edema, pulses DP and PT palpable bilaterally  Neuro: Grossly nonfocal  Data Reviewed: Basic Metabolic Panel:  Recent Labs Lab 07/30/14 1420 07/31/14 0442 08/01/14 0435 08/02/14 1005 08/03/14 0436 08/04/14 0445  NA 128* 132* 138 139 138 138  K 2.5* 3.8 3.6* 3.4* 3.1* 3.3*  CL 75* 84* 92* 93* 90* 90*  CO2 29 31 27 31  32 34*  GLUCOSE 163* 80 94 132* 145* 150*  BUN 30* 26* 19 14 19  24*  CREATININE 1.90* 1.71* 1.22* 1.28* 1.53* 1.50*  CALCIUM 7.4* 8.7 9.1 9.3 9.1 9.5  MG  --   --   --   --  1.6 2.3  PHOS 2.1*  --  1.7*  --  4.5  --    Liver Function Tests:  Recent Labs Lab 07/30/14 1420 07/31/14 0442 08/01/14 0435 08/03/14 0436  AST  --  19  --  34  ALT  --  15  --  47*  ALKPHOS  --  76  --  87  BILITOT  --  0.4  --  0.4  PROT  --  6.9  --  6.9  ALBUMIN 2.1* 2.5* 2.4* 2.5*   No results found for this basename: LIPASE, AMYLASE,  in the last 168 hours No results found for this basename: AMMONIA,  in the last 168 hours CBC:  Recent Labs Lab 07/31/14 0442 08/01/14 0435  08/02/14 1005 08/03/14 0436  WBC 8.5 9.1 8.5 9.4  NEUTROABS  --   --   --  7.6  HGB 9.9* 9.8* 10.0* 9.6*  HCT 30.8* 29.3* 29.5* 28.5*  MCV 80.6 79.0 77.6* 79.2  PLT 376 413* 344 363   Cardiac Enzymes: No results found for this basename: CKTOTAL, CKMB, CKMBINDEX, TROPONINI,  in the last 168 hours BNP: No components found with this basename: POCBNP,  CBG:  Recent Labs Lab 08/03/14 1730 08/03/14 2144 08/04/14 0021 08/04/14 0614 08/04/14 1202  GLUCAP 145* 131* 156* 164* 152*    MRSA PCR SCREENING     Status: None   Collection Time    07/29/14  9:33 AM      Result Value Ref Range Status   MRSA by PCR NEGATIVE  NEGATIVE Final  MRSA PCR SCREENING     Status: None   Collection Time    08/04/14 10:30 AM      Result Value Ref Range Status   MRSA by PCR NEGATIVE  NEGATIVE Final     Scheduled Meds: . docusate sodium   100 mg Oral BID  . insulin aspart  0-15 Units Subcutaneous 4 times per day  . piperacillin-tazobactam (ZOSYN)  IV  3.375 g Intravenous Q8H  . polyethylene glycol  17 g Oral BID  . potassium chloride  10 mEq Intravenous Q1 Hr x 4   Continuous Infusions: . 0.9 % NaCl with KCl 40 mEq / L 25 mL/hr (08/03/14 2134)  . 0.9 % NaCl with KCl 40 mEq / L    . Marland KitchenTPN (CLINIMIX-E) Adult 50 mL/hr at 08/03/14 1727   And  . fat emulsion 250 mL (08/03/14 1727)  . Marland KitchenTPN (CLINIMIX-E) Adult     And  . fat emulsion

## 2014-08-04 NOTE — Progress Notes (Signed)
Patient off floor to OR

## 2014-08-04 NOTE — Anesthesia Preprocedure Evaluation (Signed)
Anesthesia Evaluation  Patient identified by MRN, date of birth, ID band Patient awake    Reviewed: Allergy & Precautions, H&P , NPO status , Patient's Chart, lab work & pertinent test results  Airway Mallampati: II TM Distance: >3 FB Neck ROM: Full    Dental no notable dental hx.    Pulmonary neg pulmonary ROS,  breath sounds clear to auscultation  Pulmonary exam normal       Cardiovascular DVT Rhythm:Regular Rate:Tachycardia     Neuro/Psych negative neurological ROS  negative psych ROS   GI/Hepatic negative GI ROS, Neg liver ROS,   Endo/Other  negative endocrine ROS  Renal/GU ARFRenal disease  negative genitourinary   Musculoskeletal negative musculoskeletal ROS (+)   Abdominal   Peds negative pediatric ROS (+)  Hematology  (+) anemia ,   Anesthesia Other Findings   Reproductive/Obstetrics negative OB ROS                           Anesthesia Physical Anesthesia Plan  ASA: III and emergent  Anesthesia Plan: General   Post-op Pain Management:    Induction: Intravenous, Rapid sequence and Cricoid pressure planned  Airway Management Planned: Oral ETT  Additional Equipment:   Intra-op Plan:   Post-operative Plan: Extubation in OR  Informed Consent: I have reviewed the patients History and Physical, chart, labs and discussed the procedure including the risks, benefits and alternatives for the proposed anesthesia with the patient or authorized representative who has indicated his/her understanding and acceptance.   Dental advisory given  Plan Discussed with: CRNA and Surgeon  Anesthesia Plan Comments:         Anesthesia Quick Evaluation

## 2014-08-04 NOTE — Op Note (Addendum)
07/27/2014 - 08/04/2014  6:25 PM  PATIENT:  Victoria [name]  63 y.o. female  Patient Care Team: Irven Shelling, MD as PCP - General (Internal Medicine) Carola Frost, RN as Registered Nurse Ladell Pier, MD as Consulting Physician (Oncology)  PRE-OPERATIVE DIAGNOSIS:  Small bowel obstruction  POST-OPERATIVE DIAGNOSIS:  CARCINOMATOSIS with malignant SBO  PROCEDURE:  Procedure(s): DIAGNOSTIC LAPAROSCOPY LAPAROSCOPIC LYSIS OF ADHESIONS PERITONEAL BIOPSY OF CARCINOMATOSIS LAPAROSCOPIC PLACEMENT OF GASTROSTOMY TUBE  SURGEON:  Surgeon(s): Michael Boston, MD  ASSISTANT: RN   ANESTHESIA:   local and general  EBL:  Total I/O In: 1880 [I.V.:1200; Other:30; NG/GT:600; IV Piggyback:50] Out: 400 [Urine:400]  Delay start of Pharmacological VTE agent (>24hrs) due to surgical blood loss or risk of bleeding:  no  DRAINS: G Tube (24Fr Cook balloon tip)  SPECIMEN:  PERITONEAL MASS (carcinomatosis on falciform ligament)  DISPOSITION OF SPECIMEN:  PATHOLOGY COUNTS:  YES  PLAN OF CARE: Admit to inpatient   PATIENT DISPOSITION:  PACU - guarded condition.  INDICATION:   Pt with persistent SBO.  H/o rectal cancer s/p lap resection & declined chemotherapy.  I offered surgery   The anatomy & physiology of the digestive tract was discussed.  The pathophysiology of intestinal obstruction was discussed.  Natural history risks without surgery was discussed.   I feel the patient has failed non-operative therapies.  The risks of no intervention will lead to serious problems such as necrosis, perforation, dehydration, etc. that outweigh the operative risks; therefore, I recommended abdominal exploration to diagnose & treat the source of the problem.  Laparoscopic & open techniques were discussed.   I expressed a good likelihood that surgery will treat the problem.  Risks such as bleeding, infection, abscess, leak, reoperation, bowel resection, possible ostomy, hernia, heart attack, death, and  other risks were discussed.   I noted a good likelihood this will help address the problem.  Goals of post-operative recovery were discussed as well.  We will work to minimize complications. Questions were answered.  The patient expresses understanding & wishes to proceed with surgery.   OR FINDINGS:  Diffuse carcinomatosis with multiple studding on visceral and parietal peritoneum.  Firm rocky hard areas in left lower quadrant and pelvic rim.  Malignant adhesions of small bowel to right anterior anterior abdominal wall.   Right upper quadrant with carcinomatosis around gallbladder.  Left upper quadrant mostly spared.  No evidence of necrosis or perforation but views limited secondary to fixed abdomen from carcinomatosis  24 [place] BARD feeding gastrostomy tube in the left upper quadrant. (Balloon filled with 68mL water)  DESCRIPTION:   Informed consent was confirmed. The patient underwent general anaesthesia without difficulty. The patient was positioned appropriately. VTE prevention in place. The patient's abdomen was clipped, prepped, & draped in a sterile fashion. Surgical timeout confirmed our plan.   The patient was positioned in reverse Trendelenburg. Abdominal entry with a 14mm laparoscopic port was gained using optical entry technique in the left upper abdomen. Entry was clean. I induced carbon dioxide insufflation. Camera inspection revealed no injury. Extra ports were carefully placed under direct laparoscopic visualization. A left flank port t was upsized to a 10 mm port.   Diagnostic laparoscopy revealed Massive studding of thousands of white plaques on visceral and parietal peritoneum.  The gallbladder was encased with white fibrous mass.  Small bowel adherent in right anterior abdominal wall.  Left lower quadrant completely fixed and frozen.  Pelvic brim covered and dilated but viable small bowel.  Fixed.  I sent tissue off for biopsy to confirm carcinomatosis.  I felt in this  situation morbidity would be extremely high converting to open laparotomy.  Most of small bowel wound have to be resected & anastomosis not be safe with the massive carcinomatosis.  Therefore decided place palliative gastrostomy tube and abort further surgery at this time.  I discussed with my surgical partner, Dr. Donnie Mesa, whom agreed.  I could find the stomach. I could reach the greater curvature to come up into the left upper abdomen. I proceeded to place 2-0 prolene interrupted sutures on the stomach along the mid greater curvature where it easily reached up. I did this x5 in a 4cm circle. I then did a 2-0 PDS pursestring stitch inside that.  I pulled up the outer rim 5 prolene sutures through the anterior abdominal wall near the G-tube site to help tack it in place using a laparoscopic suture passer (Endoclose).   I was able to place a needle with peel-away sheath Through the abdominal wall into the stomach.  I passed a guidewire up in the stomach but could not get it to pass up into the esophagus.  I therefore decided to switch with placement of a balloon-tip gastrostomy tube directly.  I upsized the wound with the 5 mm port through the wound into the stomach.  I then placed a 9Fr plastic guide styleth through the port and pulled the port out.  I placed a gastrostomy tube over the stylet into the stomach and pulled the stylet For a nice modified Seldinger technique. I blew up the balloon and pulled it up quite well. I then tied the pursestring stitch down intracorporeally to help have a snug stomach seal & wrap around the tube. I then brought up the outer prolene stitches & tied them down. This allowed a good apposition of the stomach to the anterior abdominal wall with a good seal all the way around. The gastrostomy tube flowed and aspirated quite easily.   I did laparoscopic inspection. Saw no injury or other problems elsewhere. I did copious irrigation.  I released carbon dioxide and removed  ports. Close the 10 mm left lateral fascia with 0 Vicryl stitch.   The skin wounds were closed using 4 Monocryl stitch.  Steri-Strips at the puncture sites of the prolene fascial stitches holding the gastrostomy holding the stomach up around the gastrostomy tube.   Patient is extubated & in the recovery room. She is breathing well & looks comfortable. We will watch her the step down unit given her tachycardia to make sure she stabilizes.  I discussed operative findings with the patient's husband. Will need to have medicine, oncology, palliative care contributed opinions status & options of therapy.  Patient husband notes that patient wished to be aggressive in trying chemotherapy.  May need to consider transfer to [facility] to see if she is a candidate for intraperitoneal chemotherapy.  I am concerned this may be too far along for that.  We will follow.  Adin Hector, M.D., F.A.C.S. Gastrointestinal and Minimally Invasive Surgery [facility] [address], [place], [place] [address] [phone] Main / Paging

## 2014-08-05 DIAGNOSIS — C801 Malignant (primary) neoplasm, unspecified: Secondary | ICD-10-CM

## 2014-08-05 DIAGNOSIS — E46 Unspecified protein-calorie malnutrition: Secondary | ICD-10-CM

## 2014-08-05 DIAGNOSIS — C786 Secondary malignant neoplasm of retroperitoneum and peritoneum: Principal | ICD-10-CM

## 2014-08-05 LAB — GLUCOSE, CAPILLARY
GLUCOSE-CAPILLARY: 166 mg/dL — AB (ref 70–99)
GLUCOSE-CAPILLARY: 192 mg/dL — AB (ref 70–99)
GLUCOSE-CAPILLARY: 197 mg/dL — AB (ref 70–99)
GLUCOSE-CAPILLARY: 198 mg/dL — AB (ref 70–99)
Glucose-Capillary: 159 mg/dL — ABNORMAL HIGH (ref 70–99)
Glucose-Capillary: 186 mg/dL — ABNORMAL HIGH (ref 70–99)

## 2014-08-05 LAB — BASIC METABOLIC PANEL
Anion gap: 13 (ref 5–15)
BUN: 30 mg/dL — AB (ref 6–23)
CHLORIDE: 98 meq/L (ref 96–112)
CO2: 28 meq/L (ref 19–32)
CREATININE: 1.52 mg/dL — AB (ref 0.50–1.10)
Calcium: 8.8 mg/dL (ref 8.4–10.5)
GFR calc Af Amer: 41 mL/min — ABNORMAL LOW (ref >90)
GFR calc non Af Amer: 36 mL/min — ABNORMAL LOW (ref >90)
Glucose, Bld: 178 mg/dL — ABNORMAL HIGH (ref 70–99)
Potassium: 3.9 mEq/L (ref 3.7–5.3)
Sodium: 139 mEq/L (ref 137–147)

## 2014-08-05 MED ORDER — SODIUM CHLORIDE 0.9 % IV BOLUS (SEPSIS)
1000.0000 mL | Freq: Once | INTRAVENOUS | Status: AC
  Administered 2014-08-05: 1000 mL via INTRAVENOUS

## 2014-08-05 MED ORDER — ENOXAPARIN SODIUM 100 MG/ML ~~LOC~~ SOLN
100.0000 mg | SUBCUTANEOUS | Status: DC
  Administered 2014-08-05 – 2014-08-12 (×7): 100 mg via SUBCUTANEOUS
  Filled 2014-08-05 (×9): qty 1

## 2014-08-05 MED ORDER — FAT EMULSION 20 % IV EMUL
250.0000 mL | INTRAVENOUS | Status: AC
  Administered 2014-08-05: 250 mL via INTRAVENOUS
  Filled 2014-08-05: qty 250

## 2014-08-05 MED ORDER — TRACE MINERALS CR-CU-F-FE-I-MN-MO-SE-ZN IV SOLN
INTRAVENOUS | Status: AC
  Administered 2014-08-05: 17:00:00 via INTRAVENOUS
  Filled 2014-08-05: qty 2000

## 2014-08-05 NOTE — Progress Notes (Signed)
Patient ID: LASHEENA FRIEZE, female   DOB: 1951/10/13, 63 y.o.   MRN: [id number] [facility] PROGRESS NOTE  DESTINI CAMBRE HYI:[id number] DOB: May 24, 1951 DOA: 07/27/2014 PCP: Irven Shelling, MD  Brief narrative: 63 year old female with history of rectal adenocarcinoma, status post radiation and chemotherapy, status post resection and diverting ileostomy in 12/2013, ileostomy reversal on 06/07/2014, history of DVT on anticoagulation with Lovenox, recent hospitalization for small bowel obstruction 07/11/14 through 07/14/2014 who presented from cancer center with nausea and vomiting, failure to thrive. Abdominal US showed possible acute cholecystitis for which she was started on zosyn. In addition, she was found to have severe hyponatremia of 107 but it has improved with IV normal saline. Her creatinine was 4.62 on admission which was thought to be reflective of recent CT abdomen with contrast she had 07/18/2014.  Her hospital course is complicated with ongoing small bowel obstruction. She underwent diagnostic laparoscopy on 08/04/2014 with PEG placement.   Assessment & Plan   Principal Problem:  Abdominal distention / Small bowel obstruction / Peritoneal carcinomatosis   Initial abd x ray showed gaseous distention and NG tube was placed for decompression. Patient continued to have quite a significant output through NG tube. She underwent diagnostic laparoscopy with lysis of adhesions, peritoneal biopsy of carcinomatosis and laparoscopi placement of PEG, 08/04/2014.  Pain management with PCA dilaudid. NPO.  Appreciate surgery following.   Active Problems:  Acute cholecystitis  Acute cholecystitis noted on abdominal US. Pt started on empiric zosyn. Zosyn stopped 08/04/2014. Rectal adenocarcinoma with liver metastases / Peritoneal carcinomatosis Recent CT abdomen done 07/18/2014 with findings of new small right pleural effusion; stable 5 mm posterior right upper lobe lung nodule;  gallstones noted within the gallbladder; marked gallbladder wall thickening measuring up to 11 mm in thickness at the fundus; abnormal low density noted in the adjacent inferior liver with small scattered satellite nodules in the adjacent liver. Findings within the liver new since the prior study. Small amount of perihepatic fluid adjacent to the right hepatic lobe and gallbladder. Extensive edema/stranding within the pelvis extending from the presacral space anteriorly. New area of sclerosis right pubic bone.  Appreciate oncology following Acute renal failure / Contrast induced nephropathy  Possibly contrast induced nephropathy  Creatinine improved with IV fluids. Most recent creatinine 1.52.  Patient was seen by nephrology in consultation. Hypochloremic hyponatremia / metabolic alkalosis / hypokalemia  Likely dehydration, GI losses  Sodium improved with normal saline from 107 to normal  Metabolic alkalosis resolved.  Potassium repleted through IV fluids. Potassium is WNL this am. Anemia of chronic disease  Likely due to history of malignancy.  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  Severe protein calorie malnutrition  Nutrition consulted. Continue TNA for nutritional support.   DVT prophylaxis  On therapeutic anticoagulation with Lovenox.   Code Status: Full.  Family Communication: plan of care discussed with the patient and her husband at the bedside  Disposition Plan: remains in SDU    IV Access / Drains  Peripheral IV PICC line  PEG tube placed 08/04/2014  Procedures and diagnostic studies:   US Abdomen Complete 07/28/2014 1. Gallstones with 1 stone measuring 1.7 cm lodged within the gallbladder neck. Wall is thickened to 5.2 mm. Multiple echogenic foci project along the gallbladder wall. This could reflect a air. Findings support acute cholecystitis in the proper clinical setting. 2. Hypoechoic liver  lesions which are nonspecific. They do not appear  to be cysts. Largest lies in the right lobe measuring 15 mm. Liver shows a diffusely coarsened increased echotexture consistent with hepatic steatosis. 3. No bile duct dilation.  Abdominal X ray 07/29/2014 - abdominal distention likely due to gaseous distention in stomach. Consider NG tube for decompression.  Dg Abd Portable 1v 07/31/2014 Interval decompression of the stomach with NG tube. Persistent gaseous distention of multiple loops of small bowel.  TNA 08/02/2014  Dg Abd 2 Views 08/04/2014 Enteric tube terminates in the distal gastric antrum/proximal duodenum. Dilated loops of small bowel with air-fluid levels in the left mid abdomen, suggesting partial small bowel obstruction or small bowel enteritis. No free air.  DIAGNOSTIC LAPAROSCOPY 08/04/2014; LAPAROSCOPIC LYSIS OF ADHESIONS; PERITONEAL BIOPSY OF CARCINOMATOSIS; LAPAROSCOPIC PLACEMENT OF GASTROSTOMY TUBE    Medical Consultants:   Oncology (Dr. Betsy Coder)  Nephrology (Dr. Roney Jaffe)  Surgery (Dr. Leighton Ruff and Dr. Michael Boston)   Other Consultants:   Nutrition   Anti-Infectives:   Zosyn 07/27/2014 --> 08/04/2014   Leisa Lenz, MD  [facility] Pager [phone]  If 7PM-7AM, please contact night-coverage [email or web address] Password [email or web address] 08/05/2014, 1:13 PM   LOS: 9 days    HPI/Subjective: No acute overnight events.  Objective: Filed Vitals:   08/05/14 0800 08/05/14 0900 08/05/14 1000 08/05/14 1223  BP: 127/80 136/83 145/72   Pulse: 116 113 97   Temp: 98.4 F (36.9 C)     TempSrc: Oral     Resp: 25 36 35 35  Height:      Weight:      SpO2: 98% 97% 100% 98%    Intake/Output Summary (Last 24 hours) at 08/05/14 1313 Last data filed at 08/05/14 1223  Gross per 24 hour  Intake 3317.8 ml  Output    700 ml  Net 2617.8 ml    Exam:   General:  Pt is alert, no distress  Cardiovascular: tachycardic, (+) S1, S2  Respiratory: bilateral air entry, no wheezing    Abdomen: has peg tube, tender in mid abdomen, firm to palpation  Extremities: No edema, pulses DP and PT palpable bilaterally  Neuro: Grossly nonfocal  Data Reviewed: Basic Metabolic Panel:  Recent Labs Lab 07/30/14 1420  08/01/14 0435 08/02/14 1005 08/03/14 0436 08/04/14 0445 08/05/14 0835  NA 128*  < > 138 139 138 138 139  K 2.5*  < > 3.6* 3.4* 3.1* 3.3* 3.9  CL 75*  < > 92* 93* 90* 90* 98  CO2 29  < > 27 31 32 34* 28  GLUCOSE 163*  < > 94 132* 145* 150* 178*  BUN 30*  < > 19 14 19  24* 30*  CREATININE 1.90*  < > 1.22* 1.28* 1.53* 1.50* 1.52*  CALCIUM 7.4*  < > 9.1 9.3 9.1 9.5 8.8  MG  --   --   --   --  1.6 2.3  --   PHOS 2.1*  --  1.7*  --  4.5  --   --   < > = values in this interval not displayed. Liver Function Tests:  Recent Labs Lab 07/30/14 1420 07/31/14 0442 08/01/14 0435 08/03/14 0436  AST  --  19  --  34  ALT  --  15  --  47*  ALKPHOS  --  76  --  87  BILITOT  --  0.4  --  0.4  PROT  --  6.9  --  6.9  ALBUMIN 2.1* 2.5* 2.4* 2.5*   No results found for this basename: LIPASE,  AMYLASE,  in the last 168 hours No results found for this basename: AMMONIA,  in the last 168 hours CBC:  Recent Labs Lab 07/31/14 0442 08/01/14 0435 08/02/14 1005 08/03/14 0436  WBC 8.5 9.1 8.5 9.4  NEUTROABS  --   --   --  7.6  HGB 9.9* 9.8* 10.0* 9.6*  HCT 30.8* 29.3* 29.5* 28.5*  MCV 80.6 79.0 77.6* 79.2  PLT 376 413* 344 363   Cardiac Enzymes: No results found for this basename: CKTOTAL, CKMB, CKMBINDEX, TROPONINI,  in the last 168 hours BNP: No components found with this basename: POCBNP,  CBG:  Recent Labs Lab 08/04/14 1922 08/04/14 2350 08/05/14 0536 08/05/14 0810 08/05/14 1241  GLUCAP 99 159* 198* 197* 186*    MRSA PCR SCREENING     Status: None   Collection Time    07/29/14  9:33 AM      Result Value Ref Range Status   MRSA by PCR NEGATIVE  NEGATIVE Final  MRSA PCR SCREENING     Status: None   Collection Time    08/04/14 10:30 AM       Result Value Ref Range Status   MRSA by PCR NEGATIVE  NEGATIVE Final     Scheduled Meds: . enoxaparin (LOVENOX) injection  100 mg Subcutaneous Q24H  . HYDROmorphone PCA 0.3 mg/mL   Intravenous 6 times per day  . insulin aspart  0-15 Units Subcutaneous 4 times per day   Continuous Infusions: . 0.9 % NaCl with KCl 40 mEq / L 50 mL/hr (08/05/14 0900)  . Marland KitchenTPN (CLINIMIX-E) Adult 65 mL/hr at 08/05/14 0600   And  . fat emulsion 250 mL (08/05/14 0600)  . Marland KitchenTPN (CLINIMIX-E) Adult     And  . fat emulsion

## 2014-08-05 NOTE — Progress Notes (Addendum)
[facility]  [address] [place], [place] [address] Phone: [phone] FAX: [phone]    Victoria [name] [id number] [birth year]  CARE TEAM:  PCP: Irven Shelling, MD  Outpatient Care Team: Patient Care Team: Irven Shelling, MD as PCP - General (Internal Medicine) Carola Frost, RN as Registered Nurse Ladell Pier, MD as Consulting Physician (Oncology)  Inpatient Treatment Team: Treatment Team: Attending Provider: Robbie Lis, MD; Attending Physician: Robbie Lis, MD; Consulting Physician: Ladell Pier, MD; Rounding Team: Joycelyn Das, MD; Registered Nurse: Kayleen Memos, RN; Consulting Physician: Leighton Ruff, MD; Registered Nurse: Rise Patience, RN; Registered Nurse: Boneta Lucks, RN; Technician: Leda Quail, NT; Registered Nurse: Claris Pong, RN; Registered Nurse: Leeroy Cha, RN; Consulting Physician: Nolon Nations, MD; Registered Nurse: Eston Mould, RN; Consulting Physician: Heath Lark, MD   Subjective:  Comfortable.  PCA available Heart rate up. Husband and RN in room  Objective:  Vital signs:  Filed Vitals:   08/05/14 0400 08/05/14 0500 08/05/14 0600 08/05/14 0755  BP: 121/79 126/74 127/79   Pulse: 111 109 107   Temp: 99.2 F (37.3 C)     TempSrc: Oral     Resp: _0 Height:      Weight:      SpO2: 100% 99% 98% 99%    Last BM Date: 07/29/14  Intake/Output   Yesterday:  09/05 0701 - 09/06 0700 In: 3077.8 [I.V.:1800; NG/GT:600; IV Piggyback:50; TPN:597.8] Out: 550 [Urine:550] This shift:     Bowel function:  Flatus: n  BM: n  Drain: G tube flushes well.  Min output for now  Physical Exam:  General: Pt awake/more alert/oriented x4 in mild distress. Tired & sickly.  Calm Eyes: PERRL, normal EOM.  Sclera clear.  No icterus Neuro: CN II-XII intact w/o focal sensory/motor deficits. Lymph: No head/neck/groin  lymphadenopathy Psych:  No delerium/psychosis/paranoia.  HENT: Normocephalic, Mucus membranes moist.  No thrush Neck: Supple, No tracheal deviation Chest: No chest wall pain w good excursion CV:  Pulses intact.  Regular rhythm MS: Normal AROM mjr joints.  No obvious deformity Abdomen: Soft RUQ but still with firm LLQ.  Mod distended.  Min tender lower abdomen.  No evidence of peritonitis.  No incarcerated hernias. Ext:  SCDs BLE.  No mjr edema.  No cyanosis Skin: No petechiae / purpura   Problem List:   Principal Problem:   Carcinomatosis peritonei causing malignant SBO Active Problems:   Rectal cancer - metastatic ([id number]) s/p lap LAR/loop ileostomy 2014   DVT (deep venous thrombosis)   Adult failure to thrive   Acute renal failure   Protein-calorie malnutrition, severe   Hyponatremia   Cholecystitis, acute   Anemia of chronic disease   SBO (small bowel obstruction)   Assessment  Victoria [name]  63 y.o. female  1 Day Post-Op   POST-OPERATIVE DIAGNOSIS: CARCINOMATOSIS with malignant SBO   PROCEDURE: Procedure(s):  DIAGNOSTIC LAPAROSCOPY  LAPAROSCOPIC LYSIS OF ADHESIONS  PERITONEAL BIOPSY OF CARCINOMATOSIS  LAPAROSCOPIC PLACEMENT OF GASTROSTOMY TUBE   SURGEON: Michael Boston, MD    Malignant SBO from carcinomatosis s/p lap BX & palliative G tube  Plan:  -palliative G tube decompession -palliate pain/nausea (not bad today) -low UOP - IVF bolus & inc maintenance.  Prob 3rd spacing after surgery.  Check BMET -VTE Tx - restart full dose enoxaparin & follow closely -mobilize as tolerated to help recovery  I updated the patient's  status to the patient, her husband, and her ICU RN.  Recommendations were made.  Questions were answered.  The patient expressed understanding & appreciation.    D/w Dr Alvy Bimler covering for medical oncology.  She will visit w pt later today/tomorrow to discuss options.  Prognosis poor. Palliative care/Hospice vs palliative  chemoTx   Adin Hector, M.D., F.A.C.S. Gastrointestinal and Minimally Invasive Surgery [facility] [address] [place], [place] [address] [phone] Main / Paging   08/05/2014   Results:   Labs: Results for orders placed during the hospital encounter of 07/27/14 (from the past 48 hour(s))  GLUCOSE, CAPILLARY     Status: Abnormal   Collection Time    08/03/14  9:36 AM      Result Value Ref Range   Glucose-Capillary 142 (*) 70 - 99 mg/dL  GLUCOSE, CAPILLARY     Status: Abnormal   Collection Time    08/03/14  1:19 PM      Result Value Ref Range   Glucose-Capillary 178 (*) 70 - 99 mg/dL  GLUCOSE, CAPILLARY     Status: Abnormal   Collection Time    08/03/14  5:30 PM      Result Value Ref Range   Glucose-Capillary 145 (*) 70 - 99 mg/dL  GLUCOSE, CAPILLARY     Status: Abnormal   Collection Time    08/03/14  9:44 PM      Result Value Ref Range   Glucose-Capillary 131 (*) 70 - 99 mg/dL  GLUCOSE, CAPILLARY     Status: Abnormal   Collection Time    08/04/14 12:21 AM      Result Value Ref Range   Glucose-Capillary 156 (*) 70 - 99 mg/dL  MAGNESIUM     Status: None   Collection Time    08/04/14  4:45 AM      Result Value Ref Range   Magnesium 2.3  1.5 - 2.5 mg/dL  BASIC METABOLIC PANEL     Status: Abnormal   Collection Time    08/04/14  4:45 AM      Result Value Ref Range   Sodium 138  137 - 147 mEq/L   Potassium 3.3 (*) 3.7 - 5.3 mEq/L   Chloride 90 (*) 96 - 112 mEq/L   CO2 34 (*) 19 - 32 mEq/L   Glucose, Bld 150 (*) 70 - 99 mg/dL   BUN 24 (*) 6 - 23 mg/dL   Creatinine, Ser 1.50 (*) 0.50 - 1.10 mg/dL   Calcium 9.5  8.4 - 10.5 mg/dL   GFR calc non Af Amer 36 (*) >90 mL/min   GFR calc Af Amer 42 (*) >90 mL/min   Comment: (NOTE)     The eGFR has been calculated using the CKD EPI equation.     This calculation has not been validated in all clinical situations.     eGFR's persistently <90 mL/min signify possible Chronic Kidney      Disease.   Anion gap 14  5 - 15  GLUCOSE, CAPILLARY     Status: Abnormal   Collection Time    08/04/14  6:14 AM      Result Value Ref Range   Glucose-Capillary 164 (*) 70 - 99 mg/dL  MRSA PCR SCREENING     Status: None   Collection Time    08/04/14 10:30 AM      Result Value Ref Range   MRSA by PCR NEGATIVE  NEGATIVE   Comment:  The GeneXpert MRSA Assay (FDA     approved for NASAL specimens     only), is one component of a     comprehensive MRSA colonization     surveillance program. It is not     intended to diagnose MRSA     infection nor to guide or     monitor treatment for     MRSA infections.  GLUCOSE, CAPILLARY     Status: Abnormal   Collection Time    08/04/14 12:02 PM      Result Value Ref Range   Glucose-Capillary 152 (*) 70 - 99 mg/dL  TYPE AND SCREEN     Status: None   Collection Time    08/04/14  3:22 PM      Result Value Ref Range   ABO/RH(D) AB NEG     Antibody Screen NEG     Sample Expiration 08/07/2014    GLUCOSE, CAPILLARY     Status: Abnormal   Collection Time    08/04/14  5:14 PM      Result Value Ref Range   Glucose-Capillary 101 (*) 70 - 99 mg/dL  GLUCOSE, CAPILLARY     Status: None   Collection Time    08/04/14  7:22 PM      Result Value Ref Range   Glucose-Capillary 99  70 - 99 mg/dL  GLUCOSE, CAPILLARY     Status: Abnormal   Collection Time    08/04/14 11:50 PM      Result Value Ref Range   Glucose-Capillary 159 (*) 70 - 99 mg/dL  GLUCOSE, CAPILLARY     Status: Abnormal   Collection Time    08/05/14  5:36 AM      Result Value Ref Range   Glucose-Capillary 198 (*) 70 - 99 mg/dL    Imaging / Studies: Dg Abd 2 Views  08/04/2014   CLINICAL DATA:  History of rectal adenocarcinoma status post radiation and chemotherapy, diverting ileostomy status post reversal, no bowel sounds, evaluate for SBO  EXAM: ABDOMEN - 2 VIEW  COMPARISON:  08/02/2014  FINDINGS: Enteric tube terminates in the distal gastric antrum/ proximal duodenum.   Mildly prominent loops of small bowel in the left mid abdomen with air-fluid levels, suggesting partial small bowel obstruction or small bowel enteritis.  No evidence of free air under the diaphragm on the upright view.  Residual contrast in the right colon/distal small bowel in the right mid abdomen.  Surgical hardware related to prior ORIF of the left proximal femur  IMPRESSION: Enteric tube terminates in the distal gastric antrum/proximal duodenum.  Dilated loops of small bowel with air-fluid levels in the left mid abdomen, suggesting partial small bowel obstruction or small bowel enteritis.  No free air.   Electronically Signed   By: Julian Hy M.D.   On: 08/04/2014 10:56    Medications / Allergies: per chart  Antibiotics: Anti-infectives   Start     Dose/Rate Route Frequency Ordered Stop   08/04/14 2200  piperacillin-tazobactam (ZOSYN) IVPB 3.375 g     3.375 g 12.5 mL/hr over 240 Minutes Intravenous Every 8 hours 08/04/14 2139     07/31/14 1200  [MAR Hold]  piperacillin-tazobactam (ZOSYN) IVPB 3.375 g  Status:  Discontinued     (On MAR Hold since 08/04/14 2058)   3.375 g 12.5 mL/hr over 240 Minutes Intravenous Every 8 hours 07/31/14 1149 08/04/14 2139   07/27/14 2200  piperacillin-tazobactam (ZOSYN) IVPB 2.25 g  Status:  Discontinued     2.25  g 100 mL/hr over 30 Minutes Intravenous 3 times per day 07/27/14 2040 07/31/14 1148       Note: Portions of this report may have been transcribed using voice recognition software. Every effort was made to ensure accuracy; however, inadvertent computerized transcription errors may be present.   Any transcriptional errors that result from this process are unintentional.

## 2014-08-05 NOTE — Progress Notes (Signed)
PARENTERAL NUTRITION CONSULT NOTE - Follow-up  Pharmacy Consult for New TPN Indication: Prolonged ileus  Allergies  Allergen Reactions  . Ointment Base [Lanolin-Petrolatum] Rash    Specifically burn ointments.    Patient Measurements: Height: 5\' 5"  (165.1 cm) Weight: 147 lb 0.8 oz (66.7 kg) IBW/kg (Calculated) : 57 Adjusted Body Weight: 62 kg Usual Weight: 73 kg  Vital Signs: Temp: 99.2 F (37.3 C) (09/06 0400) Temp src: Oral (09/06 0400) BP: 127/79 mmHg (09/06 0600) Pulse Rate: 107 (09/06 0600) Intake/Output from previous day: 09/05 0701 - 09/06 0700 In: 3077.8 [I.V.:1800; NG/GT:600; IV Piggyback:50; TPN:597.8] Out: 550 [Urine:550] Intake/Output from this shift:    Labs:  Recent Labs  08/02/14 1005 08/03/14 0436  WBC 8.5 9.4  HGB 10.0* 9.6*  HCT 29.5* 28.5*  PLT 344 363     Recent Labs  08/02/14 1005 08/03/14 0436 08/04/14 0445  NA 139 138 138  K 3.4* 3.1* 3.3*  CL 93* 90* 90*  CO2 31 32 34*  GLUCOSE 132* 145* 150*  BUN 14 19 24*  CREATININE 1.28* 1.53* 1.50*  CALCIUM 9.3 9.1 9.5  MG  --  1.6 2.3  PHOS  --  4.5  --   PROT  --  6.9  --   ALBUMIN  --  2.5*  --   AST  --  34  --   ALT  --  47*  --   ALKPHOS  --  87  --   BILITOT  --  0.4  --   PREALBUMIN  --  12.9*  --   TRIG  --  170*  --    Estimated Creatinine Clearance: 35 ml/min (by C-G formula based on Cr of 1.5).    Recent Labs  08/04/14 1922 08/04/14 2350 08/05/14 0536  GLUCAP 99 159* 198*    Medical History: Past Medical History  Diagnosis Date  . Medical history non-contributory   . HDL deficiency   . Allergy     seasonal allergic rhinitis  . History of radiation therapy 10/16/13-11/27/13    rectal 50.4Gy  . Peripheral vascular disease 4/15    DVT LEFT LEG  . Cancer 09/19/13    rectum    Medications:  Infusions:  . 0.9 % NaCl with KCl 40 mEq / L    . Marland KitchenTPN (CLINIMIX-E) Adult 65 mL/hr at 08/05/14 0600   And  . fat emulsion 250 mL (08/05/14 0600)    Insulin  Requirements:  5 units overnight; no Hx DM  Current Nutrition:NPO  IVF: NS w/ 20 mEq K @ 25 ml/hr  Central access: PICC TPN start date: 9/3  ASSESSMENT                                                                                                          HPI: 63 year old female with history of rectal adenocarcinoma, status post resection and diverting ileostomy in 12/2013 with reversal on 06/07/2014, Recent hospitalization for small bowel obstruction 07/11/14 through 07/14/2014 who presented from cancer center with N/V, FTT.  Her hospital course was  complicated with hyponatremia and acute renal failure, though both appear to be back to baseline at this time.  Ileus/partial SBO has prevented meaningful nutrition over the previous 2-3 weeks and here as well, and pharmacy was consulted to begin TPN.  Significant events:  9/3: noted to have some flatus.  Electrolyte, renal disturbances resolved 9/4: K low despite supplementation yesterday; likely due to hypoMg; phos corrected. Clinimix 5/20 increased to 50mg /hr.  9/5: TPN increased to goal. TPN held while pt in surgery.  Today 9/6:   Glucose: CBGs 150-198  Electrolytes - improved, WNL.Corr Ca WNL  Renal - Was at baseline but now trending back up; CrCl 44 ml/min  LFTs - WNL (9/4)  TGs - 170 (9/4)  Prealbumin - 12.9 (9/4)  T. Bili wnl (9/4)  NUTRITIONAL GOALS                                                                                             RD recs:  Kcal: 1700-1800  Protein: 80-90 gm  Fluid: 1.7L daily  Clinimix 5/20 at goal rate of 65 ml/hr with 20% lipids at 10 ml/hr; goal rate would provide 1852 kcal (100% est kcal needs), 78 gram protein (98% est protein needs)  PLAN                                                                                                                         At 1800 today:  Continue Clinimix 5/20 at goal of 65 ml/hr  20% fat emulsion at 10 ml/hr.  TNA to contain standard multivitamins  and trace elements.  Continue IVF @ 10 ml/hr.  Continue SSI w/ q6hr checks moderate resistance scale  TNA lab panels on Mondays & Thursdays.  F/u daily.  Kizzie Furnish, PharmD Pager: [phone] 08/05/2014 7:42 AM

## 2014-08-05 NOTE — Progress Notes (Signed)
Victoria [name]   DOB:13-Apr-1951   TK#:[id number]    Subjective: She is in moderate amount of pain. She felt that the pain medicine is helpful. She appears sedated and not engaging in conversation. She denies nausea.  Objective:  Filed Vitals:   08/05/14 1300  BP: 125/74  Pulse: 109  Temp:   Resp: 30     Intake/Output Summary (Last 24 hours) at 08/05/14 1503 Last data filed at 08/05/14 1223  Gross per 24 hour  Intake 3287.8 ml  Output    500 ml  Net 2787.8 ml    GENERAL:alert, but appears quite sedated. SKIN: skin color, texture, turgor are normal, no rashes or significant lesions EYES: normal, Conjunctiva are pink and non-injected, sclera clear OROPHARYNX:no exudate, no erythema and lips, buccal mucosa, and tongue normal  NECK: supple, thyroid normal size, non-tender, without nodularity LYMPH:  no palpable lymphadenopathy in the cervical, axillary or inguinal LUNGS: clear to auscultation and percussion with normal breathing effort HEART: Tachycardia and no murmurs and no lower extremity edema ABDOMEN:abdomen soft, gastrostomy tube in situ. Reduced bowel sounds. Musculoskeletal:no cyanosis of digits and no clubbing  NEURO: alert & oriented x 3 with fluent speech, no focal motor/sensory deficits   Labs:  Lab Results  Component Value Date   WBC 9.4 08/03/2014   HGB 9.6* 08/03/2014   HCT 28.5* 08/03/2014   MCV 79.2 08/03/2014   PLT 363 08/03/2014   NEUTROABS 7.6 08/03/2014    Lab Results  Component Value Date   NA 139 08/05/2014   K 3.9 08/05/2014   CL 98 08/05/2014   CO2 28 08/05/2014    Studies:  Dg Abd 2 Views  08/04/2014   CLINICAL DATA:  History of rectal adenocarcinoma status post radiation and chemotherapy, diverting ileostomy status post reversal, no bowel sounds, evaluate for SBO  EXAM: ABDOMEN - 2 VIEW  COMPARISON:  08/02/2014  FINDINGS: Enteric tube terminates in the distal gastric antrum/ proximal duodenum.  Mildly prominent loops of small bowel in the left mid abdomen  with air-fluid levels, suggesting partial small bowel obstruction or small bowel enteritis.  No evidence of free air under the diaphragm on the upright view.  Residual contrast in the right colon/distal small bowel in the right mid abdomen.  Surgical hardware related to prior ORIF of the left proximal femur  IMPRESSION: Enteric tube terminates in the distal gastric antrum/proximal duodenum.  Dilated loops of small bowel with air-fluid levels in the left mid abdomen, suggesting partial small bowel obstruction or small bowel enteritis.  No free air.   Electronically Signed   By: Julian Hy M.D.   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 Her disease is significant and diffuse with bulky areas in the pelvis. I do not believe that debulking surgery can be performed to a meaningful degree especially with significant adhesions & prior exposure to radiation. HIPEC treatment is best to be given after maximum debulking surgery. The patient may benefit from systemic chemotherapy if she desire to pursue additional therapy. I would defer to Dr. Carin Hock opinion and discussion with the patient and family. In my experience, patient with diffuse peritoneal carcinomatosis in general has a very poor prognosis and poor tolerance to treatment. The patient is sedated and not able to maintain a meaningful  conversation about goals of therapy. For now, I recommend continue maximum supportive care with total parenteral nutrition, IV fluid resuscitation and pain management.  [facility], Reader, MD 08/05/2014  3:03 PM

## 2014-08-05 NOTE — Progress Notes (Signed)
[place] for Lovenox Indication: History of DVT  Allergies  Allergen Reactions  . Ointment Base [Lanolin-Petrolatum] Rash    Specifically burn ointments.    Patient Measurements: Height: 5\' 5"  (165.1 cm) Weight: 147 lb 0.8 oz (66.7 kg) IBW/kg (Calculated) : 57 Heparin Dosing Weight:   Vital Signs: Temp: 98.4 F (36.9 C) (09/06 0800) Temp src: Oral (09/06 0800) BP: 136/83 mmHg (09/06 0900) Pulse Rate: 113 (09/06 0900)  Labs:  Recent Labs  08/02/14 1005 08/03/14 0436 08/04/14 0445 08/05/14 0835  HGB 10.0* 9.6*  --   --   HCT 29.5* 28.5*  --   --   PLT 344 363  --   --   CREATININE 1.28* 1.53* 1.50* 1.52*    Estimated Creatinine Clearance: 34.5 ml/min (by C-G formula based on Cr of 1.52).   Medical History: Past Medical History  Diagnosis Date  . Medical history non-contributory   . HDL deficiency   . Allergy     seasonal allergic rhinitis  . History of radiation therapy 10/16/13-11/27/13    rectal 50.4Gy  . Peripheral vascular disease 4/15    DVT LEFT LEG  . Cancer 09/19/13    rectum    Medications:  Scheduled:  . antiseptic oral rinse  7 mL Mouth Rinse q12n4p  . chlorhexidine  15 mL Mouth Rinse BID  . cycloSPORINE  1 drop Both Eyes BID  . HYDROmorphone PCA 0.3 mg/mL   Intravenous 6 times per day  . insulin aspart  0-15 Units Subcutaneous 4 times per day  . sodium chloride  1,000 mL Intravenous Once  . sodium chloride  10-40 mL Intracatheter Q12H   Infusions:  . 0.9 % NaCl with KCl 40 mEq / L 50 mL/hr (08/05/14 0900)  . Marland KitchenTPN (CLINIMIX-E) Adult 65 mL/hr at 08/05/14 0600   And  . fat emulsion 250 mL (08/05/14 0600)   PRN: acetaminophen, diphenhydrAMINE, HYDROmorphone (DILAUDID) injection, ipratropium-albuterol, lactated ringers, magnesium hydroxide, methocarbamol (ROBAXIN) IV, methocarbamol (ROBAXIN) IV, naloxone, ondansetron (ZOFRAN) IV, ondansetron (ZOFRAN) IV, simethicone, sodium chloride, sodium  chloride  Assessment: Pt with h/o rectal cancer and now with liver mets was admitted from cancer center for failure to thrive and dehydration. Pt was on Lovenox 100mg  SQ Q24h PTA for L leg DVT diagnosed in 03/15/14 with last dose 8/27@1730 .  Noted to be in AKI at admission and lovenox dosed on anti-Xa levels.  Renal function resolved to baseline on 9/3, but SCr did increase last night, however.  Also, patient has prolonged ileus/pSBO on TPN.  Per Surgery, may need to perform laparoscopic evaluation or liver Bx tomorrow.  Spoke with Dr. Marcello Moores; would like to hold lovenox starting tonight 9/4.   CBC wnl, stable (9/4)  SCr stable at 1.5; CrCl 38 ml/min  Significant Events: 9/4: Lovenox held in anticipation of surgery on 9/5 9/5: surgery today: diagnostic laproscopy, laproscopic lysis of adhesions, peritoneal biopsy of carcinomatosis, laproscopic placement of gastrostomy tube  Goal of Therapy:  Appropriate anticoagulation for patient with hx of DVT Monitor PLT with anticoagulation protocol: Yes   Plan:   Continue Lovenox 100mg  subq Q24H  Follow renal function, CBC   Kizzie Furnish, PharmD Pager: [phone] 08/05/2014 9:48 AM

## 2014-08-06 DIAGNOSIS — Z931 Gastrostomy status: Secondary | ICD-10-CM

## 2014-08-06 DIAGNOSIS — C800 Disseminated malignant neoplasm, unspecified: Secondary | ICD-10-CM

## 2014-08-06 DIAGNOSIS — C7800 Secondary malignant neoplasm of unspecified lung: Secondary | ICD-10-CM

## 2014-08-06 LAB — CBC
HEMATOCRIT: 25.3 % — AB (ref 36.0–46.0)
HEMOGLOBIN: 8.3 g/dL — AB (ref 12.0–15.0)
MCH: 26.5 pg (ref 26.0–34.0)
MCHC: 32.8 g/dL (ref 30.0–36.0)
MCV: 80.8 fL (ref 78.0–100.0)
Platelets: 183 10*3/uL (ref 150–400)
RBC: 3.13 MIL/uL — ABNORMAL LOW (ref 3.87–5.11)
RDW: 16.2 % — AB (ref 11.5–15.5)
WBC: 19.7 10*3/uL — ABNORMAL HIGH (ref 4.0–10.5)

## 2014-08-06 LAB — COMPREHENSIVE METABOLIC PANEL
ALT: 21 U/L (ref 0–35)
ANION GAP: 12 (ref 5–15)
AST: 20 U/L (ref 0–37)
Albumin: 2.1 g/dL — ABNORMAL LOW (ref 3.5–5.2)
Alkaline Phosphatase: 73 U/L (ref 39–117)
BUN: 34 mg/dL — AB (ref 6–23)
CALCIUM: 8.8 mg/dL (ref 8.4–10.5)
CO2: 28 mEq/L (ref 19–32)
CREATININE: 1.3 mg/dL — AB (ref 0.50–1.10)
Chloride: 100 mEq/L (ref 96–112)
GFR, EST AFRICAN AMERICAN: 50 mL/min — AB (ref >90)
GFR, EST NON AFRICAN AMERICAN: 43 mL/min — AB (ref >90)
GLUCOSE: 168 mg/dL — AB (ref 70–99)
Potassium: 3.9 mEq/L (ref 3.7–5.3)
SODIUM: 140 meq/L (ref 137–147)
TOTAL PROTEIN: 6.4 g/dL (ref 6.0–8.3)
Total Bilirubin: 0.4 mg/dL (ref 0.3–1.2)

## 2014-08-06 LAB — PREALBUMIN: PREALBUMIN: 11.4 mg/dL — AB (ref 17.0–34.0)

## 2014-08-06 LAB — MAGNESIUM: Magnesium: 2.1 mg/dL (ref 1.5–2.5)

## 2014-08-06 LAB — GLUCOSE, CAPILLARY
Glucose-Capillary: 170 mg/dL — ABNORMAL HIGH (ref 70–99)
Glucose-Capillary: 163 mg/dL — ABNORMAL HIGH (ref 70–99)

## 2014-08-06 LAB — DIFFERENTIAL
BASOS PCT: 0 % (ref 0–1)
Basophils Absolute: 0 10*3/uL (ref 0.0–0.1)
EOS ABS: 0 10*3/uL (ref 0.0–0.7)
EOS PCT: 0 % (ref 0–5)
Lymphocytes Relative: 3 % — ABNORMAL LOW (ref 12–46)
Lymphs Abs: 0.6 10*3/uL — ABNORMAL LOW (ref 0.7–4.0)
Monocytes Absolute: 0.7 10*3/uL (ref 0.1–1.0)
Monocytes Relative: 3 % (ref 3–12)
NEUTROS PCT: 94 % — AB (ref 43–77)
Neutro Abs: 18.5 10*3/uL — ABNORMAL HIGH (ref 1.7–7.7)

## 2014-08-06 LAB — PHOSPHORUS: PHOSPHORUS: 3.1 mg/dL (ref 2.3–4.6)

## 2014-08-06 LAB — TRIGLYCERIDES: Triglycerides: 105 mg/dL (ref <150)

## 2014-08-06 MED ORDER — TRACE MINERALS CR-CU-F-FE-I-MN-MO-SE-ZN IV SOLN
INTRAVENOUS | Status: AC
  Administered 2014-08-06: 17:00:00 via INTRAVENOUS
  Filled 2014-08-06 (×2): qty 2000

## 2014-08-06 MED ORDER — POTASSIUM CHLORIDE IN NACL 40-0.9 MEQ/L-% IV SOLN
INTRAVENOUS | Status: DC
  Administered 2014-08-07 – 2014-08-11 (×2): 10 mL/h via INTRAVENOUS
  Filled 2014-08-06 (×4): qty 1000

## 2014-08-06 MED ORDER — FAT EMULSION 20 % IV EMUL
250.0000 mL | INTRAVENOUS | Status: AC
  Administered 2014-08-06: 250 mL via INTRAVENOUS
  Filled 2014-08-06 (×2): qty 250

## 2014-08-06 NOTE — Progress Notes (Signed)
Patient ID: Victoria [name], female   DOB: 22-Aug-1951, 63 y.o.   MRN: [id number] [facility] PROGRESS NOTE  Victoria [name] RWE:[id number] DOB: Feb 29, 1951 DOA: 07/27/2014 PCP: Irven Shelling, MD  Brief narrative: 63 year old female with history of rectal adenocarcinoma, status post radiation and chemotherapy, status post resection and diverting ileostomy in 12/2013, ileostomy reversal on 06/07/2014, history of DVT on anticoagulation with Lovenox, recent hospitalization for small bowel obstruction 07/11/14 through 07/14/2014 who presented from cancer center with nausea and vomiting, failure to thrive. Abdominal US showed possible acute cholecystitis for which she was started on zosyn. In addition, she was found to have severe hyponatremia of 107 but it has improved with IV normal saline. Her creatinine was 4.62 on admission which was thought to be reflective of recent CT abdomen with contrast she had 07/18/2014.  Her hospital course is complicated with ongoing small bowel obstruction. She underwent diagnostic laparoscopy on 08/04/2014 with PEG placement.   Assessment & Plan   Principal Problem:  Abdominal distention / Small bowel obstruction / Peritoneal carcinomatosis  Initial abd x ray showed gaseous distention and NG tube was placed for decompression. Patient continued to have quite a significant output through NG tube. She underwent diagnostic laparoscopy with lysis of adhesions, peritoneal biopsy of carcinomatosis and laparoscopi placement of PEG, 08/04/2014. Surgeries following. Appreciate oncology following and their recommendations. Most recent recommendation is for hospice care to follow at home on discharge Pain management with PCA dilaudid. NPO.  Patient is stable for transfer to medical floor today. Active Problems:  Acute cholecystitis  Acute cholecystitis noted on abdominal US. Pt started on empiric zosyn on admission. Zosyn stopped 08/04/2014. Rectal adenocarcinoma with  liver metastases / Peritoneal carcinomatosis  Recent CT abdomen done 07/18/2014 with findings of new small right pleural effusion; stable 5 mm posterior right upper lobe lung nodule; gallstones noted within the gallbladder; marked gallbladder wall thickening measuring up to 11 mm in thickness at the fundus; abnormal low density noted in the adjacent inferior liver with small scattered satellite nodules in the adjacent liver. Findings within the liver new since the prior study. Small amount of perihepatic fluid adjacent to the right hepatic lobe and gallbladder. Extensive edema/stranding within the pelvis extending from the presacral space anteriorly. New area of sclerosis right pubic bone.  As mentioned, per recent oncology recommendation plan is for home hospice on discharge. Acute renal failure / Contrast induced nephropathy  Possibly contrast induced nephropathy  Creatinine improved with IV fluids. Creatinine today is 1.30. Patient was seen by nephrology in consultation. Hypochloremic hyponatremia / metabolic alkalosis / hypokalemia  Likely dehydration, GI losses  Sodium improved with normal saline from 107 to normal  Metabolic alkalosis resolved.  Potassium repleted through IV fluids. Potassium is WNL this am. Anemia of chronic disease  Likely due to history of malignancy. Hemoglobin is 8.3 this morning. No current indications for transfusion. 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  Severe protein calorie malnutrition  Nutrition consulted. Continue TNA for nutritional support.  DVT prophylaxis  On therapeutic anticoagulation with Lovenox.   Code Status: Full.  Family Communication: plan of care discussed with the patient and her husband at the bedside  Disposition Plan: Transfer to medical floor today.   IV Access / Drains   Peripheral IV  PICC line  PEG tube placed 08/04/2014  Procedures and diagnostic studies:   US Abdomen Complete 07/28/2014 1. Gallstones with  1 stone measuring 1.7 cm lodged within the gallbladder neck. Wall  is thickened to 5.2 mm. Multiple echogenic foci project along the gallbladder wall. This could reflect a air. Findings support acute cholecystitis in the proper clinical setting. 2. Hypoechoic liver lesions which are nonspecific. They do not appear to be cysts. Largest lies in the right lobe measuring 15 mm. Liver shows a diffusely coarsened increased echotexture consistent with hepatic steatosis. 3. No bile duct dilation.  Abdominal X ray 07/29/2014 - abdominal distention likely due to gaseous distention in stomach. Consider NG tube for decompression.  Dg Abd Portable 1v 07/31/2014 Interval decompression of the stomach with NG tube. Persistent gaseous distention of multiple loops of small bowel.  TNA 08/02/2014  Dg Abd 2 Views 08/04/2014 Enteric tube terminates in the distal gastric antrum/proximal duodenum. Dilated loops of small bowel with air-fluid levels in the left mid abdomen, suggesting partial small bowel obstruction or small bowel enteritis. No free air.  DIAGNOSTIC LAPAROSCOPY 08/04/2014; LAPAROSCOPIC LYSIS OF ADHESIONS; PERITONEAL BIOPSY OF CARCINOMATOSIS; LAPAROSCOPIC PLACEMENT OF GASTROSTOMY TUBE   Medical Consultants:   Oncology (Dr. Betsy Coder)  Nephrology (Dr. Roney Jaffe)  Surgery (Dr. Leighton Ruff and Dr. Michael Boston)   Other Consultants:   Nutrition   Anti-Infectives:   Zosyn 07/27/2014 --> 08/04/2014   Leisa Lenz, MD  [facility] Pager [phone]  If 7PM-7AM, please contact night-coverage [email or web address] Password [email or web address] 08/06/2014, 10:18 AM   LOS: 10 days    HPI/Subjective: No acute overnight events.  Objective: Filed Vitals:   08/06/14 0700 08/06/14 0800 08/06/14 0801 08/06/14 0900  BP: 140/78 135/75  134/76  Pulse: 110 106  103  Temp:  98.6 F (37 C)    TempSrc:  Axillary    Resp: 36 32 23 23  Height:      Weight:      SpO2: 100% 100% 98% 98%    Intake/Output Summary (Last 24 hours)  at 08/06/14 1018 Last data filed at 08/06/14 0900  Gross per 24 hour  Intake 3430.16 ml  Output   1125 ml  Net 2305.16 ml    Exam:   General:  Pt is alert, no acute distress  Cardiovascular: Tachycardic, S1/S2, no murmurs  Respiratory: Bilateral air entry, no wheezing  Abdomen: Abdomen firm to palpation, some tenderness in mid abdomen, bowel sounds present  Extremities: Trace lower extremity nonpitting edema, pulses DP and PT palpable bilaterally  Neuro: Grossly nonfocal  Data Reviewed: Basic Metabolic Panel:  Recent Labs Lab 07/30/14 1420  08/01/14 0435 08/02/14 1005 08/03/14 0436 08/04/14 0445 08/05/14 0835 08/06/14 0520  NA 128*  < > 138 139 138 138 139 140  K 2.5*  < > 3.6* 3.4* 3.1* 3.3* 3.9 3.9  CL 75*  < > 92* 93* 90* 90* 98 100  CO2 29  < > 27 31 32 34* 28 28  GLUCOSE 163*  < > 94 132* 145* 150* 178* 168*  BUN 30*  < > 19 14 19  24* 30* 34*  CREATININE 1.90*  < > 1.22* 1.28* 1.53* 1.50* 1.52* 1.30*  CALCIUM 7.4*  < > 9.1 9.3 9.1 9.5 8.8 8.8  MG  --   --   --   --  1.6 2.3  --  2.1  PHOS 2.1*  --  1.7*  --  4.5  --   --  3.1  < > = values in this interval not displayed. Liver Function Tests:  Recent Labs Lab 07/30/14 1420 07/31/14 0442 08/01/14 0435 08/03/14 0436 08/06/14 0520  AST  --  19  --  34  20  ALT  --  15  --  47* 21  ALKPHOS  --  76  --  87 73  BILITOT  --  0.4  --  0.4 0.4  PROT  --  6.9  --  6.9 6.4  ALBUMIN 2.1* 2.5* 2.4* 2.5* 2.1*   No results found for this basename: LIPASE, AMYLASE,  in the last 168 hours No results found for this basename: AMMONIA,  in the last 168 hours CBC:  Recent Labs Lab 07/31/14 0442 08/01/14 0435 08/02/14 1005 08/03/14 0436 08/06/14 0520  WBC 8.5 9.1 8.5 9.4 19.7*  NEUTROABS  --   --   --  7.6 18.5*  HGB 9.9* 9.8* 10.0* 9.6* 8.3*  HCT 30.8* 29.3* 29.5* 28.5* 25.3*  MCV 80.6 79.0 77.6* 79.2 80.8  PLT 376 413* 344 363 183   Cardiac Enzymes: No results found for this basename: CKTOTAL, CKMB,  CKMBINDEX, TROPONINI,  in the last 168 hours BNP: No components found with this basename: POCBNP,  CBG:  Recent Labs Lab 08/05/14 0810 08/05/14 1241 08/05/14 1726 08/05/14 2346 08/06/14 0606  GLUCAP 197* 186* 192* 166* 170*    MRSA PCR SCREENING     Status: None   Collection Time    07/29/14  9:33 AM      Result Value Ref Range Status   MRSA by PCR NEGATIVE  NEGATIVE Final  MRSA PCR SCREENING     Status: None   Collection Time    08/04/14 10:30 AM      Result Value Ref Range Status   MRSA by PCR NEGATIVE  NEGATIVE Final     Scheduled Meds: . enoxaparin (LOVENOX) injection  100 mg Subcutaneous Q24H  . HYDROmorphone PCA 0.3 mg/mL   Intravenous 6 times per day  . insulin aspart  0-15 Units Subcutaneous 4 times per day   Continuous Infusions: . 0.9 % NaCl with KCl 40 mEq / L 10 mL/hr (08/06/14 0745)  . Marland KitchenTPN (CLINIMIX-E) Adult 65 mL/hr at 08/05/14 2300   And  . fat emulsion 250 mL (08/05/14 2300)  . Marland KitchenTPN (CLINIMIX-E) Adult     And  . fat emulsion          n

## 2014-08-06 NOTE — Progress Notes (Addendum)
[facility]  [address] [place], [place] [address] Phone: [phone] FAX: [phone]    SUAD AUTREY [id number] [birth year]  CARE TEAM:  PCP: Irven Shelling, MD  Outpatient Care Team: Patient Care Team: Irven Shelling, MD as PCP - General (Internal Medicine) Carola Frost, RN as Registered Nurse Ladell Pier, MD as Consulting Physician (Oncology)  Inpatient Treatment Team: Treatment Team: Attending Provider: Robbie Lis, MD; Attending Physician: Robbie Lis, MD; Consulting Physician: Ladell Pier, MD; Rounding Team: Joycelyn Das, MD; Registered Nurse: Kayleen Memos, RN; Consulting Physician: Leighton Ruff, MD; Registered Nurse: Rise Patience, RN; Registered Nurse: Leeroy Cha, RN; Consulting Physician: Nolon Nations, MD; Registered Nurse: Eston Mould, RN; Consulting Physician: Heath Lark, MD; Registered Nurse: Meredith Mody, RN   Subjective:  Comfortable.  PCA available Heart rate better. Husband in room  Objective:  Vital signs:  Filed Vitals:   08/06/14 0000 08/06/14 0400 08/06/14 0410 08/06/14 0500  BP: 130/89 135/80    Pulse: 102 101 100   Temp: 98.8 F (37.1 C)  98.9 F (37.2 C)   TempSrc: Oral  Oral   Resp: 20 26 34   Height:      Weight:    160 lb 0.9 oz (72.6 kg)  SpO2: 99% 100% 100%     Last BM Date: 07/29/14  Intake/Output   Yesterday:  09/06 0701 - 09/07 0700 In: 3192.7 [P.O.:360; I.V.:510; NG/GT:240; TPN:1722.7] Out: 1125 [Urine:1125] This shift:     Bowel function:  Flatus: n  BM: n  Drain: G tube flushes well.  Returns better through med port.  Min output for now  Physical Exam:  General: Pt awake/more alert/oriented x4 in mild distress. Tired & sickly.  Calm Eyes: PERRL, normal EOM.  Sclera clear.  No icterus Neuro: CN II-XII intact w/o focal sensory/motor deficits. Lymph: No head/neck/groin lymphadenopathy Psych:  No  delerium/psychosis/paranoia.  HENT: Normocephalic, Mucus membranes moist.  No thrush Neck: Supple, No tracheal deviation Chest: No chest wall pain w good excursion CV:  Pulses intact.  Regular rhythm MS: Normal AROM mjr joints.  No obvious deformity Abdomen: Firn & distended.  Min tender diffuse.  No evidence of peritonitis.  No incarcerated hernias. Ext:  SCDs BLE.  No mjr edema.  No cyanosis Skin: No petechiae / purpura   Problem List:   Principal Problem:   Carcinomatosis peritonei causing malignant SBO Active Problems:   Rectal cancer - metastatic ([id number]) s/p lap LAR/loop ileostomy 2014   DVT (deep venous thrombosis)   Adult failure to thrive   Acute renal failure   Protein-calorie malnutrition, severe   Hyponatremia   Cholecystitis, acute   Anemia of chronic disease   SBO (small bowel obstruction)   Gastrostomy tube in place   Assessment  Victoria [name]  63 y.o. female  2 Days Post-Op   POST-OPERATIVE DIAGNOSIS: CARCINOMATOSIS with malignant SBO   PROCEDURE: Procedure(s):  DIAGNOSTIC LAPAROSCOPY  LAPAROSCOPIC LYSIS OF ADHESIONS  PERITONEAL BIOPSY OF CARCINOMATOSIS  LAPAROSCOPIC PLACEMENT OF GASTROSTOMY TUBE   SURGEON: Michael Boston, MD   Malignant SBO from carcinomatosis s/p lap BX & palliative G tube with persistent obstipation.  Plan:  -palliative G tube decompession - switched to med port for better decompression -palliate pain/nausea (not bad today) -less dehydrated.  Prob 3rd spacing after surgery.  Check BMET -TNA for now -inc WBC - concerning but believe pt had steroids - no evid of perioinitis  -  VTE Tx - restart full dose enoxaparin & follow closely -mobilize as tolerated to help recovery -OK to transfer to floor if primary med service agrees  I updated the patient's status to the patient, her husband.  Recommendations were made.  Questions were answered.  The patient expressed understanding & appreciation.    Followed by for medical  oncology.  Prognosis grim. Palliative care/Hospice vs palliative chemoTx.  Pt thinking about alternative cancer center for 2nd opinion...     Adin Hector, M.D., F.A.C.S. Gastrointestinal and Minimally Invasive Surgery [facility] [address] [place], [place] [address] [phone] Main / Paging   08/06/2014   Results:   Labs: Results for orders placed during the hospital encounter of 07/27/14 (from the past 48 hour(s))  MRSA PCR SCREENING     Status: None   Collection Time    08/04/14 10:30 AM      Result Value Ref Range   MRSA by PCR NEGATIVE  NEGATIVE   Comment:            The GeneXpert MRSA Assay (FDA     approved for NASAL specimens     only), is one component of a     comprehensive MRSA colonization     surveillance program. It is not     intended to diagnose MRSA     infection nor to guide or     monitor treatment for     MRSA infections.  GLUCOSE, CAPILLARY     Status: Abnormal   Collection Time    08/04/14 12:02 PM      Result Value Ref Range   Glucose-Capillary 152 (*) 70 - 99 mg/dL  TYPE AND SCREEN     Status: None   Collection Time    08/04/14  3:22 PM      Result Value Ref Range   ABO/RH(D) AB NEG     Antibody Screen NEG     Sample Expiration 08/07/2014    GLUCOSE, CAPILLARY     Status: Abnormal   Collection Time    08/04/14  5:14 PM      Result Value Ref Range   Glucose-Capillary 101 (*) 70 - 99 mg/dL  GLUCOSE, CAPILLARY     Status: None   Collection Time    08/04/14  7:22 PM      Result Value Ref Range   Glucose-Capillary 99  70 - 99 mg/dL  GLUCOSE, CAPILLARY     Status: Abnormal   Collection Time    08/04/14 11:50 PM      Result Value Ref Range   Glucose-Capillary 159 (*) 70 - 99 mg/dL  GLUCOSE, CAPILLARY     Status: Abnormal   Collection Time    08/05/14  5:36 AM      Result Value Ref Range   Glucose-Capillary 198 (*) 70 - 99 mg/dL  GLUCOSE, CAPILLARY     Status: Abnormal   Collection Time     08/05/14  8:10 AM      Result Value Ref Range   Glucose-Capillary 197 (*) 70 - 99 mg/dL  BASIC METABOLIC PANEL     Status: Abnormal   Collection Time    08/05/14  8:35 AM      Result Value Ref Range   Sodium 139  137 - 147 mEq/L   Potassium 3.9  3.7 - 5.3 mEq/L   Chloride 98  96 - 112 mEq/L   Comment: DELTA CHECK NOTED   CO2 28  19 - 32 mEq/L   Glucose, Bld 178 (*) 70 - 99 mg/dL   BUN 30 (*) 6 - 23 mg/dL   Creatinine, Ser 1.52 (*) 0.50 - 1.10 mg/dL   Calcium 8.8  8.4 - 10.5 mg/dL   GFR calc non Af Amer 36 (*) >90 mL/min   GFR calc Af Amer 41 (*) >90 mL/min   Comment: (NOTE)     The eGFR has been calculated using the CKD EPI equation.     This calculation has not been validated in all clinical situations.     eGFR's persistently <90 mL/min signify possible Chronic Kidney     Disease.   Anion gap 13  5 - 15  GLUCOSE, CAPILLARY     Status: Abnormal   Collection Time    08/05/14 12:41 PM      Result Value Ref Range   Glucose-Capillary 186 (*) 70 - 99 mg/dL  GLUCOSE, CAPILLARY     Status: Abnormal   Collection Time    08/05/14  5:26 PM      Result Value Ref Range   Glucose-Capillary 192 (*) 70 - 99 mg/dL  GLUCOSE, CAPILLARY     Status: Abnormal   Collection Time    08/05/14 11:46 PM      Result Value Ref Range   Glucose-Capillary 166 (*) 70 - 99 mg/dL  COMPREHENSIVE METABOLIC PANEL     Status: Abnormal   Collection Time    08/06/14  5:20 AM      Result Value Ref Range   Sodium 140  137 - 147 mEq/L   Potassium 3.9  3.7 - 5.3 mEq/L   Chloride 100  96 - 112 mEq/L   CO2 28  19 - 32 mEq/L   Glucose, Bld 168 (*) 70 - 99 mg/dL   BUN 34 (*) 6 - 23 mg/dL   Creatinine, Ser 1.30 (*) 0.50 - 1.10 mg/dL   Calcium 8.8  8.4 - 10.5 mg/dL   Total Protein 6.4  6.0 - 8.3 g/dL   Albumin 2.1 (*) 3.5 - 5.2 g/dL   AST 20  0 - 37 U/L   ALT 21  0 - 35 U/L   Alkaline Phosphatase 73  39 - 117 U/L   Total Bilirubin 0.4  0.3 - 1.2 mg/dL   GFR calc non Af Amer 43 (*) >90 mL/min   GFR calc Af  Amer 50 (*) >90 mL/min   Comment: (NOTE)     The eGFR has been calculated using the CKD EPI equation.     This calculation has not been validated in all clinical situations.     eGFR's persistently <90 mL/min signify possible Chronic Kidney     Disease.   Anion gap 12  5 - 15  MAGNESIUM     Status: None   Collection Time    08/06/14  5:20 AM      Result Value Ref Range   Magnesium 2.1  1.5 - 2.5 mg/dL  PHOSPHORUS     Status: None   Collection Time    08/06/14  5:20 AM      Result Value Ref Range   Phosphorus 3.1  2.3 - 4.6 mg/dL  DIFFERENTIAL     Status: Abnormal   Collection Time    08/06/14  5:20 AM      Result Value Ref Range   Neutrophils Relative % 94 (*) 43 - 77 %   Neutro Abs 18.5 (*) 1.7 - 7.7 K/uL   Lymphocytes  Relative 3 (*) 12 - 46 %   Lymphs Abs 0.6 (*) 0.7 - 4.0 K/uL   Monocytes Relative 3  3 - 12 %   Monocytes Absolute 0.7  0.1 - 1.0 K/uL   Eosinophils Relative 0  0 - 5 %   Eosinophils Absolute 0.0  0.0 - 0.7 K/uL   Basophils Relative 0  0 - 1 %   Basophils Absolute 0.0  0.0 - 0.1 K/uL  CBC     Status: Abnormal   Collection Time    08/06/14  5:20 AM      Result Value Ref Range   WBC 19.7 (*) 4.0 - 10.5 K/uL   RBC 3.13 (*) 3.87 - 5.11 MIL/uL   Hemoglobin 8.3 (*) 12.0 - 15.0 g/dL   HCT 25.3 (*) 36.0 - 46.0 %   MCV 80.8  78.0 - 100.0 fL   MCH 26.5  26.0 - 34.0 pg   MCHC 32.8  30.0 - 36.0 g/dL   RDW 16.2 (*) 11.5 - 15.5 %   Platelets 183  150 - 400 K/uL  GLUCOSE, CAPILLARY     Status: Abnormal   Collection Time    08/06/14  6:06 AM      Result Value Ref Range   Glucose-Capillary 170 (*) 70 - 99 mg/dL    Imaging / Studies: Dg Abd 2 Views  08/04/2014   CLINICAL DATA:  History of rectal adenocarcinoma status post radiation and chemotherapy, diverting ileostomy status post reversal, no bowel sounds, evaluate for SBO  EXAM: ABDOMEN - 2 VIEW  COMPARISON:  08/02/2014  FINDINGS: Enteric tube terminates in the distal gastric antrum/ proximal duodenum.  Mildly  prominent loops of small bowel in the left mid abdomen with air-fluid levels, suggesting partial small bowel obstruction or small bowel enteritis.  No evidence of free air under the diaphragm on the upright view.  Residual contrast in the right colon/distal small bowel in the right mid abdomen.  Surgical hardware related to prior ORIF of the left proximal femur  IMPRESSION: Enteric tube terminates in the distal gastric antrum/proximal duodenum.  Dilated loops of small bowel with air-fluid levels in the left mid abdomen, suggesting partial small bowel obstruction or small bowel enteritis.  No free air.   Electronically Signed   By: Julian Hy M.D.   On: 08/04/2014 10:56    Medications / Allergies: per chart  Antibiotics: Anti-infectives   Start     Dose/Rate Route Frequency Ordered Stop   08/04/14 2200  piperacillin-tazobactam (ZOSYN) IVPB 3.375 g  Status:  Discontinued     3.375 g 12.5 mL/hr over 240 Minutes Intravenous Every 8 hours 08/04/14 2139 08/05/14 0900   07/31/14 1200  [MAR Hold]  piperacillin-tazobactam (ZOSYN) IVPB 3.375 g  Status:  Discontinued     (On MAR Hold since 08/04/14 2058)   3.375 g 12.5 mL/hr over 240 Minutes Intravenous Every 8 hours 07/31/14 1149 08/04/14 2139   07/27/14 2200  piperacillin-tazobactam (ZOSYN) IVPB 2.25 g  Status:  Discontinued     2.25 g 100 mL/hr over 30 Minutes Intravenous 3 times per day 07/27/14 2040 07/31/14 1148       Note: Portions of this report may have been transcribed using voice recognition software. Every effort was made to ensure accuracy; however, inadvertent computerized transcription errors may be present.   Any transcriptional errors that result from this process are unintentional.

## 2014-08-06 NOTE — Progress Notes (Addendum)
PARENTERAL NUTRITION CONSULT NOTE - Follow-up  Pharmacy Consult for New TPN Indication: Prolonged ileus  Allergies  Allergen Reactions  . Ointment Base [Lanolin-Petrolatum] Rash    Specifically burn ointments.    Patient Measurements: Height: 5\' 5"  (165.1 cm) Weight: 160 lb 0.9 oz (72.6 kg) IBW/kg (Calculated) : 57 Adjusted Body Weight: 62 kg Usual Weight: 73 kg  Vital Signs: Temp: 98.9 F (37.2 C) (09/07 0410) Temp src: Oral (09/07 0410) BP: 135/80 mmHg (09/07 0400) Pulse Rate: 100 (09/07 0410)  Intake/Output from previous day: 09/06 0701 - 09/07 0700 In: 3192.7 [P.O.:360; I.V.:510; NG/GT:240; TPN:1722.7] Out: 1125 [Urine:1125]  Labs:  Recent Labs  08/06/14 0520  WBC 19.7*  HGB 8.3*  HCT 25.3*  PLT 183     Recent Labs  08/04/14 0445 08/05/14 0835 08/06/14 0520  NA 138 139 140  K 3.3* 3.9 3.9  CL 90* 98 100  CO2 34* 28 28  GLUCOSE 150* 178* 168*  BUN 24* 30* 34*  CREATININE 1.50* 1.52* 1.30*  CALCIUM 9.5 8.8 8.8  MG 2.3  --  2.1  PHOS  --   --  3.1  PROT  --   --  6.4  ALBUMIN  --   --  2.1*  AST  --   --  20  ALT  --   --  21  ALKPHOS  --   --  73  BILITOT  --   --  0.4   Estimated Creatinine Clearance: 44.8 ml/min (by C-G formula based on Cr of 1.3).    Recent Labs  08/05/14 1726 08/05/14 2346 08/06/14 0606  GLUCAP 192* 166* 170*     Medications:  Infusions:  . Marland KitchenTPN (CLINIMIX-E) Adult 65 mL/hr at 08/05/14 2300   And  . fat emulsion 250 mL (08/05/14 2300)    Insulin Requirements:  12 units; no Hx DM  Current Nutrition: NPO  IVF: NS w/ 40 mEq K @ 10 ml/hr  Central access: PICC TPN start date: 9/3  ASSESSMENT                                                                                                          HPI: 63 year old female with history of rectal adenocarcinoma, status post resection and diverting ileostomy in 12/2013 with reversal on 06/07/2014, Recent hospitalization for small bowel obstruction 07/11/14 through  07/14/2014 who presented from cancer center with N/V, FTT.  Her hospital course was complicated with hyponatremia and acute renal failure, though both appear to be back to baseline at this time.  Ileus/partial SBO has prevented meaningful nutrition over the previous 2-3 weeks and here as well, and pharmacy was consulted to begin TPN.  Significant events:  9/3: noted to have some flatus.  Electrolyte, renal disturbances resolved 9/4: K low despite supplementation yesterday; likely due to hypoMg; phos corrected.  9/5: TPN increased to goal. TPN held while pt in surgery. 9/7: hospice referral  Today 9/7:   Glucose: CBGs 166-197  Electrolytes - remain WNL.Corr Ca WNL.  Renal - improved, SCr 1.3  LFTs -  WNL (9/7)  TGs - 170 (9/4)  Prealbumin - 12.9 (9/4)  T. Bili wnl (9/4)  NUTRITIONAL GOALS                                                                                             RD recs:  Kcal: 1700-1800  Protein: 80-90 gm  Fluid: 1.7L daily  Clinimix 5/20 at goal rate of 65 ml/hr with 20% lipids at 10 ml/hr; goal rate would provide 1852 kcal (100% est kcal needs), 78 gram protein (98% est protein needs)  PLAN                                                                                                                         At 1800 today:  Continue Clinimix 5/20 at goal of 65 ml/hr  20% fat emulsion at 10 ml/hr.  TNA to contain standard multivitamins and trace elements.  Continue IVF @ 10 ml/hr.  Continue CBGs and moderate scale SSI q6hr.   TNA lab panels on Mondays & Thursdays.  F/u daily.  MD, Consider cyclic administration if plan will be to continue long-term home TNA?   Gretta Arab PharmD, BCPS Pager [phone] 08/06/2014 7:36 AM

## 2014-08-06 NOTE — Progress Notes (Signed)
CARE MANAGE MENT UTILIZATION REVIEW NOTE 08/06/2014     Patient:  Victoria [name], Victoria [name]   Account Number:  [id number]  Documented by: Patsi Sears, BSN, CCM   Per Ur Regulation Reviewed for med. necessity/level of care/duration of stay

## 2014-08-06 NOTE — Progress Notes (Signed)
IP PROGRESS NOTE  Subjective: She complains of abdominal pain.  Objective: Vital signs in last 24 hours: Temp:  [97.5 F (36.4 C)-99.8 F (37.7 C)] 98.9 F (37.2 C) (09/07 0410) Pulse Rate:  [97-116] 100 (09/07 0410) Resp:  [20-36] 34 (09/07 0410) BP: (110-145)/(72-89) 135/80 mmHg (09/07 0400) SpO2:  [97 %-100 %] 100 % (09/07 0410) FiO2 (%):  [99 %] 99 % (09/06 2349) Weight:  [160 lb 0.9 oz (72.6 kg)] 160 lb 0.9 oz (72.6 kg) (09/07 0500)  Intake/Output from previous day: 09/06 0701 - 09/07 0700 In: 3192.7 [P.O.:360; I.V.:510; NG/GT:240; TPN:1722.7] Out: 1125 [Urine:1125] Intake/Output this shift: Total I/O In: 1470 [P.O.:120; Other:360; NG/GT:240; TPN:750] Out: 550 [Urine:550]  Abdomen distended-left upper quadrant gastrostomy tube  Lab Results:   Recent Labs  08/06/14 0520  WBC 19.7*  HGB 8.3*  HCT 25.3*  PLT 183   BMET  Recent Labs  08/05/14 0835 08/06/14 0520  NA 139 140  K 3.9 3.9  CL 98 100  CO2 28 28  GLUCOSE 178* 168*  BUN 30* 34*  CREATININE 1.52* 1.30*  CALCIUM 8.8 8.8    Studies/Results: Dg Abd 2 Views  20-Aug-2014   CLINICAL DATA:  History of rectal adenocarcinoma status post radiation and chemotherapy, diverting ileostomy status post reversal, no bowel sounds, evaluate for SBO  EXAM: ABDOMEN - 2 VIEW  COMPARISON:  08/02/2014  FINDINGS: Enteric tube terminates in the distal gastric antrum/ proximal duodenum.  Mildly prominent loops of small bowel in the left mid abdomen with air-fluid levels, suggesting partial small bowel obstruction or small bowel enteritis.  No evidence of free air under the diaphragm on the upright view.  Residual contrast in the right colon/distal small bowel in the right mid abdomen.  Surgical hardware related to prior ORIF of the left proximal femur  IMPRESSION: Enteric tube terminates in the distal gastric antrum/proximal duodenum.  Dilated loops of small bowel with air-fluid levels in the left mid abdomen, suggesting partial  small bowel obstruction or small bowel enteritis.  No free air.   Electronically Signed   By: Julian Hy M.D.   On: 20-Aug-2014 10:56      Assessment/Plan: 1. 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  Initiation of radiation and concurrent Xeloda 10/16/2013.  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  Cycle 1 adjuvant Xeloda 02/05/2014.  Cycle 2 adjuvant Xeloda 02/26/2014.  Cycle 3 adjuvant Xeloda 03/18/2014.  Cycle 4 adjuvant Xeloda 04/08/2014.  2. Indeterminate right lung nodule on CT 09/29/2013. 3. History of weight loss.  4. Left leg DVT 03/15/2014. On Lovenox. 5. Admission for ileostomy reversal 06/07/2014. 6. Hospitalization 07/11/2014 through 07/14/2014 with a small bowel obstruction. Patient felt to most likely have a narrow area at her small bowel anastomosis due to inflammation after surgery and not a true obstruction. 7. Elevated CEA 07/14/2014. 8. CT chest/abdomen/pelvis 07/18/2014 with a new small right pleural effusion; stable 5 mm posterior right upper lobe lung nodule; gallstones noted within the gallbladder; marked gallbladder wall thickening measuring up to 11 mm in thickness at the fundus; abnormal low density noted in the adjacent inferior liver with small scattered satellite nodules in the adjacent liver. Findings within the liver new since the prior study. Small amount of perihepatic fluid adjacent to the right hepatic lobe and gallbladder. Extensive edema/stranding within the pelvis extending from the presacral space anteriorly. New area of sclerosis right pubic  bone. Abdominal ultrasound 07/28/2014 concerning for acute cholecystitis 9. Acute renal failure/hyponatremia-seen by nephrology, renal failure improved with hydration. 10. Bowel obstruction requiring NG tube placement-status post a diagnostic laparoscopy 08/04/2012  confirming extensive carcinomatosis, gastrostomy tube placed        LOS: 10 days    [place], ANP/GNP-BC 08/06/2014 6:55 AM  The operative findings from 08/04/2014  are noted. She has been diagnosed with extensive abdominal carcinomatosis and appears to have liver metastases by CT. I discussed the operative findings with Ms. Steinhilber this morning and with her husband by telephone. She is not a candidate for resection of the metastatic disease and there is only a small chance of clinical benefit from systemic chemotherapy. Her performance status is poor. I recommend Hospice care. She agrees to a Hospice referral.  I will see her again in the a.m. on 08/07/2014 for further discussion.  Recommendations:  1. Continue postop care per Dr. Johney Maine 2. [facility] hospice referral for home care

## 2014-08-06 NOTE — Plan of Care (Signed)
Problem: Phase I Progression Outcomes Goal: Voiding-avoid urinary catheter unless indicated Outcome: Not Progressing Foley for strict intake and output monitoring

## 2014-08-07 ENCOUNTER — Encounter (HOSPITAL_COMMUNITY): Payer: Self-pay | Admitting: Surgery

## 2014-08-07 ENCOUNTER — Encounter: Payer: Self-pay | Admitting: Oncology

## 2014-08-07 DIAGNOSIS — G8918 Other acute postprocedural pain: Secondary | ICD-10-CM

## 2014-08-07 DIAGNOSIS — G893 Neoplasm related pain (acute) (chronic): Secondary | ICD-10-CM

## 2014-08-07 LAB — GLUCOSE, CAPILLARY
GLUCOSE-CAPILLARY: 137 mg/dL — AB (ref 70–99)
Glucose-Capillary: 139 mg/dL — ABNORMAL HIGH (ref 70–99)
Glucose-Capillary: 141 mg/dL — ABNORMAL HIGH (ref 70–99)
Glucose-Capillary: 141 mg/dL — ABNORMAL HIGH (ref 70–99)

## 2014-08-07 MED ORDER — TRACE MINERALS CR-CU-F-FE-I-MN-MO-SE-ZN IV SOLN
INTRAVENOUS | Status: AC
  Administered 2014-08-07: 18:00:00 via INTRAVENOUS
  Filled 2014-08-07: qty 2000

## 2014-08-07 MED ORDER — VITAMINS A & D EX OINT
TOPICAL_OINTMENT | CUTANEOUS | Status: AC
  Administered 2014-08-07: 5
  Filled 2014-08-07: qty 5

## 2014-08-07 MED ORDER — FAT EMULSION 20 % IV EMUL
250.0000 mL | INTRAVENOUS | Status: AC
  Administered 2014-08-07: 250 mL via INTRAVENOUS
  Filled 2014-08-07: qty 250

## 2014-08-07 MED ORDER — MORPHINE SULFATE (CONCENTRATE) 10 MG /0.5 ML PO SOLN
10.0000 mg | ORAL | Status: DC | PRN
  Administered 2014-08-09 – 2014-08-10 (×8): 20 mg via SUBLINGUAL
  Filled 2014-08-07 (×8): qty 1

## 2014-08-07 MED ORDER — INSULIN ASPART 100 UNIT/ML ~~LOC~~ SOLN
0.0000 [IU] | Freq: Three times a day (TID) | SUBCUTANEOUS | Status: AC
  Administered 2014-08-07 – 2014-08-09 (×5): 2 [IU] via SUBCUTANEOUS

## 2014-08-07 NOTE — Progress Notes (Signed)
[address] [place], [place] [address] Phone: [phone] FAX: [phone]    MAGNOLIA MATTILA [id number] 1951/10/10  CARE TEAM:  PCP: Irven Shelling, MD  Outpatient Care Team: Patient Care Team: Irven Shelling, MD as PCP - General (Internal Medicine) Carola Frost, RN as Registered Nurse Ladell Pier, MD as Consulting Physician (Oncology)  Inpatient Treatment Team: Treatment Team: Attending Provider: Robbie Lis, MD; Attending Physician: Robbie Lis, MD; Consulting Physician: Ladell Pier, MD; Rounding Team: Joycelyn Das, MD; Registered Nurse: Kayleen Memos, RN; Consulting Physician: Leighton Ruff, MD; Registered Nurse: Rise Patience, RN; Registered Nurse: Leeroy Cha, RN; Consulting Physician: Nolon Nations, MD; Registered Nurse: Noreene Larsson, RN; Technician: Ileene Rubens, NT; Registered Nurse: Suzan Slick, RN   Subjective:  Comfortable.  PCA available.  Wants root beer Husband in room  Objective:  Vital signs:  Filed Vitals:   08/07/14 0007 08/07/14 0418 08/07/14 0542 08/07/14 0816  BP:   124/74   Pulse:   102   Temp:   97.9 F (36.6 C)   TempSrc:   Oral   Resp: 26 27 27 27   Height:      Weight:      SpO2: 99% 100% 100% 100%    Last BM Date: 07/29/14  Intake/Output   Yesterday:  09/07 0701 - 09/08 0700 In: 2158.5 [P.O.:125; I.V.:98.5; TPN:1875] Out: 625 [Urine:625] This shift:     Bowel function:  Flatus: n  BM: n  Drain: G tube flushes well.  Returns better through med port.  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  Plan:  -palliative G tube decompession - can try clamping trials and open as needed for nausea -palliate- pain/nausea control ok right now -TNA for now, recommend weaning to off over the next several days, diet should be full liquids -DVT Tx - cont full dose enoxaparin & follow closely, discuss with Dr Benay Spice on whether she needs this treatment long term -mobilize as tolerated to help recovery -Family's goal is home with hospice -Will see pt on PRN basis.  Please call me if you have any concerns that I can address  I updated the patient's status to the patient, her husband.  Recommendations were made.  Questions were answered.  The patient expressed understanding & appreciation.    Followed by for medical oncology.  Prognosis grim. Palliative care/Hospice vs palliative chemoTx. The pt seems resolved that she does not want chemo. No other surgical options.  Rosario Adie, MD  [facility]    08/07/2014   Results:   Labs: Results for orders placed during the hospital encounter of 07/27/14 (from the past 48 hour(s))  GLUCOSE, CAPILLARY     Status: Abnormal   Collection Time    08/05/14 12:41 PM      Result Value Ref Range   Glucose-Capillary 186 (*) 70 - 99 mg/dL  GLUCOSE, CAPILLARY     Status: Abnormal   Collection Time    08/05/14  5:26 PM       Result Value Ref Range   Glucose-Capillary 192 (*) 70 - 99 mg/dL  GLUCOSE, CAPILLARY     Status: Abnormal   Collection Time    08/05/14 11:46 PM      Result Value Ref Range   Glucose-Capillary 166 (*) 70 - 99 mg/dL  COMPREHENSIVE METABOLIC PANEL     Status: Abnormal   Collection Time    08/06/14  5:20 AM      Result Value Ref Range   Sodium 140  137 - 147 mEq/L   Potassium 3.9  3.7 - 5.3 mEq/L   Chloride 100  96 - 112 mEq/L   CO2 28  19 - 32 mEq/L   Glucose, Bld 168 (*) 70 - 99 mg/dL   BUN 34 (*) 6 - 23 mg/dL   Creatinine, Ser 1.30 (*) 0.50 - 1.10 mg/dL   Calcium 8.8  8.4 - 10.5 mg/dL   Total Protein 6.4  6.0 - 8.3 g/dL   Albumin 2.1 (*) 3.5 - 5.2 g/dL   AST 20  0 - 37 U/L   ALT 21  0 - 35 U/L   Alkaline Phosphatase 73  39 - 117 U/L   Total Bilirubin 0.4  0.3 - 1.2 mg/dL   GFR calc non Af Amer 43 (*) >90 mL/min   GFR calc Af Amer 50 (*) >90 mL/min   Comment: (NOTE)     The eGFR has been calculated using the CKD EPI equation.     This calculation has not been validated in all clinical situations.     eGFR's persistently <90 mL/min signify possible Chronic Kidney     Disease.   Anion gap 12  5 - 15  MAGNESIUM     Status: None   Collection Time    08/06/14  5:20 AM      Result Value Ref Range   Magnesium 2.1  1.5 - 2.5 mg/dL  PHOSPHORUS     Status: None   Collection Time    08/06/14  5:20 AM      Result Value Ref Range   Phosphorus 3.1  2.3 - 4.6 mg/dL  DIFFERENTIAL     Status: Abnormal   Collection Time    08/06/14  5:20 AM      Result Value Ref Range   Neutrophils Relative % 94 (*) 43 - 77 %   Neutro Abs 18.5 (*) 1.7 - 7.7 K/uL   Lymphocytes Relative 3 (*) 12 - 46 %   Lymphs Abs 0.6 (*) 0.7 - 4.0 K/uL   Monocytes Relative 3  3 - 12 %   Monocytes Absolute 0.7  0.1 - 1.0 K/uL   Eosinophils Relative 0  0 - 5 %   Eosinophils Absolute 0.0  0.0 - 0.7 K/uL   Basophils Relative 0  0 - 1 %   Basophils Absolute 0.0  0.0 - 0.1 K/uL  TRIGLYCERIDES     Status: None    Collection Time    08/06/14  5:20 AM      Result Value Ref Range   Triglycerides 105  <150 mg/dL   Comment: Performed at [place]     Status: Abnormal   Collection Time    08/06/14  5:20 AM      Result Value Ref Range   Prealbumin 11.4 (*) 17.0 - 34.0 mg/dL   Comment: Performed at [facility]  CBC     Status: Abnormal   Collection Time    08/06/14  5:20 AM      Result Value Ref Range   WBC 19.7 (*) 4.0 - 10.5 K/uL   RBC 3.13 (*) 3.87 - 5.11 MIL/uL   Hemoglobin 8.3 (*) 12.0 - 15.0 g/dL   HCT 25.3 (*) 36.0 - 46.0 %   MCV 80.8  78.0 - 100.0 fL   MCH 26.5  26.0 - 34.0 pg   MCHC 32.8  30.0 - 36.0 g/dL   RDW 16.2 (*) 11.5 - 15.5 %   Platelets 183  150 - 400 K/uL  GLUCOSE, CAPILLARY     Status: Abnormal   Collection Time    08/06/14  6:06 AM      Result Value Ref Range   Glucose-Capillary 170 (*) 70 - 99 mg/dL  GLUCOSE, CAPILLARY     Status: Abnormal   Collection Time    08/06/14 11:39 AM      Result Value Ref Range   Glucose-Capillary 163 (*) 70 - 99 mg/dL  GLUCOSE, CAPILLARY     Status: Abnormal   Collection Time    08/07/14 12:09 AM      Result Value Ref Range   Glucose-Capillary 141 (*) 70 - 99 mg/dL   Comment 1 Notify RN    GLUCOSE, CAPILLARY     Status: Abnormal   Collection Time    08/07/14  5:40 AM      Result Value Ref Range   Glucose-Capillary 137 (*) 70 - 99 mg/dL   Comment 1 Notify RN      Imaging / Studies: No results found.  Medications / Allergies: per chart  Antibiotics: Anti-infectives   Start     Dose/Rate Route Frequency Ordered Stop   08/04/14 2200  piperacillin-tazobactam (ZOSYN) IVPB 3.375 g  Status:  Discontinued     3.375 g 12.5 mL/hr over 240 Minutes Intravenous Every 8 hours 08/04/14 2139 08/05/14 0900   07/31/14 1200  [MAR Hold]  piperacillin-tazobactam (ZOSYN) IVPB 3.375 g  Status:  Discontinued     (On MAR Hold since 08/04/14 2058)   3.375 g 12.5 mL/hr over 240 Minutes Intravenous Every 8 hours 07/31/14  1149 08/04/14 2139   07/27/14 2200  piperacillin-tazobactam (ZOSYN) IVPB 2.25 g  Status:  Discontinued     2.25 g 100 mL/hr over 30 Minutes Intravenous 3 times per day 07/27/14 2040 07/31/14 1148       Note: Portions of this report may have been transcribed using voice recognition software. Every effort was made to ensure accuracy; however, inadvertent computerized transcription errors may be present.   Any transcriptional errors that result from this process are unintentional.

## 2014-08-07 NOTE — Anesthesia Postprocedure Evaluation (Signed)
  Anesthesia Post-op Note  Patient: Victoria [name]  Procedure(s) Performed: Procedure(s) (LRB): EXPLORATORY LAPAROSCOPY, LAPAROSCOPIC PLACEMENT OF GASTROSTOMY TUBE, LAPAROSCOPIC LYSIS OF ADHESIONS (N/A)  Patient Location: PACU  Anesthesia Type: General  Level of Consciousness: awake and alert   Airway and Oxygen Therapy: Patient Spontanous Breathing  Post-op Pain: mild  Post-op Assessment: Post-op Vital signs reviewed, Patient's Cardiovascular Status Stable, Respiratory Function Stable, Patent Airway and No signs of Nausea or vomiting  Last Vitals:  Filed Vitals:   08/07/14 0816  BP:   Pulse:   Temp:   Resp: 27    Post-op Vital Signs: stable   Complications: No apparent anesthesia complications

## 2014-08-07 NOTE — Progress Notes (Signed)
Put fmla form on nurse's desk °

## 2014-08-07 NOTE — Care Management Note (Signed)
CARE MANAGEMENT NOTE 08/07/2014  Patient:  Victoria [name], Victoria [name]   Account Number:  [id number]  Date Initiated:  07/31/2014  Documentation initiated by:  [facility]  Subjective/Objective Assessment:   63 Y/O F ADMITTED W/FTT.YV:OPFYTW CA W/LIVER METS.READMIT 8/12-8/15/15.     Action/Plan:   FROM HOME.   Anticipated DC Date:  08/09/2014   Anticipated DC Plan:  HOME W HOSPICE CARE  In-house referral  NA      DC Planning Services  CM consult      Choice offered to / List presented to:             Status of service:  In process, will continue to follow Medicare Important Message given?   (If response is "NO", the following Medicare IM given date fields will be blank) Date Medicare IM given:   Medicare IM given by:   Date Additional Medicare IM given:   Additional Medicare IM given by:    Discharge Disposition:    Per UR Regulation:  Reviewed for med. necessity/level of care/duration of stay  If discussed at [place] of Stay Meetings, dates discussed:    Comments:  08/07/14 Marney Doctor RN,BSN,NCM Order written for CM for Home hospice referral.  MD note (Dr. Benay Spice) mentions home with [facility].  Pt states that she would like CM to come back tomorrow when her husband is here to talk to them both about the home hospice referral.  CM to speak with pt and husband tomorrow 08/08/14.  [id number]/TRRNHA Davis,RN,BSN,CCM: Patient to be moved from sdu to reg floor [id number]/plan at this time is home with hospice once medical stable/continues to have ng tube, strict i&o,and ileus.  07/31/14 KATHY MAHABIR RN,BSN NCM [phone] ACUTE CHOLECYSTITIS.SX FOLLOWING.NEPHROLOGY-AKI IMPROVING.

## 2014-08-07 NOTE — Progress Notes (Signed)
IP PROGRESS NOTE  Subjective: She appears comfortable this morning.  Objective: Vital signs in last 24 hours: Temp:  [97.5 F (36.4 C)-97.9 F (36.6 C)] 97.5 F (36.4 C) (09/08 1400) Pulse Rate:  [102-107] 107 (09/08 1400) Resp:  [22-30] 30 (09/08 1743) BP: (124-132)/(74-76) 132/76 mmHg (09/08 1400) SpO2:  [99 %-100 %] 100 % (09/08 1743) FiO2 (%):  [100 %] 100 % (09/08 1323)  Intake/Output from previous day: 09/07 0701 - 09/08 0700 In: 2158.5 [P.O.:125; I.V.:98.5; TPN:1875] Out: 625 [Urine:625] Intake/Output this shift: Total I/O In: -  Out: 250 [Urine:250] Lungs: Clear anteriorly Cardiac: Regular rate and rhythm Abdomen: distended-left upper quadrant gastrostomy tube Vascular: No leg edema  Lab Results:   Recent Labs  08/06/14 0520  WBC 19.7*  HGB 8.3*  HCT 25.3*  PLT 183   BMET  Recent Labs  08/05/14 0835 08/06/14 0520  NA 139 140  K 3.9 3.9  CL 98 100  CO2 28 28  GLUCOSE 178* 168*  BUN 30* 34*  CREATININE 1.52* 1.30*  CALCIUM 8.8 8.8    Studies/Results: No results found.    Assessment/Plan: 1. 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  Initiation of radiation and concurrent Xeloda 10/16/2013.  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  Cycle 1 adjuvant Xeloda 02/05/2014.  Cycle 2 adjuvant Xeloda 02/26/2014.  Cycle 3 adjuvant Xeloda 03/18/2014.  Cycle 4 adjuvant Xeloda 04/08/2014.  2. Indeterminate right lung nodule on CT 09/29/2013. 3. History of weight loss.  4. Left leg DVT 03/15/2014. On Lovenox. 5. Admission for ileostomy reversal 06/07/2014. 6. Hospitalization 07/11/2014 through 07/14/2014 with a small bowel obstruction. Patient felt to most likely have a narrow area at her small bowel anastomosis due to inflammation after surgery and not a true obstruction. 7. Elevated CEA 07/14/2014. 8. CT  chest/abdomen/pelvis 07/18/2014 with a new small right pleural effusion; stable 5 mm posterior right upper lobe lung nodule; gallstones noted within the gallbladder; marked gallbladder wall thickening measuring up to 11 mm in thickness at the fundus; abnormal low density noted in the adjacent inferior liver with small scattered satellite nodules in the adjacent liver. Findings within the liver new since the prior study. Small amount of perihepatic fluid adjacent to the right hepatic lobe and gallbladder. Extensive edema/stranding within the pelvis extending from the presacral space anteriorly. New area of sclerosis right pubic bone. Abdominal ultrasound 07/28/2014 concerning for acute cholecystitis 9. Acute renal failure/hyponatremia-seen by nephrology, renal failure improved with hydration. 10. Bowel obstruction requiring NG tube placement-status post a diagnostic laparoscopy 08/04/2012 confirming extensive carcinomatosis, gastrostomy tube placed 11. Pain secondary to surgery and carcinomatosis        LOS: 11 days    Victoria [name], ANP/GNP-BC 08/07/2014 5:49 PM  I again reviewed the operative findings with Victoria [name] and her husband. I explained the poor prognosis associated with carcinomatosis and a bowel obstruction. The chance of clinical improvement with chemotherapy is small and her performance status is not adequate to receive chemotherapy.  Recommendations:  1. Continue narcotic analgesics for pain, try Roxanol 2. [facility] hospice referral for home care 3. liquid diet, clamp gastrostomy tube as tolerated 4. ambulate

## 2014-08-07 NOTE — Progress Notes (Signed)
PARENTERAL NUTRITION CONSULT NOTE - Follow-up  Pharmacy Consult for TPN Indication: Prolonged ileus  Allergies  Allergen Reactions  . Ointment Base [Lanolin-Petrolatum] Rash    Specifically burn ointments.    Patient Measurements: Height: 5\' 5"  (165.1 cm) Weight: 160 lb 0.9 oz (72.6 kg) IBW/kg (Calculated) : 57 Adjusted Body Weight: 62 kg Usual Weight: 73 kg  Vital Signs: Temp: 97.9 F (36.6 C) (09/08 0542) Temp src: Oral (09/08 0542) BP: 124/74 mmHg (09/08 0542) Pulse Rate: 102 (09/08 0542)  Intake/Output from previous day: 09/07 0701 - 09/08 0700 In: 2158.5 [P.O.:125; I.V.:98.5; TPN:1875] Out: [place]:  Recent Labs  08/06/14 0520  WBC 19.7*  HGB 8.3*  HCT 25.3*  PLT 183     Recent Labs  08/05/14 0835 08/06/14 0520  NA 139 140  K 3.9 3.9  CL 98 100  CO2 28 28  GLUCOSE 178* 168*  BUN 30* 34*  CREATININE 1.52* 1.30*  CALCIUM 8.8 8.8  MG  --  2.1  PHOS  --  3.1  PROT  --  6.4  ALBUMIN  --  2.1*  AST  --  20  ALT  --  21  ALKPHOS  --  73  BILITOT  --  0.4  PREALBUMIN  --  11.4*  TRIG  --  105   Estimated Creatinine Clearance: 44.8 ml/min (by C-G formula based on Cr of 1.3).    Recent Labs  08/06/14 1139 08/07/14 0009 08/07/14 0540  GLUCAP 163* 141* 137*     Medications:  Infusions:  . 0.9 % NaCl with KCl 40 mEq / L 10 mL/hr (08/07/14 0923)  . Marland KitchenTPN (CLINIMIX-E) Adult 65 mL/hr at 08/06/14 1704   And  . fat emulsion 250 mL (08/06/14 1704)    Insulin Requirements last 24hr:  12 units; no Hx DM  Current Nutrition: full liquid started 9/8  IVF: NS w/ 40 mEq K @ 10 ml/hr  Central access: PICC TPN start date: 9/3  ASSESSMENT                                                                                                          HPI: 63 year old female with history of rectal adenocarcinoma, status post resection and diverting ileostomy in 12/2013 with reversal on 06/07/2014, Recent hospitalization for small bowel  obstruction 07/11/14 through 07/14/2014 who presented from cancer center with N/V, FTT.  Her hospital course was complicated with hyponatremia and acute renal failure, though both appear to be back to baseline at this time.  Ileus/partial SBO has prevented meaningful nutrition over the previous 2-3 weeks and here as well, and pharmacy was consulted to begin TPN.  Significant events:  9/3: noted to have some flatus.  Electrolyte, renal disturbances resolved 9/4: K low despite supplementation yesterday; likely due to hypoMg; phos corrected.  9/5: TPN increased to goal. TPN held while pt in surgery. 9/7: hospice referral 9/8: plan per surgery note for palliative G tube decompression and try clamping and open as needed for nausea and to continue TNA for now  but recommend weaning to off the next several days. Diet should be full liquids  Today 9/8:   Glucose: CBGs 137-170  Electrolytes - remain WNL. Corr Ca WNL.  Renal - improved, SCr 1.3  LFTs - WNL (9/7)  TGs - 170 (9/4), 105 (9/7)  Prealbumin - 12.9 (9/4), 11.4 (9/7)  T. Bili wnl (9/4)  NUTRITIONAL GOALS                                                                                             RD recs:  Kcal: 1700-1800  Protein: 80-90 gm  Fluid: 1.7L daily  Clinimix 5/20 at goal rate of 65 ml/hr with 20% lipids at 10 ml/hr; goal rate would provide 1852 kcal (100% est kcal needs), 78 gram protein (98% est protein needs)  PLAN                                                                                                                         At 1800 today in response to surgery note to recommend to wean TNA to off over the next several days:   Continue Clinimix 5/20 but decrease rate to 50 ml/hr  Continue 20% fat emulsion at 10 ml/hr.  TNA to contain standard multivitamins and trace elements.  Continue IVF @ 10 ml/hr.  Continue CBGs and moderate scale SSI but change to ACHS  TNA lab panels on Mondays & Thursdays.  F/u  daily.   Adrian Saran, PharmD, BCPS Pager [phone] 08/07/2014 10:08 AM

## 2014-08-07 NOTE — Progress Notes (Signed)
Patient ID: Victoria [name], female   DOB: 1951/08/25, 63 y.o.   MRN: [id number] [facility] PROGRESS NOTE  SHANTORIA ELLWOOD HYQ:[id number] DOB: 29-Apr-1951 DOA: 07/27/2014 PCP: Irven Shelling, MD  Brief narrative: 63 year old female with history of rectal adenocarcinoma, status post radiation and chemotherapy, status post resection and diverting ileostomy in 12/2013, ileostomy reversal on 06/07/2014, history of DVT on anticoagulation with Lovenox, recent hospitalization for small bowel obstruction 07/11/14 through 07/14/2014 who presented from cancer center with nausea and vomiting, failure to thrive. Abdominal US showed possible acute cholecystitis for which she was started on zosyn. In addition, she was found to have severe hyponatremia of 107 but it has improved with IV normal saline. Her creatinine was 4.62 on admission which was thought to be reflective of recent CT abdomen with contrast she had 07/18/2014.  Her hospital course is complicated with ongoing small bowel obstruction. She underwent diagnostic laparoscopy on 08/04/2014 with palliative G tube for decompression.   Assessment & Plan   Principal Problem:  Abdominal distention / Small bowel obstruction / Peritoneal carcinomatosis  Initial abd x ray showed gaseous distention and NG tube was placed for decompression. Patient continued to have quite a significant output through NG tube. She underwent diagnostic laparoscopy with lysis of adhesions, peritoneal biopsy of carcinomatosis and laparoscopi placement of PEG, 08/04/2014. Plan is for pt to be on full liquid diet and try to wean off TNA. Surgery will be following PRN. Appreciate oncology following and their recommendations. Most recent recommendation is for hospice care to follow at home on discharge  Pain management with PCA dilaudid.  Active Problems:  Acute cholecystitis / Leukocytosis Acute cholecystitis noted on abdominal US. Pt started on empiric zosyn on admission.  Zosyn stopped 08/04/2014. Pt has leukocytosis which was noted on 08/06/2014. No fevers and no evidence of infection. As mentioned she was on zosyn per surgery recommendations for acute cholecystitis and it was stopped by surgery 08/04/2014 after completing 9 days of treatment.  Rectal adenocarcinoma with liver metastases / Peritoneal carcinomatosis  Recent CT abdomen done 07/18/2014 with findings of new small right pleural effusion; stable 5 mm posterior right upper lobe lung nodule; gallstones noted within the gallbladder; marked gallbladder wall thickening measuring up to 11 mm in thickness at the fundus; abnormal low density noted in the adjacent inferior liver with small scattered satellite nodules in the adjacent liver. Findings within the liver new since the prior study. Small amount of perihepatic fluid adjacent to the right hepatic lobe and gallbladder. Extensive edema/stranding within the pelvis extending from the presacral space anteriorly. New area of sclerosis right pubic bone.  As mentioned, per recent oncology recommendation plan is for home hospice on discharge. Acute renal failure / Contrast induced nephropathy  Possibly contrast induced nephropathy  Creatinine improved with IV fluids. Creatinine trended down to 1.30.  Patient was seen by nephrology in consultation. Hypochloremic hyponatremia / metabolic alkalosis / hypokalemia  Likely dehydration, GI losses  Sodium improved with normal saline from 107 to normal level. Metabolic alkalosis resolved.  Potassium repleted through IV fluids. Potassium is WNL. Anemia of chronic disease  Likely due to history of malignancy. Hemoglobin is 8.3. No current indications for transfusion. 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  Severe protein calorie malnutrition  Nutrition consulted. Continue TNA for nutritional support.  DVT prophylaxis  On therapeutic anticoagulation with Lovenox.   Code Status: Full.  Family  Communication: plan of care discussed with the patient and her husband at  the bedside  Disposition Plan:home when stable   IV Access / Drains   Peripheral IV  PICC line  PEG tube placed 08/04/2014   Procedures and diagnostic studies:   US Abdomen Complete 07/28/2014 1. Gallstones with 1 stone measuring 1.7 cm lodged within the gallbladder neck. Wall is thickened to 5.2 mm. Multiple echogenic foci project along the gallbladder wall. This could reflect a air. Findings support acute cholecystitis in the proper clinical setting. 2. Hypoechoic liver lesions which are nonspecific. They do not appear to be cysts. Largest lies in the right lobe measuring 15 mm. Liver shows a diffusely coarsened increased echotexture consistent with hepatic steatosis. 3. No bile duct dilation.  Abdominal X ray 07/29/2014 - abdominal distention likely due to gaseous distention in stomach. Consider NG tube for decompression.  Dg Abd Portable 1v 07/31/2014 Interval decompression of the stomach with NG tube. Persistent gaseous distention of multiple loops of small bowel.  TNA 08/02/2014  Dg Abd 2 Views 08/04/2014 Enteric tube terminates in the distal gastric antrum/proximal duodenum. Dilated loops of small bowel with air-fluid levels in the left mid abdomen, suggesting partial small bowel obstruction or small bowel enteritis. No free air.  DIAGNOSTIC LAPAROSCOPY 08/04/2014; LAPAROSCOPIC LYSIS OF ADHESIONS; PERITONEAL BIOPSY OF CARCINOMATOSIS; LAPAROSCOPIC PLACEMENT OF GASTROSTOMY TUBE   Medical Consultants:   Oncology (Dr. Betsy Coder)  Nephrology (Dr. Roney Jaffe)  Surgery (Dr. Leighton Ruff and Dr. Michael Boston)   Other Consultants:   Nutrition   Anti-Infectives:   Zosyn 07/27/2014 --> 08/04/2014   Leisa Lenz, MD  [facility] Pager [phone]  If 7PM-7AM, please contact night-coverage [email or web address] Password [facility] 08/07/2014, 3:55 PM   LOS: 11 days    HPI/Subjective: No acute overnight  events.  Objective: Filed Vitals:   08/07/14 0542 08/07/14 0816 08/07/14 1323 08/07/14 1400  BP: 124/74   132/76  Pulse: 102   107  Temp: 97.9 F (36.6 C)   97.5 F (36.4 C)  TempSrc: Oral   Oral  Resp: 27 27 26 22   Height:      Weight:      SpO2: 100% 100% 100% 100%    Intake/Output Summary (Last 24 hours) at 08/07/14 1555 Last data filed at 08/07/14 1107  Gross per 24 hour  Intake   1325 ml  Output    525 ml  Net    800 ml    Exam:   General:  Pt is not in distress  Cardiovascular: Regular rate and rhythm, S1/S2, no murmurs  Respiratory: Clear to auscultation bilaterally, no wheezing, no crackles, no rhonchi  Abdomen: firm to palpation, PEG in place with dressing in place   Extremities: trace LE pitting edema, pulses DP and PT palpable bilaterally  Neuro: Grossly nonfocal  Data Reviewed: Basic Metabolic Panel:  Recent Labs Lab 08/01/14 0435 08/02/14 1005 08/03/14 0436 08/04/14 0445 08/05/14 0835 08/06/14 0520  NA 138 139 138 138 139 140  K 3.6* 3.4* 3.1* 3.3* 3.9 3.9  CL 92* 93* 90* 90* 98 100  CO2 27 31 32 34* 28 28  GLUCOSE 94 132* 145* 150* 178* 168*  BUN 19 14 19  24* 30* 34*  CREATININE 1.22* 1.28* 1.53* 1.50* 1.52* 1.30*  CALCIUM 9.1 9.3 9.1 9.5 8.8 8.8  MG  --   --  1.6 2.3  --  2.1  PHOS 1.7*  --  4.5  --   --  3.1   Liver Function Tests:  Recent Labs Lab 08/01/14 0435 08/03/14 0436 08/06/14 0520  AST  --  34 20  ALT  --  47* 21  ALKPHOS  --  87 73  BILITOT  --  0.4 0.4  PROT  --  6.9 6.4  ALBUMIN 2.4* 2.5* 2.1*   No results found for this basename: LIPASE, AMYLASE,  in the last 168 hours No results found for this basename: AMMONIA,  in the last 168 hours CBC:  Recent Labs Lab 08/01/14 0435 08/02/14 1005 08/03/14 0436 08/06/14 0520  WBC 9.1 8.5 9.4 19.7*  NEUTROABS  --   --  7.6 18.5*  HGB 9.8* 10.0* 9.6* 8.3*  HCT 29.3* 29.5* 28.5* 25.3*  MCV 79.0 77.6* 79.2 80.8  PLT 413* 344 363 183   Cardiac Enzymes: No results  found for this basename: CKTOTAL, CKMB, CKMBINDEX, TROPONINI,  in the last 168 hours BNP: No components found with this basename: POCBNP,  CBG:  Recent Labs Lab 08/06/14 0606 08/06/14 1139 08/07/14 0009 08/07/14 0540 08/07/14 1215  GLUCAP 170* 163* 141* 137* 141*    MRSA PCR SCREENING     Status: None   Collection Time    07/29/14  9:33 AM      Result Value Ref Range Status   MRSA by PCR NEGATIVE  NEGATIVE Final  MRSA PCR SCREENING     Status: None   Collection Time    08/04/14 10:30 AM      Result Value Ref Range Status   MRSA by PCR NEGATIVE  NEGATIVE Final     Scheduled Meds: . enoxaparin (LOVENOX) injection  100 mg Subcutaneous Q24H  . HYDROmorphone PCA 0.3 mg/mL   Intravenous 6 times per day  . insulin aspart  0-15 Units Subcutaneous TID WC   Continuous Infusions: . 0.9 % NaCl with KCl 40 mEq / L 10 mL/hr (08/07/14 0923)  . Marland KitchenTPN (CLINIMIX-E) Adult 65 mL/hr at 08/06/14 1704   And  . fat emulsion 250 mL (08/06/14 1704)  . Marland KitchenTPN (CLINIMIX-E) Adult     And  . fat emulsion

## 2014-08-08 ENCOUNTER — Encounter: Payer: Self-pay | Admitting: Oncology

## 2014-08-08 DIAGNOSIS — E43 Unspecified severe protein-calorie malnutrition: Secondary | ICD-10-CM

## 2014-08-08 LAB — GLUCOSE, CAPILLARY
GLUCOSE-CAPILLARY: 141 mg/dL — AB (ref 70–99)
GLUCOSE-CAPILLARY: 136 mg/dL — AB (ref 70–99)
Glucose-Capillary: 126 mg/dL — ABNORMAL HIGH (ref 70–99)

## 2014-08-08 LAB — BASIC METABOLIC PANEL
Anion gap: 11 (ref 5–15)
BUN: 38 mg/dL — ABNORMAL HIGH (ref 6–23)
CALCIUM: 9.1 mg/dL (ref 8.4–10.5)
CO2: 25 meq/L (ref 19–32)
Chloride: 93 mEq/L — ABNORMAL LOW (ref 96–112)
Creatinine, Ser: 1.27 mg/dL — ABNORMAL HIGH (ref 0.50–1.10)
GFR calc Af Amer: 51 mL/min — ABNORMAL LOW (ref >90)
GFR calc non Af Amer: 44 mL/min — ABNORMAL LOW (ref >90)
GLUCOSE: 122 mg/dL — AB (ref 70–99)
Potassium: 4.4 mEq/L (ref 3.7–5.3)
Sodium: 129 mEq/L — ABNORMAL LOW (ref 137–147)

## 2014-08-08 LAB — CBC
HEMATOCRIT: 22.8 % — AB (ref 36.0–46.0)
HEMOGLOBIN: 7.6 g/dL — AB (ref 12.0–15.0)
MCH: 26.1 pg (ref 26.0–34.0)
MCHC: 33.3 g/dL (ref 30.0–36.0)
MCV: 78.4 fL (ref 78.0–100.0)
Platelets: 239 10*3/uL (ref 150–400)
RBC: 2.91 MIL/uL — ABNORMAL LOW (ref 3.87–5.11)
RDW: 16 % — AB (ref 11.5–15.5)
WBC: 14.1 10*3/uL — AB (ref 4.0–10.5)

## 2014-08-08 MED ORDER — TRACE MINERALS CR-CU-F-FE-I-MN-MO-SE-ZN IV SOLN
INTRAVENOUS | Status: DC
  Administered 2014-08-08: 18:00:00 via INTRAVENOUS
  Filled 2014-08-08: qty 2000

## 2014-08-08 MED ORDER — FAT EMULSION 20 % IV EMUL
250.0000 mL | INTRAVENOUS | Status: DC
  Administered 2014-08-08: 250 mL via INTRAVENOUS
  Filled 2014-08-08: qty 250

## 2014-08-08 NOTE — Progress Notes (Signed)
Put fmla form in registration desk °

## 2014-08-08 NOTE — Progress Notes (Signed)
Notified by Deneen Harts of request for Hospice and [place] [facility] to follow after discharge; Probation officer spoke with staff RN Joelene Millin, and reviewed chart; per chart review, pt not medically ready for d/c; primary team still considering weaning TNA and adjusting medications; pt currently on PCA Dilaudid.  Attempted to speak with patient however multiple visitors in room; [place] will follow up with pt/husband in am.  Danton Sewer, RN MSN [facility] Liaison [phone]

## 2014-08-08 NOTE — Progress Notes (Signed)
PARENTERAL NUTRITION CONSULT NOTE - Follow-up  Pharmacy Consult for TPN Indication: Prolonged ileus  Allergies  Allergen Reactions  . Ointment Base [Lanolin-Petrolatum] Rash    Specifically burn ointments.    Patient Measurements: Height: 5\' 5"  (165.1 cm) Weight: 160 lb 0.9 oz (72.6 kg) IBW/kg (Calculated) : 57 Adjusted Body Weight: 62 kg Usual Weight: 73 kg  Vital Signs: Temp: 99.5 F (37.5 C) (09/09 0654) Temp src: Oral (09/09 0654) BP: 113/72 mmHg (09/09 0654) Pulse Rate: 102 (09/09 0654)  Intake/Output from previous day: 09/08 0701 - 09/09 0700 In: 1616.3 [TPN:1616.3] Out: 1000 [Urine:950; Drains:50]  Labs:  Recent Labs  08/06/14 0520 08/08/14 0343  WBC 19.7* 14.1*  HGB 8.3* 7.6*  HCT 25.3* 22.8*  PLT 183 239     Recent Labs  08/06/14 0520 08/08/14 0343  NA 140 129*  K 3.9 4.4  CL 100 93*  CO2 28 25  GLUCOSE 168* 122*  BUN 34* 38*  CREATININE 1.30* 1.27*  CALCIUM 8.8 9.1  MG 2.1  --   PHOS 3.1  --   PROT 6.4  --   ALBUMIN 2.1*  --   AST 20  --   ALT 21  --   ALKPHOS 73  --   BILITOT 0.4  --   PREALBUMIN 11.4*  --   TRIG 105  --    Estimated Creatinine Clearance: 45.8 ml/min (by C-G formula based on Cr of 1.27).    Recent Labs  08/07/14 1215 08/07/14 1757 08/08/14 0739  GLUCAP 141* 139* 136*     Medications:  Infusions:  . 0.9 % NaCl with KCl 40 mEq / L 10 mL/hr (08/07/14 0923)  . Marland KitchenTPN (CLINIMIX-E) Adult 50 mL/hr at 08/07/14 1745   And  . fat emulsion 250 mL (08/07/14 1745)    Insulin Requirements last 24hr:  4 units; no Hx DM  Current Nutrition: full liquid started 9/8, minimal PO intake, poor appetite  IVF: NS w/ 40 mEq K @ 10 ml/hr  Central access: PICC TPN start date: 9/3  ASSESSMENT                                                                                                          HPI: 63 year old female with history of rectal adenocarcinoma, status post resection and diverting ileostomy in 12/2013 with  reversal on 06/07/2014, Recent hospitalization for small bowel obstruction 07/11/14 through 07/14/2014 who presented from cancer center with N/V, FTT.  Her hospital course was complicated with hyponatremia and acute renal failure, though both appear to be back to baseline at this time.  Ileus/partial SBO has prevented meaningful nutrition over the previous 2-3 weeks and here as well, and pharmacy was consulted to begin TPN.  Significant events:  9/3: noted to have some flatus.  Electrolyte, renal disturbances resolved 9/4: K low despite supplementation yesterday; likely due to hypoMg; phos corrected.  9/5: TPN increased to goal. TPN held while pt in surgery. 9/7: hospice referral 9/8: plan per surgery note for palliative G tube decompression and try clamping and open  as needed for nausea and to continue TNA for now but recommend weaning to off the next several days. Diet should be full liquids  Today 9/9:   Glucose: CBGs < 150  Electrolytes - Na now low at 129 (can not adjust Na in premixed TNA), other lytes remain WNL. Corr Ca WNL.  Renal - essentially stable from yesterday, SCr 1.27  LFTs - WNL (9/7)  TGs - 170 (9/4), 105 (9/7)  Prealbumin - 12.9 (9/4), 11.4 (9/7)  T. Bili wnl (9/4)  NUTRITIONAL GOALS                                                                                             RD recs:  Kcal: 1700-1800  Protein: 80-90 gm  Fluid: 1.7L daily  Clinimix 5/20 at goal rate of 65 ml/hr with 20% lipids at 10 ml/hr; goal rate would provide 1852 kcal (100% est kcal needs), 78 gram protein (98% est protein needs)  PLAN                                                                                                                         At 1800 today in response to surgery note written 9/8 to recommend to wean TNA to off over the next several days:   Continue Clinimix 5/20 at 50 ml/hr until PO intake improves  Continue 20% fat emulsion at 10 ml/hr.  TNA to contain standard  multivitamins and trace elements.  Continue IVF @ 10 ml/hr.  Continue CBGs and moderate scale SSI AC + HS  TNA lab panels on Mondays & Thursdays.  F/u daily.  Thank you for the consult.  Currie Paris, PharmD, BCPS Pager: [phone] Pharmacy: [phone] 08/08/2014 11:07 AM

## 2014-08-08 NOTE — Progress Notes (Addendum)
Progress Note   Victoria [name] MKL:[id number] DOB: 1951/09/10 DOA: 07/27/2014 PCP: Irven Shelling, MD  Brief narrative: 63 year old female with history of rectal adenocarcinoma, status post radiation and chemotherapy, status post resection and diverting ileostomy in 12/2013, ileostomy reversal on 06/07/2014, history of DVT on anticoagulation with Lovenox, recent hospitalization for small bowel obstruction 07/11/14 - 07/14/2014 who presented from cancer center with nausea and vomiting, failure to thrive. Abdominal US showed possible acute cholecystitis for which she was started on zosyn. In addition, she was found to have severe hyponatremia of 107 but it has improved with IV normal saline. Her creatinine was 4.62 on admission which was thought to be reflective of recent CT abdomen with contrast she had 07/18/2014.  Her hospital course is complicated by ongoing small bowel obstruction. She underwent diagnostic laparoscopy on 08/04/2014 with palliative G tube for decompression.   Assessment & Plan   Principal Problem:  Abdominal distention / Small bowel obstruction / Peritoneal carcinomatosis   Initial abd x ray showed gaseous distention and NG tube was placed for decompression. Patient continued to have quite a significant output through NG tube. She underwent diagnostic laparoscopy with lysis of adhesions, peritoneal biopsy of carcinomatosis and laparoscopic placement of PEG 08/04/2014 which may be clamped to see if patient can tolerate this with un-clamping PRN nausea. Still having a fair amount of drainage, so will hold off clamping for now. Reassess in a.m.  Plan is for pt to be on full liquid diet and try to wean off TNA. Surgery following PRN.  Oncologist following. Most recent recommendation is for hospice care to follow at home on discharge   Pain management with PCA dilaudid.  Active Problems:  Acute cholecystitis / Leukocytosis  Acute cholecystitis noted on abdominal US. Pt  started on empiric zosyn on admission. Zosyn stopped 08/04/2014.  Pt has leukocytosis which was noted on 08/06/2014. No fevers and no evidence of infection. As mentioned she was on zosyn per surgery recommendations for acute cholecystitis and it was stopped by surgery 08/04/2014 after completing 9 days of treatment.   WBC trending down.  Rectal adenocarcinoma with liver metastases / Peritoneal carcinomatosis   Hospice recommended.  Acute renal failure / Contrast induced nephropathy   Acute renal failure felt to be secondary to contrast induced nephropathy. Evaluated by nephrologist on admission.  Renal function gradually improving.   Hypochloremic hyponatremia / metabolic alkalosis / hypokalemia   Likely secondary to dehydration, GI losses.   Most recent be met shows ongoing mild hyponatremia/hypochloremia.  Metabolic alkalosis and hypokalemia have resolved.  Anemia of chronic disease   Likely due to history of malignancy. Hemoglobin is 8.3. No current indications for transfusion.  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   Severe protein calorie malnutrition   Nutrition consulted. Continue TNA for nutritional support.   DVT prophylaxis   On therapeutic anticoagulation with Lovenox.  Code Status: Full.  Family Communication: No family at the bedside. Disposition Plan:  Home with hospice when stable.   IV Access / Drains   Peripheral IV  PICC line  PEG tube placed 08/04/2014   Procedures and diagnostic studies:   US Abdomen Complete 07/28/2014 1. Gallstones with 1 stone measuring 1.7 cm lodged within the gallbladder neck. Wall is thickened to 5.2 mm. Multiple echogenic foci project along the gallbladder wall. This could reflect a air. Findings support acute cholecystitis in the proper clinical setting. 2. Hypoechoic liver lesions which are nonspecific.  They do not appear to be cysts. Largest lies in the right lobe measuring 15 mm. Liver  shows a diffusely coarsened increased echotexture consistent with hepatic steatosis. 3. No bile duct dilation.  Abdominal X ray 07/29/2014 - abdominal distention likely due to gaseous distention in stomach. Consider NG tube for decompression.  Dg Abd Portable 1v 07/31/2014 Interval decompression of the stomach with NG tube. Persistent gaseous distention of multiple loops of small bowel.  TNA 08/02/2014  Dg Abd 2 Views 08/04/2014 Enteric tube terminates in the distal gastric antrum/proximal duodenum. Dilated loops of small bowel with air-fluid levels in the left mid abdomen, suggesting partial small bowel obstruction or small bowel enteritis. No free air.  DIAGNOSTIC LAPAROSCOPY 08/04/2014; LAPAROSCOPIC LYSIS OF ADHESIONS; PERITONEAL BIOPSY OF CARCINOMATOSIS; LAPAROSCOPIC PLACEMENT OF GASTROSTOMY TUBE   Medical Consultants:   Oncology (Dr. Betsy Coder)  Nephrology (Dr. Roney Jaffe)  Surgery (Dr. Leighton Ruff and Dr. Michael Boston)   Other Consultants:   Nutrition   Anti-Infectives:   Zosyn 07/27/2014 --> 08/04/2014  HPI/Subjective: No acute overnight events.  Objective: Filed Vitals:   08/08/14 0028 08/08/14 0334 08/08/14 0654 08/08/14 0819  BP:   113/72   Pulse:   102   Temp:   99.5 F (37.5 C)   TempSrc:   Oral   Resp: _0 36  Height:      Weight:      SpO2: 100% 99% 100% 100%    Intake/Output Summary (Last 24 hours) at 08/08/14 1424 Last data filed at 08/08/14 1345  Gross per 24 hour  Intake 1856.25 ml  Output   2475 ml  Net -618.75 ml    Exam:  Gen:  NAD, slow to respond at times Cardiovascular:  RRR, No M/R/G Respiratory: Lungs CTAB Gastrointestinal: Abdomen soft, NT/ND with normal active bowel sounds. Extremities: No C/E/C  Data Reviewed: Basic Metabolic Panel:  Recent Labs Lab 08/03/14 0436 08/04/14 0445 08/05/14 0835 08/06/14 0520 08/08/14 0343  NA 138 138 139 140 129*  K 3.1* 3.3* 3.9 3.9 4.4  CL 90* 90* 98 100 93*  CO2 32 34* _1 GLUCOSE  145* 150* 178* 168* 122*  BUN 19 24* 30* 34* 38*  CREATININE 1.53* 1.50* 1.52* 1.30* 1.27*  CALCIUM 9.1 9.5 8.8 8.8 9.1  MG 1.6 2.3  --  2.1  --   PHOS 4.5  --   --  3.1  --    GFR Estimated Creatinine Clearance: 45.8 ml/min (by C-G formula based on Cr of 1.27). Liver Function Tests:  Recent Labs Lab 08/03/14 0436 08/06/14 0520  AST 34 20  ALT 47* 21  ALKPHOS 87 73  BILITOT 0.4 0.4  PROT 6.9 6.4  ALBUMIN 2.5* 2.1*    CBC:  Recent Labs Lab 08/02/14 1005 08/03/14 0436 08/06/14 0520 08/08/14 0343  WBC 8.5 9.4 19.7* 14.1*  NEUTROABS  --  7.6 18.5*  --   HGB 10.0* 9.6* 8.3* 7.6*  HCT 29.5* 28.5* 25.3* 22.8*  MCV 77.6* 79.2 80.8 78.4  PLT 344 363 183 239   CBG:  Recent Labs Lab 08/07/14 0540 08/07/14 1215 08/07/14 1757 08/08/14 0739 08/08/14 1158  GLUCAP 137* 141* 139* 136* 126*   Lipid Profile  Recent Labs  08/06/14 0520  TRIG 105   Microbiology Recent Results (from the past 240 hour(s))  MRSA PCR SCREENING     Status: None   Collection Time    08/04/14 10:30 AM      Result Value Ref Range  Status   MRSA by PCR NEGATIVE  NEGATIVE Final   Comment:            The GeneXpert MRSA Assay (FDA     approved for NASAL specimens     only), is one component of a     comprehensive MRSA colonization     surveillance program. It is not     intended to diagnose MRSA     infection nor to guide or     monitor treatment for     MRSA infections.     Scheduled Meds: . enoxaparin (LOVENOX) injection  100 mg Subcutaneous Q24H  . HYDROmorphone PCA 0.3 mg/mL   Intravenous 6 times per day  . insulin aspart  0-15 Units Subcutaneous TID WC   Continuous Infusions: . 0.9 % NaCl with KCl 40 mEq / L 10 mL/hr (08/07/14 0923)  . Marland KitchenTPN (CLINIMIX-E) Adult 50 mL/hr at 08/07/14 1745   And  . fat emulsion 250 mL (08/07/14 1745)  . Marland KitchenTPN (CLINIMIX-E) Adult     And  . fat emulsion      Time spent: 25 minutes.   LOS: 12 days   [facility] Pager  [phone]. If unable to reach me by pager, please call my cell phone at [phone].  *Please refer to [email or web address], password [email or web address] to get updated schedule on who will round on this patient, as hospitalists switch teams weekly. If 7PM-7AM, please contact night-coverage at [email or web address], password [email or web address] for any overnight needs.  08/08/2014, 2:34 PM

## 2014-08-08 NOTE — Progress Notes (Signed)
[place] for Lovenox Indication: History of DVT  Allergies  Allergen Reactions  . Ointment Base [Lanolin-Petrolatum] Rash    Specifically burn ointments.    Patient Measurements: Height: 5\' 5"  (165.1 cm) Weight: 160 lb 0.9 oz (72.6 kg) IBW/kg (Calculated) : 57  Vital Signs: Temp: 99.5 F (37.5 C) (09/09 0654) Temp src: Oral (09/09 0654) BP: 113/72 mmHg (09/09 0654) Pulse Rate: 102 (09/09 0654)  Labs:  Recent Labs  08/06/14 0520 08/08/14 0343  HGB 8.3* 7.6*  HCT 25.3* 22.8*  PLT 183 239  CREATININE 1.30* 1.27*    Estimated Creatinine Clearance: 45.8 ml/min (by C-G formula based on Cr of 1.27).   Medical History: Past Medical History  Diagnosis Date  . Medical history non-contributory   . HDL deficiency   . Allergy     seasonal allergic rhinitis  . History of radiation therapy 10/16/13-11/27/13    rectal 50.4Gy  . Peripheral vascular disease 4/15    DVT LEFT LEG  . Cancer 09/19/13    rectum    Assessment: Pt with h/o rectal cancer and now with liver mets was admitted from [facility] on 8/28 for FTT and dehydration. Pt was on Lovenox 100mg  SQ Q24h PTA for L leg DVT diagnosed in 03/15/14 with last dose 8/27@1730 .  Noted to be in AKI at admission and lovenox dosed on anti-Xa levels.  Renal function resolved to baseline on 9/3.  Also, patient has prolonged ileus/pSBO on TPN, s/p lap LOA and placement of PEG on 9/5.  Pharmacy is consulted to continue enoxaparin while inpatient   Hgb decreased to 7.6  Platelets ok  SCr improved to 1.27, now stablized; CrCl 45 ml/min  Significant Events: 9/2: SCr improved to approximately baseline 9/4: Lovenox held in anticipation of surgery on 9/5 9/5: surgery performed: diagnostic laproscopy, laproscopic lysis of adhesions, peritoneal biopsy of carcinomatosis, laproscopic placement of gastrostomy tube 9/6: Lovenox resumed  Goal of Therapy:  Appropriate anticoagulation for patient with hx  of DVT Monitor PLT with anticoagulation protocol: Yes   Plan:   Continue Lovenox 100mg  subq Q24H  Follow renal function, CBC   Thank you for the consult.  Currie Paris, PharmD, BCPS Pager: [phone] Pharmacy: [phone] 08/08/2014 11:15 AM

## 2014-08-09 ENCOUNTER — Encounter (HOSPITAL_COMMUNITY): Payer: Self-pay | Admitting: Surgery

## 2014-08-09 LAB — GLUCOSE, CAPILLARY
GLUCOSE-CAPILLARY: 141 mg/dL — AB (ref 70–99)
Glucose-Capillary: 130 mg/dL — ABNORMAL HIGH (ref 70–99)
Glucose-Capillary: 132 mg/dL — ABNORMAL HIGH (ref 70–99)

## 2014-08-09 LAB — COMPREHENSIVE METABOLIC PANEL
ALT: 22 U/L (ref 0–35)
AST: 24 U/L (ref 0–37)
Albumin: 1.8 g/dL — ABNORMAL LOW (ref 3.5–5.2)
Alkaline Phosphatase: 113 U/L (ref 39–117)
Anion gap: 10 (ref 5–15)
BUN: 30 mg/dL — ABNORMAL HIGH (ref 6–23)
CALCIUM: 9.2 mg/dL (ref 8.4–10.5)
CO2: 28 meq/L (ref 19–32)
CREATININE: 1.07 mg/dL (ref 0.50–1.10)
Chloride: 97 mEq/L (ref 96–112)
GFR calc Af Amer: 63 mL/min — ABNORMAL LOW (ref >90)
GFR calc non Af Amer: 54 mL/min — ABNORMAL LOW (ref >90)
Glucose, Bld: 137 mg/dL — ABNORMAL HIGH (ref 70–99)
Potassium: 4.2 mEq/L (ref 3.7–5.3)
Sodium: 135 mEq/L — ABNORMAL LOW (ref 137–147)
TOTAL PROTEIN: 6.4 g/dL (ref 6.0–8.3)
Total Bilirubin: 0.7 mg/dL (ref 0.3–1.2)

## 2014-08-09 LAB — MAGNESIUM: MAGNESIUM: 1.9 mg/dL (ref 1.5–2.5)

## 2014-08-09 LAB — PHOSPHORUS: PHOSPHORUS: 4 mg/dL (ref 2.3–4.6)

## 2014-08-09 MED ORDER — FAT EMULSION 20 % IV EMUL
250.0000 mL | INTRAVENOUS | Status: DC
  Filled 2014-08-09: qty 250

## 2014-08-09 MED ORDER — TRACE MINERALS CR-CU-F-FE-I-MN-MO-SE-ZN IV SOLN
INTRAVENOUS | Status: DC
  Filled 2014-08-09: qty 2000

## 2014-08-09 MED ORDER — HYDROMORPHONE HCL PF 1 MG/ML IJ SOLN
0.5000 mg | INTRAMUSCULAR | Status: DC | PRN
  Administered 2014-08-09 – 2014-08-10 (×6): 2 mg via INTRAVENOUS
  Filled 2014-08-09 (×6): qty 2

## 2014-08-09 MED ORDER — FENTANYL 25 MCG/HR TD PT72
25.0000 ug | MEDICATED_PATCH | TRANSDERMAL | Status: DC
  Administered 2014-08-09: 25 ug via TRANSDERMAL
  Filled 2014-08-09: qty 1

## 2014-08-09 NOTE — Progress Notes (Signed)
PARENTERAL NUTRITION CONSULT NOTE - Follow-up  Pharmacy Consult for TPN Indication: Prolonged ileus  Allergies  Allergen Reactions  . Ointment Base [Lanolin-Petrolatum] Rash    Specifically burn ointments.   Patient Measurements: Height: 5\' 5"  (165.1 cm) Weight: 160 lb 0.9 oz (72.6 kg) IBW/kg (Calculated) : 57 Adjusted Body Weight: 62 kg Usual Weight: 73 kg  Vital Signs: Temp: 97.9 F (36.6 C) (09/10 0526) Temp src: Oral (09/10 0526) BP: 116/67 mmHg (09/10 0526) Pulse Rate: 101 (09/10 0526)  Intake/Output from previous day: 09/09 0701 - 09/10 0700 In: 9335 [P.O.:240; I.V.:692; RWE:3154] Out: 2625 [Urine:1625; Drains:1000]  Labs:  Recent Labs  08/08/14 0343  WBC 14.1*  HGB 7.6*  HCT 22.8*  PLT 239    Recent Labs  08/08/14 0343 08/09/14 0645  NA 129* 135*  K 4.4 4.2  CL 93* 97  CO2 25 28  GLUCOSE 122* 137*  BUN 38* 30*  CREATININE 1.27* 1.07  CALCIUM 9.1 9.2  MG  --  1.9  PHOS  --  4.0  PROT  --  6.4  ALBUMIN  --  1.8*  AST  --  24  ALT  --  22  ALKPHOS  --  113  BILITOT  --  0.7   Estimated Creatinine Clearance: 54.4 ml/min (by C-G formula based on Cr of 1.07).    Recent Labs  08/08/14 0739 08/08/14 1158 08/09/14 0739  GLUCAP 136* 126* 141*   Insulin Requirements last 24hr:  4 units; no Hx DM  Current Nutrition: full liquid started 9/8, minimal PO intake, poor appetite  IVF: NS w/ 40 mEq K @ 10 ml/hr  Central access: PICC TPN start date: 9/3  ASSESSMENT                                                                                                          HPI: 63 year old female with history of rectal adenocarcinoma, status post resection and diverting ileostomy in 12/2013 with reversal on 06/07/2014, Recent hospitalization for small bowel obstruction 07/11/14 through 07/14/2014 who presented from cancer center with N/V, FTT.  Her hospital course was complicated with hyponatremia and acute renal failure, though both appear to be back to  baseline at this time.  Ileus/partial SBO has prevented meaningful nutrition over the previous 2-3 weeks and here as well, and pharmacy was consulted to begin TPN.  Significant events:  9/3: noted to have some flatus.  Electrolyte, renal disturbances resolved 9/4: K low despite supplementation yesterday; likely due to hypoMg; phos corrected.  9/5: TPN increased to goal. TPN held while pt in surgery. 9/7: hospice referral 9/8: plan per surgery note for palliative G tube decompression and try clamping and open as needed for nausea and to continue TNA for now but recommend weaning to off the next several days. Diet should be full liquids 9/10: wean and d/c TNA today  Today 9/9:   Glucose: CBGs < 150  Electrolytes - Na improved at 135 (can not adjust Na in premixed TNA), other lytes remain WNL. Corr Ca WNL.  Renal -  SCr improved 1.07  LFTs - WNL (9/7)  TGs - 170 (9/4), 105 (9/7)  Prealbumin - 12.9 (9/4), 11.4 (9/7)  T. Bili wnl (9/4)  NUTRITIONAL GOALS                                                                                             RD recs:  Kcal: 1700-1800  Protein: 80-90 gm  Fluid: 1.7L daily  Clinimix 5/20 at goal rate of 65 ml/hr with 20% lipids at 10 ml/hr; goal rate would provide 1852 kcal (100% est kcal needs), 78 gram protein (98% est protein needs)  PLAN                                                                                                                          Reduce Clinimix 5/20 to 25 ml/hr now, discontinue at 12N  Reduce 20% fat emulsion to 5 ml/hr, discontinue at 12N  TNA to contain standard multivitamins and trace elements.  Continue IVF @ 10 ml/hr.  CBG at 12N  Thank you for the consult.  Minda Ditto PharmD Pager [phone] 08/09/2014, 8:42 AM

## 2014-08-09 NOTE — Evaluation (Signed)
Physical Therapy Evaluation Patient Details Name: Victoria [name] MRN: [id number] DOB: 1951/01/24 Today's Date: 08/09/2014   History of Present Illness  64 yo female s/p lap G tube, biopsy, lysis of adhesions 9/5. Being followed by home hospice.   Clinical Impression  On eval, pt required Min assist (+2 for equipment/lines) for mobility-able to perform stand pivot from bed to recliner. Pt did not feel able to ambulate on today. Per pt, plan is for home with hospice following. Will continue to assess equipment needs.     Follow Up Recommendations Home health PT;Supervision/Assistance - 24 hour (likely home under hospice care)    Equipment Recommendations  Rolling walker with 5" wheels;3in1 (PT);Hospital bed    Recommendations for Other Services OT consult     Precautions / Restrictions Precautions Precautions: Fall Precaution Comments: abd surgery; multiple lines/leads Restrictions Weight Bearing Restrictions: No      Mobility  Bed Mobility Overal bed mobility: Needs Assistance Bed Mobility: Supine to Sit;Sit to Supine     Supine to sit: Min guard;HOB elevated     General bed mobility comments: Increased time. reliance on bedrail.   Transfers Overall transfer level: Needs assistance Equipment used: Rolling walker (2 wheeled) Transfers: Sit to/from Stand Sit to Stand: Min assist         General transfer comment: assist to rise, stabilize, control descent. VCs safety, technique, handplacement  Ambulation/Gait Ambulation/Gait assistance: Min assist   Assistive device: Rolling walker (2 wheeled) Gait Pattern/deviations: Step-to pattern     General Gait Details: several lateral steps along EOB with RW then stand pivot to recliner. Increased time. Fatigues easily.   Stairs            Wheelchair Mobility    Modified Rankin (Stroke Patients Only)       Balance Overall balance assessment: Needs assistance         Standing balance support:  Bilateral upper extremity supported;During functional activity Standing balance-Leahy Scale: Poor                               Pertinent Vitals/Pain Pain Assessment: 0-10 Pain Score: 5  Pain Location: abdomen    Home Living Family/patient expects to be discharged to:: Private residence Living Arrangements: Spouse/significant other Available Help at Discharge: Family;Available 24 hours/day Type of Home: House Home Access: Stairs to enter   [employer] of Steps: 3 Home Layout: Two level;1/2 bath on main level;Bed/bath upstairs Home Equipment: None      Prior Function Level of Independence: Independent               Hand Dominance        Extremity/Trunk Assessment   Upper Extremity Assessment: Generalized weakness           Lower Extremity Assessment: Generalized weakness      Cervical / Trunk Assessment: Normal  Communication   Communication: No difficulties  Cognition Arousal/Alertness: Awake/alert Behavior During Therapy: WFL for tasks assessed/performed Overall Cognitive Status: Within Functional Limits for tasks assessed                      General Comments      Exercises        Assessment/Plan    PT Assessment Patient needs continued PT services  PT Diagnosis Difficulty walking;Generalized weakness;Acute pain   PT Problem List Decreased strength;Decreased activity tolerance;Decreased balance;Decreased mobility;Decreased knowledge of use of DME;Pain  PT Treatment Interventions DME  instruction;Gait training;Functional mobility training;Therapeutic activities;Patient/family education;Balance training;Therapeutic exercise   PT Goals (Current goals can be found in the Care Plan section) Acute Rehab PT Goals Patient Stated Goal: home soon PT Goal Formulation: With patient Time For Goal Achievement: 08/23/14 Potential to Achieve Goals: Fair    Frequency Min 3X/week   Barriers to discharge         Co-evaluation               End of Session   Activity Tolerance: Patient limited by fatigue;Patient limited by pain Patient left: in chair;with call bell/phone within reach;with family/visitor present           Time: 4765-4650 PT Time Calculation (min): 28 min   Charges:   PT Evaluation $Initial PT Evaluation Tier I: 1 Procedure PT Treatments $Therapeutic Activity: 23-37 mins   PT G Codes:          Weston Anna, MPT Pager: [phone]

## 2014-08-09 NOTE — Progress Notes (Signed)
Progress Note   Victoria [name]:[id number] DOB: 25-Mar-1951 DOA: 07/27/2014 PCP: Irven Shelling, MD  Brief narrative: 63 year old female with history of rectal adenocarcinoma, status post radiation and chemotherapy, status post resection and diverting ileostomy in 12/2013, ileostomy reversal on 06/07/2014, history of DVT on anticoagulation with Lovenox, recent hospitalization for small bowel obstruction 07/11/14 - 07/14/2014 who presented from cancer center with nausea and vomiting, failure to thrive. Abdominal US showed possible acute cholecystitis for which she was started on zosyn. In addition, she was found to have severe hyponatremia of 107 but it has improved with IV normal saline. [name] creatinine was 4.62 on admission which was thought to be reflective of recent CT abdomen with contrast she had 07/18/2014.  [name] hospital course is complicated by ongoing small bowel obstruction. She underwent diagnostic laparoscopy on 08/04/2014 with palliative G tube for decompression.   Assessment & Plan   Principal Problem:  Abdominal distention / Small bowel obstruction / Peritoneal carcinomatosis   Initial abd x ray showed gaseous distention and NG tube was placed for decompression. Patient continued to have quite a significant output through NG tube. She underwent diagnostic laparoscopy with lysis of adhesions, peritoneal biopsy of carcinomatosis and laparoscopic placement of PEG 08/04/2014 which may be clamped to see if patient can tolerate this with un-clamping PRN nausea. Still having a fair amount of drainage, PEG now placed to gravity drainage.  Full liquid diet ordered, TNA being weaned.  Oncologist following. Most recent recommendation is for hospice care to follow at home on discharge   Pain management with PCA dilaudid discontinued 08/08/14. Started on a fentanyl patch with when necessary Dilaudid-HP for breakthrough pain. Also has oral morphine as needed.  Active Problems:  Acute  cholecystitis / Leukocytosis  Acute cholecystitis noted on abdominal US. Pt started on empiric zosyn on admission. Zosyn stopped 08/04/2014.  Pt has leukocytosis which was noted on 08/06/2014. No fevers and no evidence of infection. As mentioned she was on zosyn per surgery recommendations for acute cholecystitis and it was stopped by surgery 08/04/2014 after completing 9 days of treatment.   WBC trending down.  Rectal adenocarcinoma with liver metastases / Peritoneal carcinomatosis   Hospice recommended.  Acute renal failure / Contrast induced nephropathy   Acute renal failure felt to be secondary to contrast induced nephropathy. Evaluated by nephrologist on admission.  Renal function gradually improving.   Hypochloremic hyponatremia / metabolic alkalosis / hypokalemia   Likely secondary to dehydration, GI losses.   Most recent BMET shows improved mild hyponatremia/and resolution of hypochloremia.  Metabolic alkalosis and hypokalemia have resolved.  Anemia of chronic disease   Likely due to history of malignancy. Hemoglobin is 8.3. No current indications for transfusion.  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   Severe protein calorie malnutrition   Nutrition consulted. Continue TNA for nutritional support.   DVT prophylaxis   On therapeutic anticoagulation with Lovenox.  Code Status: Full.  Family Communication: Mother and brother at the bedside. Disposition Plan:  Home with hospice when stable.   IV Access / Drains   Peripheral IV  PICC line  PEG tube placed 08/04/2014   Procedures and diagnostic studies:   US Abdomen Complete 07/28/2014 1. Gallstones with 1 stone measuring 1.7 cm lodged within the gallbladder neck. Wall is thickened to 5.2 mm. Multiple echogenic foci project along the gallbladder wall. This could reflect a air. Findings support acute cholecystitis in the proper clinical setting.  2. Hypoechoic liver lesions which  are nonspecific. They do not appear to be cysts. Largest lies in the right lobe measuring 15 mm. Liver shows a diffusely coarsened increased echotexture consistent with hepatic steatosis. 3. No bile duct dilation.  Abdominal X ray 07/29/2014 - abdominal distention likely due to gaseous distention in stomach. Consider NG tube for decompression.  Dg Abd Portable 1v 07/31/2014 Interval decompression of the stomach with NG tube. Persistent gaseous distention of multiple loops of small bowel.  TNA 08/02/2014  Dg Abd 2 Views 08/04/2014 Enteric tube terminates in the distal gastric antrum/proximal duodenum. Dilated loops of small bowel with air-fluid levels in the left mid abdomen, suggesting partial small bowel obstruction or small bowel enteritis. No free air.  DIAGNOSTIC LAPAROSCOPY 08/04/2014; LAPAROSCOPIC LYSIS OF ADHESIONS; PERITONEAL BIOPSY OF CARCINOMATOSIS; LAPAROSCOPIC PLACEMENT OF GASTROSTOMY TUBE   Medical Consultants:   Oncology (Dr. Betsy Coder)  Nephrology (Dr. Roney Jaffe)  Surgery (Dr. Leighton Ruff and Dr. Michael Boston)   Other Consultants:   Nutrition   Anti-Infectives:   Zosyn 07/27/2014 --> 08/04/2014  HPI/Subjective: No acute overnight events. No new complaints. Rates [name] pain as a 5/10. No nausea/vomiting with change in PEG tube to gravity drainage and taking in some full liquids.  Objective: Filed Vitals:   08/08/14 2105 08/08/14 2133 08/09/14 0526 08/09/14 0741  BP:  113/65 116/67   Pulse:  107 101   Temp:  98.3 F (36.8 C) 97.9 F (36.6 C)   TempSrc:  Oral Oral   Resp: 33 32 33 20  Height:      Weight:      SpO2: 97% 98% 98% 95%    Intake/Output Summary (Last 24 hours) at 08/09/14 0813 Last data filed at 08/09/14 0526  Gross per 24 hour  Intake   9335 ml  Output   1775 ml  Net   7560 ml    Exam:  Gen:  NAD, slow to respond at times Cardiovascular:  RRR, No M/R/G Respiratory: Lungs CTAB Gastrointestinal: Abdomen soft, NT/ND with normal active bowel  sounds. Extremities: No C/E/C  Data Reviewed: Basic Metabolic Panel:  Recent Labs Lab 08/03/14 0436 08/04/14 0445 08/05/14 0835 08/06/14 0520 08/08/14 0343 08/09/14 0645  NA 138 138 139 140 129* 135*  K 3.1* 3.3* 3.9 3.9 4.4 4.2  CL 90* 90* 98 100 93* 97  CO2 32 34* 28 28 25 28   GLUCOSE 145* 150* 178* 168* 122* 137*  BUN 19 24* 30* 34* 38* 30*  CREATININE 1.53* 1.50* 1.52* 1.30* 1.27* 1.07  CALCIUM 9.1 9.5 8.8 8.8 9.1 9.2  MG 1.6 2.3  --  2.1  --  1.9  PHOS 4.5  --   --  3.1  --  4.0   GFR Estimated Creatinine Clearance: 54.4 ml/min (by C-G formula based on Cr of 1.07). Liver Function Tests:  Recent Labs Lab 08/03/14 0436 08/06/14 0520 08/09/14 0645  AST 34 20 24  ALT 47* 21 22  ALKPHOS 87 73 113  BILITOT 0.4 0.4 0.7  PROT 6.9 6.4 6.4  ALBUMIN 2.5* 2.1* 1.8*    CBC:  Recent Labs Lab 08/02/14 1005 08/03/14 0436 08/06/14 0520 08/08/14 0343  WBC 8.5 9.4 19.7* 14.1*  NEUTROABS  --  7.6 18.5*  --   HGB 10.0* 9.6* 8.3* 7.6*  HCT 29.5* 28.5* 25.3* 22.8*  MCV 77.6* 79.2 80.8 78.4  PLT 344 363 183 239   CBG:  Recent Labs Lab 08/07/14 1215 08/07/14 1757 08/08/14 0739 08/08/14 1158 08/09/14 0739  GLUCAP 141* 139* 136* 126* 141*   Lipid Profile No results found for this basename: CHOL, HDL, LDLCALC, TRIG, CHOLHDL, LDLDIRECT,  in the last 72 hours Microbiology Recent Results (from the past 240 hour(s))  MRSA PCR SCREENING     Status: None   Collection Time    08/04/14 10:30 AM      Result Value Ref Range Status   MRSA by PCR NEGATIVE  NEGATIVE Final   Comment:            The GeneXpert MRSA Assay (FDA     approved for NASAL specimens     only), is one component of a     comprehensive MRSA colonization     surveillance program. It is not     intended to diagnose MRSA     infection nor to guide or     monitor treatment for     MRSA infections.     Scheduled Meds: . enoxaparin (LOVENOX) injection  100 mg Subcutaneous Q24H  . HYDROmorphone PCA  0.3 mg/mL   Intravenous 6 times per day  . insulin aspart  0-15 Units Subcutaneous TID WC   Continuous Infusions: . 0.9 % NaCl with KCl 40 mEq / L 10 mL/hr (08/07/14 0923)  . Marland KitchenTPN (CLINIMIX-E) Adult 50 mL/hr at 08/08/14 1735   And  . fat emulsion 250 mL (08/08/14 1736)    Time spent: 25 minutes.   LOS: 13 days   [facility] Pager [phone]. If unable to reach me by pager, please call my cell phone at [phone].  *Please refer to [email or web address], password [email or web address] to get updated schedule on who will round on this patient, as hospitalists switch teams weekly. If 7PM-7AM, please contact night-coverage at [email or web address], password [email or web address] for any overnight needs.  08/09/2014, 8:13 AM

## 2014-08-09 NOTE — Progress Notes (Signed)
IP PROGRESS NOTE  Subjective: She continues to have abdominal pain. She has not tried Roxanol. No bowel movement or flatus. She has not getting out of bed.  Objective: Vital signs in last 24 hours: Temp:  [97.8 F (36.6 C)-98.3 F (36.8 C)] 97.9 F (36.6 C) (09/10 0526) Pulse Rate:  [100-107] 101 (09/10 0526) Resp:  [16-33] 20 (09/10 0741) BP: (113-122)/(65-72) 116/67 mmHg (09/10 0526) SpO2:  [95 %-100 %] 95 % (09/10 0741) FiO2 (%):  [24 %-100 %] 24 % (09/10 0526)  Intake/Output from previous day: 09/09 0701 - 09/10 0700 In: 9929.5 [P.O.:240; I.V.:868.5; CLE:7517] Out: 3325 [Urine:1625; Drains:1700] Intake/Output this shift:   Lungs: Decreased breath sounds with end inspiratory rhonchi at the posterior bases bilaterally, no respiratory distress Cardiac: Regular rate and rhythm Abdomen: distended-left upper quadrant gastrostomy tube, diffuse tenderness Vascular: Trace edema at the right lower leg  Lab Results:   Recent Labs  08/08/14 0343  WBC 14.1*  HGB 7.6*  HCT 22.8*  PLT 239   BMET  Recent Labs  08/08/14 0343 08/09/14 0645  NA 129* 135*  K 4.4 4.2  CL 93* 97  CO2 25 28  GLUCOSE 122* 137*  BUN 38* 30*  CREATININE 1.27* 1.07  CALCIUM 9.1 9.2    Studies/Results: No results found.    Assessment/Plan: 1. 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  Initiation of radiation and concurrent Xeloda 10/16/2013.  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  Cycle 1 adjuvant Xeloda 02/05/2014.  Cycle 2 adjuvant Xeloda 02/26/2014.  Cycle 3 adjuvant Xeloda 03/18/2014.  Cycle 4 adjuvant Xeloda 04/08/2014.  2. Indeterminate right lung nodule on CT 09/29/2013. 3. History of weight loss.  4. Left leg DVT 03/15/2014. On Lovenox. 5. Admission for ileostomy reversal 06/07/2014. 6. Hospitalization 07/11/2014 through 07/14/2014  with a small bowel obstruction. Patient felt to most likely have a narrow area at her small bowel anastomosis due to inflammation after surgery and not a true obstruction. 7. Elevated CEA 07/14/2014. 8. CT chest/abdomen/pelvis 07/18/2014 with a new small right pleural effusion; stable 5 mm posterior right upper lobe lung nodule; gallstones noted within the gallbladder; marked gallbladder wall thickening measuring up to 11 mm in thickness at the fundus; abnormal low density noted in the adjacent inferior liver with small scattered satellite nodules in the adjacent liver. Findings within the liver new since the prior study. Small amount of perihepatic fluid adjacent to the right hepatic lobe and gallbladder. Extensive edema/stranding within the pelvis extending from the presacral space anteriorly. New area of sclerosis right pubic bone. Abdominal ultrasound 07/28/2014 concerning for acute cholecystitis 9. Acute renal failure/hyponatremia-seen by nephrology, renal failure improved with hydration. 10. Bowel obstruction requiring NG tube placement-status post a diagnostic laparoscopy 08/04/2012 confirming extensive carcinomatosis, gastrostomy tube placed 11. Pain secondary to surgery and carcinomatosis        LOS: 13 days    Victoria [name], ANP/GNP-BC 08/09/2014 9:45 AM  I discussed the disposition plans with Victoria [name] and her husband. The plan is for home Hospice care. She is meeting with the Hospice nurse this morning. The plan is to wean the TNA to off and continue a liquid diet as tolerated. She will increase ambulation as tolerated with the cold to remove the Foley catheter. I will discontinue the Dilaudid PCA and add a Duragesic patch.  We began a discussion regarding CPR and ACLS issues. She has not  yet decided on a CODE STATUS.  Recommendations:  1. Duragesic/Roxanol for pain 2. disposition planning for home Hospice care 3. place the gastrostomy tube to straight drain and clamp as  tolerated 4. Ambulate 5. discontinue TNA  Her prognosis appears poor. She will be a candidate for [employer] if her family has difficulty caring for her in the home.

## 2014-08-09 NOTE — Progress Notes (Addendum)
Follow-up: met with patient and husband at bedside to initiate education related to hospice services, philosophy and team approach to care; patient was very quiet during discussion, making very little eye contact with Probation officer. Husband Jeneen Rinks did most of the talking. Writer acknowledged their sorrow over patient's further decline and pt and husband confirmed their understanding that they are seeking a comfort approach to care at this time; They shared that Dr Benay Spice began discussion about Advanced Directives and code status; Mr Frieze asked about information and stated he wants his wife to have time to think about this and further discuss with Dr Benay Spice. AD packet given to Mr Imel as requested and informed if they decided on DNR status while in the hospital, a separate GOLD Form would be filled out by the doctor for them to take home- they voiced understanding.    Per notes and discussion with Dr Benay Spice, staff RN Verline Lema and pt/husband- current plan is to begin mobilizing patient, adjust pain medication, wean TNA to off and attempt trial of G-tube to gravity drain * currently G-Tube to suction unclear if this will be needed at discharge *pt is currently on O2 will need to determine if this will be necessary at discharge [facility] want to see how Ms Stirn does with PT before deciding on means of transport home - Pt is hopeful to be able to transport home by personal car when medically ready for discharge possibly next couple days.  DME needs discussed - pt currently has a shower chair in the home; declines a hospital bed at this time - per discussion pt/ husband request a 3n1 BSC and walker with wheels only; this was discussed and will be ordered by [facility] equipment manager through [facility] to be drop shipped to pt's home per husband request- discussed with husband this DME may arrive tomorrow or Saturday if it is drop-shipped and he stated this was preferred; did discuss possibility of need  for additional DME and perhaps waiting to have this determined (O2, suction) however, Mr Avalos said he prefers to go ahead and put in for these items to be drop shipped and have writer will follow up. DME manager Gayland Curry notified of above.  Dr Benay Spice will be attending physician working with [facility] at discharge; pt's pharmacy is [facility] Initial paperwork faxed to [facility]  Completed d/c summary will need to be faxed to [place] [facility] @ [phone] when final Please notify [facility] when patient is ready to leave unit at d/c call [phone] (or [phone] if after 5 pm);  [facility] information and contact numbers also given to pt/husband during visit.   Above information shared with [facility] Please call with any questions or concerns   Danton Sewer, RN 08/09/2014, 9:26 AM [facility] Liaison [phone]

## 2014-08-09 NOTE — Plan of Care (Signed)
Problem: Phase III Progression Outcomes Goal: Other Phase III Outcomes/Goals Outcome: Progressing Patient maxed out PCA  Hour limit at least 2 times since 11 pm. Patient's demands "greatly" above boluses administered. (checked once and had demanded 64 times in < 2 hours but only received 4 boluses). Patient able to  Sleep though.

## 2014-08-10 DIAGNOSIS — Z86718 Personal history of other venous thrombosis and embolism: Secondary | ICD-10-CM

## 2014-08-10 LAB — CBC
HEMATOCRIT: 22.5 % — AB (ref 36.0–46.0)
Hemoglobin: 7.6 g/dL — ABNORMAL LOW (ref 12.0–15.0)
MCH: 25.9 pg — ABNORMAL LOW (ref 26.0–34.0)
MCHC: 33.8 g/dL (ref 30.0–36.0)
MCV: 76.5 fL — AB (ref 78.0–100.0)
Platelets: 316 10*3/uL (ref 150–400)
RBC: 2.94 MIL/uL — ABNORMAL LOW (ref 3.87–5.11)
RDW: 16.1 % — AB (ref 11.5–15.5)
WBC: 10.7 10*3/uL — AB (ref 4.0–10.5)

## 2014-08-10 MED ORDER — MORPHINE SULFATE (CONCENTRATE) 10 MG /0.5 ML PO SOLN
20.0000 mg | ORAL | Status: DC
  Administered 2014-08-10 – 2014-08-13 (×34): 20 mg via ORAL
  Filled 2014-08-10 (×32): qty 1

## 2014-08-10 MED ORDER — BOOST PLUS PO LIQD
237.0000 mL | Freq: Two times a day (BID) | ORAL | Status: DC | PRN
  Filled 2014-08-10 (×3): qty 237

## 2014-08-10 MED ORDER — SODIUM CHLORIDE 0.9 % IV SOLN
0.5000 mg/h | INTRAVENOUS | Status: DC
  Administered 2014-08-10: 0.5 mg/h via INTRAVENOUS
  Filled 2014-08-10: qty 2.5

## 2014-08-10 MED ORDER — HYDROMORPHONE HCL PF 2 MG/ML IJ SOLN
2.0000 mg | INTRAMUSCULAR | Status: DC | PRN
  Administered 2014-08-10 – 2014-08-12 (×14): 2 mg via INTRAVENOUS
  Filled 2014-08-10 (×16): qty 1

## 2014-08-10 MED ORDER — HYDROMORPHONE BOLUS VIA INFUSION
1.0000 mg | INTRAVENOUS | Status: DC | PRN
  Filled 2014-08-10: qty 1

## 2014-08-10 MED ORDER — MORPHINE SULFATE (CONCENTRATE) 10 MG /0.5 ML PO SOLN
20.0000 mg | ORAL | Status: DC | PRN
  Filled 2014-08-10 (×2): qty 1

## 2014-08-10 MED ORDER — HYDROMORPHONE HCL PF 1 MG/ML IJ SOLN
1.0000 mg | INTRAMUSCULAR | Status: DC | PRN
  Filled 2014-08-10: qty 1

## 2014-08-10 MED ORDER — MORPHINE SULFATE (CONCENTRATE) 10 MG /0.5 ML PO SOLN: 20.0000 mg | ORAL | Status: DC | PRN

## 2014-08-10 MED ORDER — FENTANYL 50 MCG/HR TD PT72: 50.0000 ug | MEDICATED_PATCH | TRANSDERMAL | Status: DC

## 2014-08-10 MED ORDER — FENTANYL 50 MCG/HR TD PT72
50.0000 ug | MEDICATED_PATCH | TRANSDERMAL | Status: DC
  Administered 2014-08-10: 50 ug via TRANSDERMAL
  Filled 2014-08-10: qty 1

## 2014-08-10 MED ORDER — MORPHINE SULFATE (CONCENTRATE) 10 MG /0.5 ML PO SOLN
20.0000 mg | ORAL | Status: DC
  Administered 2014-08-10: 20 mg via ORAL
  Filled 2014-08-10: qty 1

## 2014-08-10 NOTE — Progress Notes (Signed)
Follow-up to confirm discharge plans- pt and husband seen in room; discussed with pt that since her discussion with Dr Rama this morning, she has been on IV Dilaudid 0.5 mg continuous,using one breakthrough dose prior to ambulating this afternoon; began discussion about home supplies for medication, however pt's husband stated they understood that the Fentanyl patch was to be increased and the liquid Morphine was to be increased and they would see how she responded - patient and husband both stated they would like to revert to that plan because they wanted the "least restrictive means possible to keep her comfortable at home". Dr Rockne Menghini was notified and came to room, spoke with pt, husband, staff RN Joelene Millin and Probation officer also present. Current plan is to continue to adjust pain medication regimen to achieve desired symptom control 'off IV infusion'.   - Patient has requested to leave Foley catheter in for now until pain medication adjustments in place-as she wants to be sure she will be able to transfer to [facility] with as little discomfort as possible- staff RN Joelene Millin to address.      -Pt/husband agreeable to leaving R PICC line in place at discharge in the event it is necessary in the future. Discharge summary will need to reflect PICC to be flushed and capped at d/c with discharge care per hospice protocol. [facility] hospital liaison will follow up Monday should pt remain in the hospital through the weekend. Writer has made [place] infusion RN aware IV infusion will not be part of discharge plan. [facility] aware patient's discharge on hold; if plans change over the weekend, staff and family can contact [place] on-call staff at [phone].  Danton Sewer, RN MSN [facility] Liaison [phone]

## 2014-08-10 NOTE — Progress Notes (Signed)
IP PROGRESS NOTE  Subjective: She reports persistent abdominal pain. Roxanol does not get adequate pain relief. She was able to get out of bed yesterday. She met with the Hospice team and is making plans for Home hospice.  Objective: Vital signs in last 24 hours: Temp:  [97.6 F (36.4 C)-98.2 F (36.8 C)] 98.2 F (36.8 C) (09/11 0458) Pulse Rate:  [103-117] 103 (09/11 0458) Resp:  [20-28] 20 (09/11 0458) BP: (118-131)/(72-74) 120/74 mmHg (09/11 0458) SpO2:  [96 %-99 %] 99 % (09/11 0458) Weight:  [164 lb 10.9 oz (74.7 kg)] 164 lb 10.9 oz (74.7 kg) (09/11 0458)  Intake/Output from previous day: 09/10 0701 - 09/11 0700 In: -  Out: 1725 [Urine:575; Drains:1150] Intake/Output this shift:    Abdomen: distended-left upper quadrant gastrostomy tube, diffuse tenderness Right arm PICC without erythema  Lab Results:   Recent Labs  08/08/14 0343 08/10/14  WBC 14.1* 10.7*  HGB 7.6* 7.6*  HCT 22.8* 22.5*  PLT 239 316   BMET  Recent Labs  08/08/14 0343 08/09/14 0645  NA 129* 135*  K 4.4 4.2  CL 93* 97  CO2 25 28  GLUCOSE 122* 137*  BUN 38* 30*  CREATININE 1.27* 1.07  CALCIUM 9.1 9.2    Studies/Results: No results found.    Assessment/Plan: 1. 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  Initiation of radiation and concurrent Xeloda 10/16/2013.  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  Cycle 1 adjuvant Xeloda 02/05/2014.  Cycle 2 adjuvant Xeloda 02/26/2014.  Cycle 3 adjuvant Xeloda 03/18/2014.  Cycle 4 adjuvant Xeloda 04/08/2014.  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 Indeterminate right lung nodule on CT 09/29/2013. 3. History of weight loss.  4. Left leg DVT 03/15/2014. On Lovenox. 5. Admission for ileostomy reversal 06/07/2014. 6. Hospitalization 07/11/2014 through 07/14/2014  with a small bowel obstruction. Patient felt to most likely have a narrow area at her small bowel anastomosis due to inflammation after surgery and not a true obstruction. 7. Elevated CEA 07/14/2014. 8. CT chest/abdomen/pelvis 07/18/2014 with a new small right pleural effusion; stable 5 mm posterior right upper lobe lung nodule; gallstones noted within the gallbladder; marked gallbladder wall thickening measuring up to 11 mm in thickness at the fundus; abnormal low density noted in the adjacent inferior liver with small scattered satellite nodules in the adjacent liver. Findings within the liver new since the prior study. Small amount of perihepatic fluid adjacent to the right hepatic lobe and gallbladder. Extensive edema/stranding within the pelvis extending from the presacral space anteriorly. New area of sclerosis right pubic bone. Abdominal ultrasound 07/28/2014 concerning for acute cholecystitis 9. Acute renal failure/hyponatremia-seen by nephrology, renal failure improved with hydration. 10. Bowel obstruction requiring NG tube placement-status post a diagnostic laparoscopy 08/04/2014 confirming extensive carcinomatosis, gastrostomy tube placed 11. Pain secondary to surgery and carcinomatosis        LOS: 14 days    [place], ANP/GNP-BC 08/10/2014 8:26 AM  She appears unchanged. TNA has been discontinued and the gastrostomy tube is now to straight drain. The Foley catheter will be removed today. The plan is for home Hospice care.  I will increase the Duragesic patch and Roxanol doses today. She would like to keep the PICC in place for now.  We discussed the risk/benefit of continuing Lovenox. I think it is reasonable to discontinue Lovenox since the goal is comfort care. She would  like to discuss this with her husband prior to making a decision on discontinuing anticoagulation.  Recommendations:  1. increase dose of Duragesic and Roxanol 2. continue disposition planning for home  Hospice care 3. DC Foley catheter 4. Ambulate  I will arrange for outpatient followup. Please call oncology over the weekend as needed.

## 2014-08-10 NOTE — Progress Notes (Signed)
Physical Therapy Treatment Patient Details Name: Victoria [name] MRN: [id number] DOB: Jul 04, 1951 Today's Date: 08/10/2014    History of Present Illness 63 yo female s/p lap G tube, biopsy, lysis of adhesions 9/5. Being followed by home hospice.     PT Comments    Progressing slowly with mobility. Able to ambulate ~25 feet with walker. Very deconditioned. Unsure if pt will be able to ascend/descend steps inside home.   Follow Up Recommendations  Home health PT;Supervision/Assistance - 24 hour     Equipment Recommendations  Rolling walker with 5" wheels;3in1 (PT);Hospital bed    Recommendations for Other Services OT consult     Precautions / Restrictions Precautions Precautions: Fall Precaution Comments: abd surgery; multiple lines/leads Restrictions Weight Bearing Restrictions: No    Mobility  Bed Mobility Overal bed mobility: Needs Assistance Bed Mobility: Supine to Sit     Supine to sit: Min guard;HOB elevated        Transfers Overall transfer level: Needs assistance Equipment used: Rolling walker (2 wheeled) Transfers: Sit to/from Stand Sit to Stand: Min assist         General transfer comment: assist to rise, stabilize, control descent. VCs safety, technique, handplacement  Ambulation/Gait Ambulation/Gait assistance: Min assist;+2 safety/equipment Ambulation Distance (Feet): 25 Feet Assistive device: Rolling walker (2 wheeled) Gait Pattern/deviations: Step-through pattern;Decreased stride length     General Gait Details: slow gait speed. asssit to stabilize. fatigues easily. followed with recliner.    Stairs            Wheelchair Mobility    Modified Rankin (Stroke Patients Only)       Balance                                    Cognition Arousal/Alertness: Awake/alert Behavior During Therapy: WFL for tasks assessed/performed Overall Cognitive Status: Within Functional Limits for tasks assessed                       Exercises      General Comments        Pertinent Vitals/Pain Pain Assessment: 0-10 Pain Score: 7  Pain Location: abdomen Pain Intervention(s): Premedicated before session;Monitored during session;Limited activity within patient's tolerance    Home Living                      Prior Function            PT Goals (current goals can now be found in the care plan section) Progress towards PT goals: Progressing toward goals (slowly)    Frequency  Min 3X/week    PT Plan Current plan remains appropriate    Co-evaluation             End of Session   Activity Tolerance: Patient limited by fatigue;Patient limited by pain Patient left: in chair;with call bell/phone within reach;with family/visitor present     Time: 4818-5631 PT Time Calculation (min): 36 min  Charges:  $Gait Training: 8-22 mins $Therapeutic Activity: 8-22 mins                    G Codes:      Weston Anna, MPT Pager: [phone]

## 2014-08-10 NOTE — Progress Notes (Signed)
Follow-up: currently pt not medically ready for discharge; continues to have increased pain 6-8/10 with  PRN med use- 160 mg Roxanol and 10 mg IV Dilaudid in the last 24 hrs adjustments are being made to current medication regimen.  Per discussion with attending Dr Rockne Menghini there is consideration for initiation of continuous IV pain medication with bolus dosing via R PICC line; assess pt's response and then discharge with continuous regimen in place. Pt seen at bedside alert, mother is with her in the room; pt appears tired, drawn, voiced she has talked with Dr Rockne Menghini and is aware of adjustments being made to medications; stated she is unsure whether she will be able to discharge tomorrow or not. [facility] will notify [facility] Infusion RN Carolynn Sayers of this possibility as the home infusion medication and supplies would need to be co-ordinated with [facility] and [place] on-call staff -should d/c be planned for weekend [facility] Liaison will follow-up later this afternoon with primary team to attempt to confirm plan.  Danton Sewer RN MSN [facility] Liaison [phone]

## 2014-08-10 NOTE — Progress Notes (Signed)
NUTRITION FOLLOW UP  Intervention:   -Diet advancement per MD -Boost Plus PRN -Will continue to monitor  Nutrition Dx:   Inadequate oral intake related to altered GI function as evidenced by npo and high output per NG; now as evidenced by PO intake < 75%  Goal: TPN to meet >/= 90% of their estimated nutrition needs; TPN d/c   New Goal:   Tolerate diet advancement with intake of meals and supplements to meet >90% estimated needs; not met   Monitor:   Diet order, total protein/energy intake, labs, weights, GI profile  Assessment:   8/30: -Patient currently asleep. Clear when arouses per nurse.  -Unable to tolerate clear liquid diet.  -Now NPO with NG tube in place. 2L output thus far.  -Drinks Boost at home but other intake unknown at this time. -Hx includes severe protein calorie malnutrition.  9/03: -MD noted pt continues with ileus vs pSBO -NGT continues on suction, today output 350 ml. Last BM on 8/30, having flatus -Was seen by RD during previous admit in 07/13/2014, reported poor PO intake for > one month and endorsed an 18 lb weight loss in one month. Had been d/c'd on full liquid/pureed diet w/Boost supplement TID. -However, pt reported only tolerating Boost BID-TID past 2-3 weeks. Consumed some bites of jello, but unable to tolerate puree foods - RD Re-estimated needs d/t weight loss and poor PO -Pt to have PICC, plan to initiate TPN. Pharmacy plan to start Clinimix 5/20 at 35 ml/hr and titrate to goal rate of 65 ml/hr with 20% lipids at 10 ml/hr -Goal rate would provide 1852 kcal (100% est kcal needs), 78 gram protein (98% est protein needs) -Mg Low/Phos WNL on 8/31. K low-being repleted. Pt at risk of refeeding d/t poor PO > one month, and hx of significant weight loss. Monitor and replete as needed -CBGs < 150 mg/dl  9/4: -NG tube in place for decompression.  Significant output through NG tube and may need surger in the next 24 hours if obstruction does not improve  per MD. -TPN at 35 ml/hr with Clinimix 5/20 .  TPN to titrate to goal rate of 65 ml/hr with 20% lipids at 10 ml/hr which would provide:  1852 kcal (100% est kcal needs, 78 gm protein (98% est protein needs).   -Labs reviewed.  Potassium 3.1 being repleted.  Magnesium 1.6 and Phosphorus 4.5 wnl.   9/11: -Clinimix E 5/20 at goal rate of 65 ml/hr on 9/06, began wean of TPN to 50 ml/hr on 9/08. Reduced to 25 ml/hr on 9/10 to be d/c'd at 12N that day per pharmacy note. Phos/Mg/K WNL, CBGs < 150 mg/dl. -Diet advanced to Full Liquid on 9/11. Pt continues with abd pain and 0% PO intake -Gtube set on suction, RN documented 1150 ml output on 9/10 -Last BM on 8/30, not passing flatus. Working with PT/OT to increase ambulation -Plan for home with hospice. Will order Boost PRN if requested by pt for comfort nutrition  Height: Ht Readings from Last 1 Encounters:  07/29/14 5' 5" (1.651 m)    Weight Status:   Wt Readings from Last 1 Encounters:  08/10/14 164 lb 10.9 oz (74.7 kg)  07/29/14 152 lbs  Re-estimated needs:  Kcal: 1700-1800  Protein: 80-90 gm  Fluid: 1.7L daily   Diet Order: Full Liquid   Intake/Output Summary (Last 24 hours) at 08/10/14 1158 Last data filed at 08/10/14 1045  Gross per 24 hour  Intake      0 ml    Output   5525 ml  Net  -5525 ml    Last BM: 8/30   Labs:   Recent Labs Lab 08/04/14 0445  08/06/14 0520 08/08/14 0343 08/09/14 0645  NA 138  < > 140 129* 135*  K 3.3*  < > 3.9 4.4 4.2  CL 90*  < > 100 93* 97  CO2 34*  < > _0 BUN 24*  < > 34* 38* 30*  CREATININE 1.50*  < > 1.30* 1.27* 1.07  CALCIUM 9.5  < > 8.8 9.1 9.2  MG 2.3  --  2.1  --  1.9  PHOS  --   --  3.1  --  4.0  GLUCOSE 150*  < > 168* 122* 137*  < > = values in this interval not displayed.  CBG (last 3)   Recent Labs  08/08/14 1726 08/09/14 0739 08/09/14 1203  GLUCAP 130* 141* 132*    Scheduled Meds: . antiseptic oral rinse  7 mL Mouth Rinse q12n4p  . chlorhexidine  15 mL  Mouth Rinse BID  . cycloSPORINE  1 drop Both Eyes BID  . enoxaparin (LOVENOX) injection  100 mg Subcutaneous Q24H  . sodium chloride  10-40 mL Intracatheter Q12H    Continuous Infusions: . 0.9 % NaCl with KCl 40 mEq / L 10 mL/hr (08/07/14 0923)  . HYDROmorphone 0.5 mg/hr (08/10/14 1000)   Atlee Abide MS RD LDN Clinical Dietitian [phone]

## 2014-08-10 NOTE — Progress Notes (Signed)
Progress Note   Victoria [name]:[id number] DOB: August 22, 1951 DOA: 07/27/2014 PCP: Irven Shelling, MD  Brief narrative: 63 year old female with history of rectal adenocarcinoma, status post radiation and chemotherapy, status post resection and diverting ileostomy in 12/2013, ileostomy reversal on 06/07/2014, history of DVT on anticoagulation with Lovenox, recent hospitalization for small bowel obstruction 07/11/14 - 07/14/2014 who presented from cancer center with nausea and vomiting, failure to thrive. Abdominal US showed possible acute cholecystitis for which she was started on zosyn. In addition, she was found to have severe hyponatremia of 107 but it has improved with IV normal saline. Her creatinine was 4.62 on admission which was thought to be reflective of recent CT abdomen with contrast she had 07/18/2014.  Her hospital course is complicated by ongoing small bowel obstruction. She underwent diagnostic laparoscopy on 08/04/2014 with palliative G tube for decompression.   Assessment & Plan   Principal Problem:  Abdominal distention / Small bowel obstruction / Peritoneal carcinomatosis   Initial abd x ray showed gaseous distention and NG tube was placed for decompression. Patient continued to have quite a significant output through NG tube. She underwent diagnostic laparoscopy with lysis of adhesions, peritoneal biopsy of carcinomatosis and laparoscopic placement of PEG 08/04/2014 which may be clamped to see if patient can tolerate this with un-clamping PRN nausea. Still having a fair amount of drainage, PEG now placed to gravity drainage.  Full liquid diet ordered, TNA discontinued 08/09/14.  Oncologist following. Most recent recommendation is for hospice care to follow at home on discharge   Pain management with PCA dilaudid discontinued 08/08/14. Given ongoing high pain medication requirements, will switch pain management to continuous infusion of Dilaudid-HP her 0.5 mg per hour every  30 minute boluses when necessary and set her up with home continuous infusion therapy. Plan of care discussed with patient and she is in agreement.  Active Problems:  Acute cholecystitis / Leukocytosis  Acute cholecystitis noted on abdominal US. Status post 9 days of treatment with Zosyn.   WBC trending down.  Rectal adenocarcinoma with liver metastases / Peritoneal carcinomatosis   Patient will be discharged home with hospice care.  Acute renal failure / Contrast induced nephropathy   Acute renal failure felt to be secondary to contrast induced nephropathy. Evaluated by nephrologist on admission.  Renal function back to baseline.   Hypochloremic hyponatremia / metabolic alkalosis / hypokalemia   Likely secondary to dehydration, GI losses.   Most recent BMET shows improved mild hyponatremia/and resolution of hypochloremia.  Metabolic alkalosis and hypokalemia have resolved.  Anemia of chronic disease   Likely due to history of malignancy. Hemoglobin is 8.3. No current indications for transfusion.  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   Severe protein calorie malnutrition   Nutrition consulted. Continue TNA for nutritional support.   DVT prophylaxis   On therapeutic anticoagulation with Lovenox.  Code Status: Full.  Family Communication: Mother is at the bedside, although the patient does not want her plan of care discussed in front of her mother. Disposition Plan:  Home with hospice when stable.   IV Access / Drains   Peripheral IV  PICC line  PEG tube placed 08/04/2014   Procedures and diagnostic studies:   US Abdomen Complete 07/28/2014 1. Gallstones with 1 stone measuring 1.7 cm lodged within the gallbladder neck. Wall is thickened to 5.2 mm. Multiple echogenic foci project along the gallbladder wall. This could reflect a air. Findings support  acute cholecystitis in the proper clinical setting. 2. Hypoechoic liver lesions  which are nonspecific. They do not appear to be cysts. Largest lies in the right lobe measuring 15 mm. Liver shows a diffusely coarsened increased echotexture consistent with hepatic steatosis. 3. No bile duct dilation.  Abdominal X ray 07/29/2014 - abdominal distention likely due to gaseous distention in stomach. Consider NG tube for decompression.  Dg Abd Portable 1v 07/31/2014 Interval decompression of the stomach with NG tube. Persistent gaseous distention of multiple loops of small bowel.  TNA 08/02/2014  Dg Abd 2 Views 08/04/2014 Enteric tube terminates in the distal gastric antrum/proximal duodenum. Dilated loops of small bowel with air-fluid levels in the left mid abdomen, suggesting partial small bowel obstruction or small bowel enteritis. No free air.  DIAGNOSTIC LAPAROSCOPY 08/04/2014; LAPAROSCOPIC LYSIS OF ADHESIONS; PERITONEAL BIOPSY OF CARCINOMATOSIS; LAPAROSCOPIC PLACEMENT OF GASTROSTOMY TUBE   Medical Consultants:   Oncology (Dr. Betsy Coder)  Nephrology (Dr. Roney Jaffe)  Surgery (Dr. Leighton Ruff and Dr. Michael Boston)   Other Consultants:   Nutrition   Anti-Infectives:   Zosyn 07/27/2014 --> 08/04/2014  HPI/Subjective: Patient continues to have significant abdominal pain and requires maximal doses of all of her when necessary including Roxanol and IV Dilaudid-HP. No other complaints. No nausea/vomiting.  Objective: Filed Vitals:   08/09/14 1002 08/09/14 1500 08/09/14 2211 08/10/14 0458  BP: 118/72 131/74 122/72 120/74  Pulse: 106 117 113 103  Temp: 98 F (36.7 C) 97.7 F (36.5 C) 97.6 F (36.4 C) 98.2 F (36.8 C)  TempSrc: Oral Oral Oral Oral  Resp: 28 26 22 20   Height:      Weight:    74.7 kg (164 lb 10.9 oz)  SpO2: 99% 98% 96% 99%    Intake/Output Summary (Last 24 hours) at 08/10/14 0816 Last data filed at 08/09/14 1814  Gross per 24 hour  Intake      0 ml  Output   1725 ml  Net  -1725 ml    Exam:  Gen:  NAD Cardiovascular:  RRR, No M/R/G Respiratory:  Lungs CTAB Gastrointestinal: Abdomen soft, NT/ND with normal active bowel sounds. Extremities: No C/E/C  Data Reviewed: Basic Metabolic Panel:  Recent Labs Lab 08/04/14 0445 08/05/14 0835 08/06/14 0520 08/08/14 0343 08/09/14 0645  NA 138 139 140 129* 135*  K 3.3* 3.9 3.9 4.4 4.2  CL 90* 98 100 93* 97  CO2 34* 28 28 25 28   GLUCOSE 150* 178* 168* 122* 137*  BUN 24* 30* 34* 38* 30*  CREATININE 1.50* 1.52* 1.30* 1.27* 1.07  CALCIUM 9.5 8.8 8.8 9.1 9.2  MG 2.3  --  2.1  --  1.9  PHOS  --   --  3.1  --  4.0   GFR Estimated Creatinine Clearance: 55.2 ml/min (by C-G formula based on Cr of 1.07). Liver Function Tests:  Recent Labs Lab 08/06/14 0520 08/09/14 0645  AST 20 24  ALT 21 22  ALKPHOS 73 113  BILITOT 0.4 0.7  PROT 6.4 6.4  ALBUMIN 2.1* 1.8*    CBC:  Recent Labs Lab 08/06/14 0520 08/08/14 0343 08/10/14  WBC 19.7* 14.1* 10.7*  NEUTROABS 18.5*  --   --   HGB 8.3* 7.6* 7.6*  HCT 25.3* 22.8* 22.5*  MCV 80.8 78.4 76.5*  PLT 183 239 316   CBG:  Recent Labs Lab 08/08/14 0739 08/08/14 1158 08/08/14 1726 08/09/14 0739 08/09/14 1203  GLUCAP 136* 126* 130* 141* 132*   Lipid Profile No results found for this  basename: CHOL, HDL, LDLCALC, TRIG, CHOLHDL, LDLDIRECT,  in the last 72 hours Microbiology Recent Results (from the past 240 hour(s))  MRSA PCR SCREENING     Status: None   Collection Time    08/04/14 10:30 AM      Result Value Ref Range Status   MRSA by PCR NEGATIVE  NEGATIVE Final   Comment:            The GeneXpert MRSA Assay (FDA     approved for NASAL specimens     only), is one component of a     comprehensive MRSA colonization     surveillance program. It is not     intended to diagnose MRSA     infection nor to guide or     monitor treatment for     MRSA infections.     Scheduled Meds: . enoxaparin (LOVENOX) injection  100 mg Subcutaneous Q24H  . HYDROmorphone PCA 0.3 mg/mL   Intravenous 6 times per day  . insulin aspart  0-15  Units Subcutaneous TID WC   Continuous Infusions: . 0.9 % NaCl with KCl 40 mEq / L 10 mL/hr (08/07/14 0923)    Time spent: 25 minutes.   LOS: 14 days   [facility] Pager [phone]. If unable to reach me by pager, please call my cell phone at [phone].  *Please refer to [email or web address], password [email or web address] to get updated schedule on who will round on this patient, as hospitalists switch teams weekly. If 7PM-7AM, please contact night-coverage at [email or web address], password [email or web address] for any overnight needs.  08/10/2014, 8:16 AM

## 2014-08-11 LAB — GLUCOSE, CAPILLARY
GLUCOSE-CAPILLARY: 107 mg/dL — AB (ref 70–99)
GLUCOSE-CAPILLARY: 112 mg/dL — AB (ref 70–99)
Glucose-Capillary: 121 mg/dL — ABNORMAL HIGH (ref 70–99)
Glucose-Capillary: 105 mg/dL — ABNORMAL HIGH (ref 70–99)

## 2014-08-11 MED ORDER — FENTANYL 75 MCG/HR TD PT72
75.0000 ug | MEDICATED_PATCH | TRANSDERMAL | Status: DC
  Administered 2014-08-13: 75 ug via TRANSDERMAL
  Filled 2014-08-11: qty 1

## 2014-08-11 NOTE — Progress Notes (Signed)
PT Cancellation Note  Patient Details Name: Victoria [name] MRN: [id number] DOB: [birth year]   Cancelled Treatment:    Reason Eval/Treat Not Completed: Pain limiting ability to participate (pt politely declined PT at this time. Will check back another day/time. )   Weston Anna, MPT Pager: [phone]

## 2014-08-11 NOTE — Progress Notes (Signed)
Unable to clamp drainage tube due to amount if fluids patient taking by mouth and high out put of drainage tube.

## 2014-08-11 NOTE — Progress Notes (Signed)
[place] for Lovenox Indication: History of DVT  Allergies  Allergen Reactions  . Ointment Base [Lanolin-Petrolatum] Rash    Specifically burn ointments.    Patient Measurements: Height: 5\' 5"  (165.1 cm) Weight: 164 lb 10.9 oz (74.7 kg) IBW/kg (Calculated) : 57  Vital Signs: Temp: 98 F (36.7 C) (09/12 0556) Temp src: Oral (09/12 0556) BP: 112/70 mmHg (09/12 0556) Pulse Rate: 101 (09/12 0556)  Labs:  Recent Labs  08/09/14 0645 08/10/14  HGB  --  7.6*  HCT  --  22.5*  PLT  --  316  CREATININE 1.07  --     Estimated Creatinine Clearance: 55.2 ml/min (by C-G formula based on Cr of 1.07).   Medical History: Past Medical History  Diagnosis Date  . Medical history non-contributory   . HDL deficiency   . Allergy     seasonal allergic rhinitis  . History of radiation therapy 10/16/13-11/27/13    rectal 50.4Gy  . Peripheral vascular disease 4/15    DVT LEFT LEG  . Cancer 09/19/13    rectum    Assessment: Pt with h/o rectal cancer and now with liver mets was admitted from [facility] on 8/28 for FTT and dehydration. Pt was on Lovenox 100mg  SQ Q24h PTA for L leg DVT diagnosed in 03/15/14 with last dose 8/27@1730 .  Noted to be in AKI at admission and lovenox dosed on anti-Xa levels.  Renal function resolved to baseline on 9/3.  Also, patient has prolonged ileus/pSBO on TPN, s/p lap LOA and placement of PEG on 9/5.  Pharmacy is consulted to continue enoxaparin while inpatient   Hgb stable at 7.6  Platelets ok  SCr improved to 1.07, now stablized; CrCl 55 ml/min  Significant Events: 9/2: SCr improved to approximately baseline 9/4: Lovenox held in anticipation of surgery on 9/5 9/5: surgery performed: diagnostic laproscopy, laproscopic lysis of adhesions, peritoneal biopsy of carcinomatosis, laproscopic placement of gastrostomy tube 9/6: Lovenox resumed 9/12: patient to be discharged with home hospice once pain is adequately  controlled  Goal of Therapy:  Appropriate anticoagulation for patient with hx of DVT Monitor PLT with anticoagulation protocol: Yes   Plan:   Continue Lovenox 100mg  subq Q24H  Follow renal function, CBC   Thank you for the consult.  Currie Paris, PharmD, BCPS Pager: [phone] Pharmacy: [phone] 08/11/2014 1:21 PM

## 2014-08-11 NOTE — Progress Notes (Addendum)
Progress Note   Victoria [name]:[id number] DOB: [birth year] DOA: 07/27/2014 PCP: Irven Shelling, MD  Brief narrative: 63 year old female with history of rectal adenocarcinoma, status post radiation and chemotherapy, status post resection and diverting ileostomy in 12/2013, ileostomy reversal on 06/07/2014, history of DVT on anticoagulation with Lovenox, recent hospitalization for small bowel obstruction 07/11/14 - 07/14/2014 who presented from cancer center with nausea and vomiting, failure to thrive. Abdominal US showed possible acute cholecystitis for which she was started on zosyn. In addition, she was found to have severe hyponatremia of 107 but it has improved with IV normal saline. Her creatinine was 4.62 on admission which was thought to be reflective of recent CT abdomen with contrast she had 07/18/2014.  Her hospital course is complicated by ongoing small bowel obstruction. She underwent diagnostic laparoscopy on 08/04/2014 with palliative G tube for decompression.   Assessment & Plan   Principal Problem:  Abdominal distention / Small bowel obstruction / Peritoneal carcinomatosis   Initial abd x ray showed gaseous distention and NG tube was placed for decompression. Patient continued to have quite a significant output through NG tube. She underwent diagnostic laparoscopy with lysis of adhesions, peritoneal biopsy of carcinomatosis and laparoscopic placement of PEG 08/04/2014.  Has tolerated limited clamping of the PEG.  Full liquid diet ordered, TNA discontinued 08/09/14.  Oncologist following. Most recent recommendation is for hospice care to follow at home on discharge   Pain management with PCA dilaudid discontinued 08/08/14. Long discussion held with patient/husband and they wish to try managing her pain with a fentanyl patch and oral morphine (as opposed to continuous IV drip of Dilaudid). She continues to have extremely high pain medication requirements, so doubt her pain  will be adequately managed with these medications as she continues to require intermittent and IV Dilaudid-HP for pain control despite having these things in place.  Used 12 mg of dilaudid over past 24 hours IN ADDITION to 20 mg Roxanol Q 2 hours and a 50 mcg fentanyl patch.  Will increase fentanyl patch to 75 mcg.  Active Problems:  Acute cholecystitis / Leukocytosis  Acute cholecystitis noted on abdominal US. Status post 9 days of treatment with Zosyn.   WBC trending down.  Rectal adenocarcinoma with liver metastases / Peritoneal carcinomatosis   Patient will be discharged home with hospice care.  Acute renal failure / Contrast induced nephropathy   Acute renal failure felt to be secondary to contrast induced nephropathy. Evaluated by nephrologist on admission.  Renal function back to baseline.   Hypochloremic hyponatremia / metabolic alkalosis / hypokalemia   Likely secondary to dehydration, GI losses.   Most recent BMET shows improved mild hyponatremia/and resolution of hypochloremia.  Metabolic alkalosis and hypokalemia have resolved.  Anemia of chronic disease   Likely due to history of malignancy. Hemoglobin is 8.3. No current indications for transfusion.  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   Severe protein calorie malnutrition   Nutrition consulted. Continue TNA for nutritional support.   DVT prophylaxis   On therapeutic anticoagulation with Lovenox.  Code Status: Full.  Family Communication: Mother and brother at bedside, asked to step out per patient request. Disposition Plan:  Home with hospice when stable.   IV Access / Drains   Peripheral IV  PICC line  PEG tube placed 08/04/2014   Procedures and diagnostic studies:   US Abdomen Complete 07/28/2014 1. Gallstones with 1 stone measuring 1.7 cm lodged within  the gallbladder neck. Wall is thickened to 5.2 mm. Multiple echogenic foci project along the gallbladder  wall. This could reflect a air. Findings support acute cholecystitis in the proper clinical setting. 2. Hypoechoic liver lesions which are nonspecific. They do not appear to be cysts. Largest lies in the right lobe measuring 15 mm. Liver shows a diffusely coarsened increased echotexture consistent with hepatic steatosis. 3. No bile duct dilation.  Abdominal X ray 07/29/2014 - abdominal distention likely due to gaseous distention in stomach. Consider NG tube for decompression.  Dg Abd Portable 1v 07/31/2014 Interval decompression of the stomach with NG tube. Persistent gaseous distention of multiple loops of small bowel.  TNA 08/02/2014  Dg Abd 2 Views 08/04/2014 Enteric tube terminates in the distal gastric antrum/proximal duodenum. Dilated loops of small bowel with air-fluid levels in the left mid abdomen, suggesting partial small bowel obstruction or small bowel enteritis. No free air.  DIAGNOSTIC LAPAROSCOPY 08/04/2014; LAPAROSCOPIC LYSIS OF ADHESIONS; PERITONEAL BIOPSY OF CARCINOMATOSIS; LAPAROSCOPIC PLACEMENT OF GASTROSTOMY TUBE   Medical Consultants:   Oncology (Dr. Betsy Coder)  Nephrology (Dr. Roney Jaffe)  Surgery (Dr. Leighton Ruff and Dr. Michael Boston)   Other Consultants:   Nutrition   Anti-Infectives:   Zosyn 07/27/2014 --> 08/04/2014  HPI/Subjective: [name] refused to ambulate with PT today.  She still is having VERY high pain medication needs to control her pain.  She is taking only sips of liquids.  Objective: Filed Vitals:   08/10/14 0458 08/10/14 1500 08/10/14 2102 08/11/14 0556  BP: 120/74 132/86 117/72 112/70  Pulse: 103 110 111 101  Temp: 98.2 F (36.8 C) 98.4 F (36.9 C) 98.4 F (36.9 C) 98 F (36.7 C)  TempSrc: Oral Oral Oral Oral  Resp: 20 24 20 20   Height:      Weight: 74.7 kg (164 lb 10.9 oz)     SpO2: 99% 98% 95% 96%    Intake/Output Summary (Last 24 hours) at 08/11/14 0835 Last data filed at 08/11/14 0556  Gross per 24 hour  Intake      0 ml    Output   8250 ml  Net  -8250 ml    Exam:  Gen:  NAD Cardiovascular:  Tachycardic, No M/R/G Respiratory: Lungs CTAB Gastrointestinal: Abdomen soft, NT/ND with normal active bowel sounds. Extremities: No C/E/C  Data Reviewed: Basic Metabolic Panel:  Recent Labs Lab 08/05/14 0835 08/06/14 0520 08/08/14 0343 08/09/14 0645  NA 139 140 129* 135*  K 3.9 3.9 4.4 4.2  CL 98 100 93* 97  CO2 28 28 25 28   GLUCOSE 178* 168* 122* 137*  BUN 30* 34* 38* 30*  CREATININE 1.52* 1.30* 1.27* 1.07  CALCIUM 8.8 8.8 9.1 9.2  MG  --  2.1  --  1.9  PHOS  --  3.1  --  4.0   GFR Estimated Creatinine Clearance: 55.2 ml/min (by C-G formula based on Cr of 1.07). Liver Function Tests:  Recent Labs Lab 08/06/14 0520 08/09/14 0645  AST 20 24  ALT 21 22  ALKPHOS 73 113  BILITOT 0.4 0.7  PROT 6.4 6.4  ALBUMIN 2.1* 1.8*    CBC:  Recent Labs Lab 08/06/14 0520 08/08/14 0343 08/10/14  WBC 19.7* 14.1* 10.7*  NEUTROABS 18.5*  --   --   HGB 8.3* 7.6* 7.6*  HCT 25.3* 22.8* 22.5*  MCV 80.8 78.4 76.5*  PLT 183 239 316   CBG:  Recent Labs Lab 08/08/14 1158 08/08/14 1726 08/09/14 0739 08/09/14 1203 08/11/14 [place]  126* 130* 141* 132* 121*   Microbiology Recent Results (from the past 240 hour(s))  MRSA PCR SCREENING     Status: None   Collection Time    08/04/14 10:30 AM      Result Value Ref Range Status   MRSA by PCR NEGATIVE  NEGATIVE Final   Comment:            The GeneXpert MRSA Assay (FDA     approved for NASAL specimens     only), is one component of a     comprehensive MRSA colonization     surveillance program. It is not     intended to diagnose MRSA     infection nor to guide or     monitor treatment for     MRSA infections.     Scheduled Meds: . enoxaparin (LOVENOX) injection  100 mg Subcutaneous Q24H  . HYDROmorphone PCA 0.3 mg/mL   Intravenous 6 times per day  . insulin aspart  0-15 Units Subcutaneous TID WC   Continuous Infusions: . 0.9 % NaCl  with KCl 40 mEq / L 10 mL/hr (08/07/14 0923)    Time spent: 25 minutes.   LOS: 15 days   [facility] Pager [phone]. If unable to reach me by pager, please call my cell phone at [phone].  *Please refer to [email or web address], password [email or web address] to get updated schedule on who will round on this patient, as hospitalists switch teams weekly. If 7PM-7AM, please contact night-coverage at [email or web address], password [email or web address] for any overnight needs.  08/11/2014, 8:35 AM

## 2014-08-11 NOTE — Progress Notes (Signed)
Patient sat up in chair from 1000 to 1845.

## 2014-08-11 NOTE — Progress Notes (Signed)
Pt seen mostly for social visit.  Family would like to go to hopsice care with as few tubes as possible.  They want to continue to increase dose of Fentanyl patch for baseline pain control.  Also would like to clamp G tube to see how she does tolerating a diet.  I explained to them that most likely she will have nausea if tube is clamped, but it is reasonable to try, since they state this hasn't occurred since her surgery.  Will have RN clamp tube for 2 hrs after meals and open as needed during that time.  We also discussed foley removal.  We talked about the reasons to keep the foley vs taking it out.  They will discuss this further.  They also expressed concerns about not ambulating enough in the hospital.  We talked about this and I tried to explain to husband that as she gets weaker, it becomes harder and harder to ambulate.  I do not think Mr Paull completely grasps that this is an end stage process.  Please call me if you have any questions.

## 2014-08-12 DIAGNOSIS — Z639 Problem related to primary support group, unspecified: Secondary | ICD-10-CM

## 2014-08-12 LAB — GLUCOSE, CAPILLARY
Glucose-Capillary: 105 mg/dL — ABNORMAL HIGH (ref 70–99)
Glucose-Capillary: 100 mg/dL — ABNORMAL HIGH (ref 70–99)

## 2014-08-12 NOTE — Progress Notes (Signed)
Progress Note   Victoria [name] LFY:[id number] DOB: 12/14/1950 DOA: 07/27/2014 PCP: Irven Shelling, MD  Brief narrative: 63 year old female with history of rectal adenocarcinoma, status post radiation and chemotherapy, status post resection and diverting ileostomy in 12/2013, ileostomy reversal on 06/07/2014, history of DVT on anticoagulation with Lovenox, recent hospitalization for small bowel obstruction 07/11/14 - 07/14/2014 who presented from cancer center with nausea and vomiting, failure to thrive. Abdominal US showed possible acute cholecystitis for which she was started on zosyn. In addition, she was found to have severe hyponatremia of 107 but it has improved with IV normal saline. Her creatinine was 4.62 on admission which was thought to be reflective of recent CT abdomen with contrast she had 07/18/2014.  Her hospital course is complicated by ongoing small bowel obstruction. She underwent diagnostic laparoscopy on 08/04/2014 with palliative G tube for decompression.   Assessment & Plan   Principal Problem:  Abdominal distention / Small bowel malignant obstruction / Peritoneal carcinomatosis   Initial abd x ray showed gaseous distention and NG tube was placed for decompression. Patient continued to have quite a significant output through NG tube. She underwent diagnostic laparoscopy with lysis of adhesions, peritoneal biopsy of carcinomatosis and laparoscopic placement of PEG 08/04/2014.  Has tolerated limited clamping of the PEG.  Full liquid diet ordered, TNA discontinued 08/09/14.  Oncologist following. Most recent recommendation is for hospice care to follow at home on discharge   Pain management with PCA dilaudid discontinued 08/08/14. Long discussion held with patient/husband and they wish to try managing her pain with a fentanyl patch and oral morphine (as opposed to continuous IV drip of Dilaudid). She continues to have extremely high pain medication requirements, so doubt  her pain will be adequately managed with these medications as she continues to require intermittent and IV Dilaudid-HP for pain control despite having these things in place.  Used 12 mg of dilaudid over past 24 hours IN ADDITION to 20 mg Roxanol Q 2 hours and a 50 mcg fentanyl patch.  Fentanyl patch increased to 75 mcg 08/11/14 and has needed less IV Dilaudid-HP past 24 hours.  Active Problems:  Family dynamics problem  The patient appears to be afraid of expressing her wishes to her husband, who appears to be very controlling and in particular, wants her to go home with as little equipment as possible. He pushes her to take in large amounts of oral liquids, and the nursing staff feels that this is inappropriate given her malignant obstruction. He wants them to clamp her PEG tube despite the fact that she cannot tolerate this. He also reversed the patient's decision to go home on a continuous Dilaudid-HP infusion for pain control.  The patient also does not want her medical condition discussed in front of her mother, who appears to be actively grieving for her daughter.  The patient's brother is supportive and visits regularly.  Acute cholecystitis / Leukocytosis  Acute cholecystitis noted on abdominal US. Status post 9 days of treatment with Zosyn.   WBC trending down.  Rectal adenocarcinoma with liver metastases / Peritoneal carcinomatosis   Patient will be discharged home with hospice care 08/13/14.  Acute renal failure / Contrast induced nephropathy   Acute renal failure felt to be secondary to contrast induced nephropathy. Evaluated by nephrologist on admission.  Renal function back to baseline.   Hypochloremic hyponatremia / metabolic alkalosis / hypokalemia   Likely secondary to dehydration, GI losses.   Most recent BMET shows improved  mild hyponatremia/and resolution of hypochloremia.  Metabolic alkalosis and hypokalemia have resolved.  Anemia of chronic disease   Likely  due to history of malignancy. Hemoglobin stable, trending down over time. No current indications for transfusion.  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   Severe protein calorie malnutrition   TNA discontinued, on a full liquid diet.  DVT prophylaxis   On therapeutic anticoagulation with Lovenox.  Code Status: Full.  Family Communication: Mother and brother at bedside, asked to step out per patient request. Disposition Plan:  Home with hospice when stable.   IV Access / Drains   Peripheral IV  PICC line  PEG tube placed 08/04/2014   Procedures and diagnostic studies:   US Abdomen Complete 07/28/2014 1. Gallstones with 1 stone measuring 1.7 cm lodged within the gallbladder neck. Wall is thickened to 5.2 mm. Multiple echogenic foci project along the gallbladder wall. This could reflect a air. Findings support acute cholecystitis in the proper clinical setting. 2. Hypoechoic liver lesions which are nonspecific. They do not appear to be cysts. Largest lies in the right lobe measuring 15 mm. Liver shows a diffusely coarsened increased echotexture consistent with hepatic steatosis. 3. No bile duct dilation.  Abdominal X ray 07/29/2014 - abdominal distention likely due to gaseous distention in stomach. Consider NG tube for decompression.  Dg Abd Portable 1v 07/31/2014 Interval decompression of the stomach with NG tube. Persistent gaseous distention of multiple loops of small bowel.  TNA 08/02/2014  Dg Abd 2 Views 08/04/2014 Enteric tube terminates in the distal gastric antrum/proximal duodenum. Dilated loops of small bowel with air-fluid levels in the left mid abdomen, suggesting partial small bowel obstruction or small bowel enteritis. No free air.  DIAGNOSTIC LAPAROSCOPY 08/04/2014; LAPAROSCOPIC LYSIS OF ADHESIONS; PERITONEAL BIOPSY OF CARCINOMATOSIS; LAPAROSCOPIC PLACEMENT OF GASTROSTOMY TUBE   Medical Consultants:   Oncology (Dr. Betsy Coder)  Nephrology  (Dr. Roney Jaffe)  Surgery (Dr. Leighton Ruff and Dr. Michael Boston)   Other Consultants:   Nutrition   Anti-Infectives:   Zosyn 07/27/2014 --> 08/04/2014  HPI/Subjective: Victoria [name] refused to ambulate with PT yesterday, is sitting up in the chair today but has not ambulated.  Has used.  She is taking only sips of liquids.  Objective: Filed Vitals:   08/11/14 0556 08/11/14 1415 08/11/14 2218 08/12/14 0708  BP: 112/70 113/60 124/70 121/74  Pulse: 101 107 103 103  Temp: 98 F (36.7 C) 98.4 F (36.9 C) 98.2 F (36.8 C) 98.4 F (36.9 C)  TempSrc: Oral Oral Oral Oral  Resp: 20 20 20 16   Height:      Weight:    73.1 kg (161 lb 2.5 oz)  SpO2: 96% 99% 93% 96%    Intake/Output Summary (Last 24 hours) at 08/12/14 6433 Last data filed at 08/12/14 0710  Gross per 24 hour  Intake   7280 ml  Output   7850 ml  Net   -570 ml    Exam:  Gen:  NAD Cardiovascular:  Tachycardic, No M/R/G Respiratory: Lungs CTAB Gastrointestinal: Abdomen soft, NT/ND with normal active bowel sounds. Extremities: No C/E/C  Data Reviewed: Basic Metabolic Panel:  Recent Labs Lab 08/05/14 0835 08/06/14 0520 08/08/14 0343 08/09/14 0645  NA 139 140 129* 135*  K 3.9 3.9 4.4 4.2  CL 98 100 93* 97  CO2 28 28 25 28   GLUCOSE 178* 168* 122* 137*  BUN 30* 34* 38* 30*  CREATININE 1.52* 1.30* 1.27* 1.07  CALCIUM 8.8 8.8  9.1 9.2  MG  --  2.1  --  1.9  PHOS  --  3.1  --  4.0   GFR Estimated Creatinine Clearance: 54.6 ml/min (by C-G formula based on Cr of 1.07). Liver Function Tests:  Recent Labs Lab 08/06/14 0520 08/09/14 0645  AST 20 24  ALT 21 22  ALKPHOS 73 113  BILITOT 0.4 0.7  PROT 6.4 6.4  ALBUMIN 2.1* 1.8*    CBC:  Recent Labs Lab 08/06/14 0520 08/08/14 0343 08/10/14  WBC 19.7* 14.1* 10.7*  NEUTROABS 18.5*  --   --   HGB 8.3* 7.6* 7.6*  HCT 25.3* 22.8* 22.5*  MCV 80.8 78.4 76.5*  PLT 183 239 316   CBG:  Recent Labs Lab 08/11/14 0753 08/11/14 1131 08/11/14 1718  08/11/14 2213 08/12/14 0758  GLUCAP 121* 105* 107* 112* 105*   Microbiology Recent Results (from the past 240 hour(s))  MRSA PCR SCREENING     Status: None   Collection Time    08/04/14 10:30 AM      Result Value Ref Range Status   MRSA by PCR NEGATIVE  NEGATIVE Final   Comment:            The GeneXpert MRSA Assay (FDA     approved for NASAL specimens     only), is one component of a     comprehensive MRSA colonization     surveillance program. It is not     intended to diagnose MRSA     infection nor to guide or     monitor treatment for     MRSA infections.     Scheduled Meds: . enoxaparin (LOVENOX) injection  100 mg Subcutaneous Q24H  . HYDROmorphone PCA 0.3 mg/mL   Intravenous 6 times per day  . insulin aspart  0-15 Units Subcutaneous TID WC   Continuous Infusions: . 0.9 % NaCl with KCl 40 mEq / L 10 mL/hr (08/11/14 1447)    Time spent: 25 minutes.   LOS: 16 days   [facility] Pager [phone]. If unable to reach me by pager, please call my cell phone at [phone].  *Please refer to [email or web address], password [email or web address] to get updated schedule on who will round on this patient, as hospitalists switch teams weekly. If 7PM-7AM, please contact night-coverage at [email or web address], password [email or web address] for any overnight needs.  08/12/2014, 8:12 AM

## 2014-08-12 NOTE — Progress Notes (Signed)
Note for [facility]: Patient's husband requesting Hospital bed and transfer bench. Jonnie Finner RN CCM Case Mgmt phone [phone]

## 2014-08-13 ENCOUNTER — Other Ambulatory Visit: Payer: Self-pay | Admitting: *Deleted

## 2014-08-13 DIAGNOSIS — N039 Chronic nephritic syndrome with unspecified morphologic changes: Secondary | ICD-10-CM

## 2014-08-13 DIAGNOSIS — D631 Anemia in chronic kidney disease: Secondary | ICD-10-CM

## 2014-08-13 DIAGNOSIS — D630 Anemia in neoplastic disease: Secondary | ICD-10-CM

## 2014-08-13 DIAGNOSIS — N189 Chronic kidney disease, unspecified: Secondary | ICD-10-CM

## 2014-08-13 LAB — BASIC METABOLIC PANEL
Anion gap: 12 (ref 5–15)
BUN: 46 mg/dL — ABNORMAL HIGH (ref 6–23)
CALCIUM: 8.8 mg/dL (ref 8.4–10.5)
CHLORIDE: 82 meq/L — AB (ref 96–112)
CO2: 42 meq/L — AB (ref 19–32)
Creatinine, Ser: 2.04 mg/dL — ABNORMAL HIGH (ref 0.50–1.10)
GFR calc Af Amer: 29 mL/min — ABNORMAL LOW (ref >90)
GFR calc non Af Amer: 25 mL/min — ABNORMAL LOW (ref >90)
Glucose, Bld: 126 mg/dL — ABNORMAL HIGH (ref 70–99)
Potassium: 3.1 mEq/L — ABNORMAL LOW (ref 3.7–5.3)
SODIUM: 136 meq/L — AB (ref 137–147)

## 2014-08-13 LAB — CBC
HCT: 21.1 % — ABNORMAL LOW (ref 36.0–46.0)
HEMOGLOBIN: 7.2 g/dL — AB (ref 12.0–15.0)
MCH: 25.9 pg — ABNORMAL LOW (ref 26.0–34.0)
MCHC: 34.1 g/dL (ref 30.0–36.0)
MCV: 75.9 fL — ABNORMAL LOW (ref 78.0–100.0)
Platelets: 344 10*3/uL (ref 150–400)
RBC: 2.78 MIL/uL — ABNORMAL LOW (ref 3.87–5.11)
RDW: 16.6 % — AB (ref 11.5–15.5)
WBC: 10 10*3/uL (ref 4.0–10.5)

## 2014-08-13 MED ORDER — FENTANYL 75 MCG/HR TD PT72: 75.0000 ug | MEDICATED_PATCH | TRANSDERMAL | Status: AC

## 2014-08-13 MED ORDER — SODIUM CHLORIDE 0.9 % IJ SOLN: 10.0000 mL | INTRAMUSCULAR | Status: DC | PRN

## 2014-08-13 MED ORDER — MORPHINE SULFATE (CONCENTRATE) 10 MG /0.5 ML PO SOLN: 20.0000 mg | ORAL | Status: AC

## 2014-08-13 MED ORDER — HEPARIN SOD (PORK) LOCK FLUSH 10 UNIT/ML IV SOLN
10.0000 [IU] | Freq: Once | INTRAVENOUS | Status: DC
  Filled 2014-08-13: qty 1

## 2014-08-13 MED ORDER — FENTANYL 75 MCG/HR TD PT72: 75.0000 ug | MEDICATED_PATCH | TRANSDERMAL | Status: DC

## 2014-08-13 MED ORDER — HEPARIN SOD (PORK) LOCK FLUSH 100 UNIT/ML IV SOLN: 250.0000 [IU] | INTRAVENOUS | Status: DC | PRN

## 2014-08-13 MED ORDER — POTASSIUM CHLORIDE 10 MEQ/50ML IV SOLN
10.0000 meq | INTRAVENOUS | Status: AC
  Administered 2014-08-13 (×4): 10 meq via INTRAVENOUS
  Filled 2014-08-13 (×4): qty 50

## 2014-08-13 MED ORDER — ENOXAPARIN SODIUM 80 MG/0.8ML ~~LOC~~ SOLN
1.0000 mg/kg | SUBCUTANEOUS | Status: DC
  Administered 2014-08-13: 70 mg via SUBCUTANEOUS
  Filled 2014-08-13: qty 0.8

## 2014-08-13 NOTE — Progress Notes (Signed)
ANTICOAGULATION CONSULT NOTE - Follow up  Pharmacy Consult for Lovenox Indication: History of DVT  Allergies  Allergen Reactions  . Ointment Base [Lanolin-Petrolatum] Rash    Specifically burn ointments.    Patient Measurements: Height: 5\' 5"  (165.1 cm) Weight: 152 lb 12.5 oz (69.3 kg) IBW/kg (Calculated) : 57  Vital Signs: Temp: 97.9 F (36.6 C) (09/14 0536) Temp src: Oral (09/14 0536) BP: 121/70 mmHg (09/14 0536) Pulse Rate: 113 (09/14 0536)  Labs:  Recent Labs  08/13/14 0535  HGB 7.2*  HCT 21.1*  PLT 344  CREATININE 2.04*    Estimated Creatinine Clearance: 27.9 ml/min (by C-G formula based on Cr of 2.04).   Medical History: Past Medical History  Diagnosis Date  . Medical history non-contributory   . HDL deficiency   . Allergy     seasonal allergic rhinitis  . History of radiation therapy 10/16/13-11/27/13    rectal 50.4Gy  . Peripheral vascular disease 4/15    DVT LEFT LEG  . Cancer 09/19/13    rectum    Assessment: Pt with h/o rectal cancer and now with liver mets was admitted from [facility] on 8/28 for FTT and dehydration. Pt was on Lovenox 100mg  SQ Q24h PTA for L leg DVT diagnosed in 03/15/14 with last dose 8/27@1730 .  Noted to be in AKI at admission and lovenox dosed on anti-Xa levels.  Renal function resolved to baseline on 9/3.  Also, patient has prolonged ileus/pSBO on TPN, s/p lap LOA and placement of PEG on 9/5.  Pharmacy is consulted to continue enoxaparin while inpatient   Hgb stable at 7.2  Platelets ok  SCr worsened from 1.07 to 2.04. CrCl 46ml/min.  Significant Events: 9/2: SCr improved to approximately baseline 9/4: Lovenox held in anticipation of surgery on 9/5 9/5: surgery performed: diagnostic laproscopy, laproscopic lysis of adhesions, peritoneal biopsy of carcinomatosis, laproscopic placement of gastrostomy tube 9/6: Lovenox resumed 9/12: patient to be discharged with home hospice once pain is adequately controlled  Goal of  Therapy:  Appropriate anticoagulation for patient with hx of DVT Monitor PLT with anticoagulation protocol: Yes   Plan:   Reduce Lovenox to 1mg /kg SQ q24h d/t CrCl <3ml/min.  Follow renal function, CBC   Romeo Rabon, PharmD, pager [phone]. 08/13/2014,7:52 AM.

## 2014-08-13 NOTE — Progress Notes (Signed)
IP PROGRESS NOTE  Subjective: She reports adequate pain control with the Duragesic patch and Roxanol. She plans to go home with Hospice care.  Objective: Vital signs in last 24 hours: Temp:  [97.9 F (36.6 C)-99.8 F (37.7 C)] 97.9 F (36.6 C) (09/14 0536) Pulse Rate:  [95-123] 113 (09/14 0536) Resp:  [16-20] 16 (09/14 0536) BP: (121-131)/(66-74) 121/70 mmHg (09/14 0536) SpO2:  [91 %-97 %] 91 % (09/14 0536) Weight:  [152 lb 12.5 oz (69.3 kg)] 152 lb 12.5 oz (69.3 kg) (09/14 0536)  Intake/Output from previous day: 09/13 0701 - 09/14 0700 In: 14637.8 [P.O.:14320; I.V.:317.8] Out: 17494 [Urine:1100; Drains:12175] Intake/Output this shift:    Abdomen: distended-left upper quadrant gastrostomy tube, diffuse tenderness Right arm PICC without erythema  Lab Results:   Recent Labs  08/13/14 0535  WBC 10.0  HGB 7.2*  HCT 21.1*  PLT 344   BMET  Recent Labs  08/13/14 0535  NA 136*  K 3.1*  CL 82*  CO2 42*  GLUCOSE 126*  BUN 46*  CREATININE 2.04*  CALCIUM 8.8    Studies/Results: No results found.    Assessment/Plan: 1. 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  Initiation of radiation and concurrent Xeloda 10/16/2013.  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  Cycle 1 adjuvant Xeloda 02/05/2014.  Cycle 2 adjuvant Xeloda 02/26/2014.  Cycle 3 adjuvant Xeloda 03/18/2014.  Cycle 4 adjuvant Xeloda 04/08/2014.  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 Indeterminate right lung nodule on CT 09/29/2013. 3. History of weight loss.  4. Left leg DVT 03/15/2014. On Lovenox. 5. Admission for ileostomy reversal 06/07/2014. 6. Hospitalization 07/11/2014 through 07/14/2014 with a small bowel obstruction. Patient felt to most likely have a narrow area at her small bowel anastomosis due to  inflammation after surgery and not a true obstruction. 7. Elevated CEA 07/14/2014. 8. CT chest/abdomen/pelvis 07/18/2014 with a new small right pleural effusion; stable 5 mm posterior right upper lobe lung nodule; gallstones noted within the gallbladder; marked gallbladder wall thickening measuring up to 11 mm in thickness at the fundus; abnormal low density noted in the adjacent inferior liver with small scattered satellite nodules in the adjacent liver. Findings within the liver new since the prior study. Small amount of perihepatic fluid adjacent to the right hepatic lobe and gallbladder. Extensive edema/stranding within the pelvis extending from the presacral space anteriorly. New area of sclerosis right pubic bone. Abdominal ultrasound 07/28/2014 concerning for acute cholecystitis 9. Acute renal failure/hyponatremia-seen by nephrology, renal failure improved with hydration. BUN and creatinine now increased, likely dehydration 10. Bowel obstruction requiring NG tube placement-status post a diagnostic laparoscopy 08/04/2014 confirming extensive carcinomatosis, gastrostomy tube placed 11. Pain secondary to surgery and carcinomatosis 12. Anemia secondary to chronic disease, renal failure, and phlebotomy        LOS: 17 days    Victoria [name], ANP/GNP-BC 08/13/2014 8:47 AM  She appears comfortable this morning. I discussed the situation with Victoria [name] and her husband. She appears ready for discharge to home with Hospice care. The plan is to continue the Duragesic patch and Roxanol for pain. She would like to leave the PICC in place for now..  We again discussed the indication for continuing Lovenox. Mr. Birge indicated he plans to continue the Lovenox until the current prescription is completed.  She will plan to return for an office visit in one to 2 weeks, though  her husband acknowledged it may be difficult for her to make it to the office for an appointment. We also discussed the  possibility of [place] in the future.  Recommendations:  1. continue Duragesic and Roxanol for pain 2. home Hospice care 3. outpatient followup will be arranged at the [facility].

## 2014-08-13 NOTE — Discharge Instructions (Signed)
Gastrostomy Tube Home Guide  A gastrostomy tube is a tube that is surgically placed into the stomach. It is also called a "G-tube." G-tubes are used when a person is unable to eat and drink enough on their own to stay healthy. The tube is inserted into the stomach through a small cut (incision) in the skin. This tube is used for:   Feeding.   Giving medication.  GASTROSTOMY TUBE CARE   Wash your hands with soap and water.   Remove the old dressing (if any). Some styles of G-tubes may need a dressing inserted between the skin and the G-tube. Other types of G-tubes do not require a dressing. Ask your health care provider if a dressing is needed.   Check the area where the tube enters the skin (insertion site) for redness, swelling, or pus-like (purulent) drainage. A small amount of clear or tan liquid drainage is normal. Check to make sure scar tissue (skin) is not growing around the insertion site. This could have a raised, bumpy appearance.   A cotton swab can be used to clean the skin around the tube:   When the G-tube is first put in, a normal saline solution or water can be used to clean the skin.   Mild soap and warm water can be used when the skin around the G-tube site has healed.   Roll the cotton swab around the G-tube insertion site to remove any drainage or crusting at the insertion site.  STOMACH RESIDUALS  Feeding tube residuals are the amount of liquids that are in the stomach at any given time. Residuals may be checked before giving feedings, medications, or as instructed by your health care provider.   Ask your health care provider if there are instances when you would not start tube feedings depending on the amount or type of contents withdrawn from the stomach.   Check residuals by attaching a syringe to the G-tube and pulling back on the syringe plunger. Note the amount, and return the residual back into the stomach.  FLUSHING THE G-TUBE   The G-tube should be periodically flushed with  clean warm water to keep it from clogging.   Flush the G-tube after feedings or medications. Draw up 30 mL of warm water in a syringe. Connect the syringe to the G-tube and slowly push the water into the tube.   Do not push feedings, medications, or flushes rapidly. Flush the G-tube gently and slowly.   Only use syringes made for G-tubes to flush medications or feedings.   Your health care provider may want the G-tube flushed more often or with more water. If this is the case, follow your health care provider's instructions.  FEEDINGS  Your health care provider will determine whether feedings are given as a bolus (a certain amount given at one time and at scheduled times) or whether feedings will be given continuously on a feeding pump.    Formulas should be given at room temperature.   If feedings are continuous, no more than 4 hours worth of feedings should be placed in the feeding bag. This helps prevent spoilage or accidental excess infusion.   Cover and place unused formula in the refrigerator.   If feedings are continuous, stop the feedings when medications or flushes are given. Be sure to restart the feedings.   Feeding bags and syringes should be replaced as instructed by your health care provider.  GIVING MEDICATION    In general, it is best if all medications   are in a liquid form for G-tube administration. Liquid medications are less likely to clog the G-tube.   Mix the liquid medication with 30 mL (or amount recommended by your health care provider) of warm water.   Draw up the medication into the syringe.   Attach the syringe to the G-tube and slowly push the mixture into the G-tube.   After giving the medication, draw up 30 mL of warm water in the syringe and slowly flush the G-tube.   For pills or capsules, check with your health care provider first before crushing medications. Some pills are not effective if they are crushed. Some capsules are sustained-release medications.   If  appropriate, crush the pill or capsule and mix with 30 mL of warm water. Using the syringe, slowly push the medication through the tube, then flush the tube with another 30 mL of tap water.  G-TUBE PROBLEMS  G-tube was pulled out.   Cause: May have been pulled out accidentally.   Solutions: Cover the opening with clean dressing and tape. Call your health care provider right away. The G-tube should be put in as soon as possible (within 4 hours) so the G-tube opening (tract) does not close. The G-tube needs to be put in at a health care setting. An X-ray needs to be done to confirm placement before the G-tube can be used again.  Redness, irritation, soreness, or foul odor around the gastrostomy site.   Cause: May be caused by leakage or infection.   Solutions: Call your health care provider right away.  Large amount of leakage of fluid or mucus-like liquid present (a large amount means it soaks clothing).   Cause: Many reasons could cause the G-tube to leak.   Solutions: Call your health care provider to discuss the amount of leakage.  Skin or scar tissue appears to be growing where tube enters skin.    Cause: Tissue growth may develop around the insertion site if the G-tube is moved or pulled on excessively.   Solutions: Secure tube with tape so that excess movement does not occur. Call your health care provider.  G-tube is clogged.   Cause: Thick formula or medication.   Solutions: Try to slowly push warm water into the tube with a large syringe. Never try to push any object into the tube to unclog it. Do not force fluid into the G-tube. If you are unable to unclog the tube, call your health care provider right away.  TIPS   Head of bed (HOB) position refers to the upright position of a person's upper body.   When giving medications or a feeding bolus, keep the HOB up as told by your health care provider. Do this during the feeding and for 1 hour after the feeding or medication administration.   If  continuous feedings are being given, it is best to keep the HOB up as told by your health care provider. When ADLs (activities of daily living) are performed and the HOB needs to be flat, be sure to turn the feeding pump off. Restart the feeding pump when the HOB is returned to the recommended height.   Do not pull or put tension on the tube.   To prevent fluid backflow, kink the G-tube before removing the cap or disconnecting a syringe.   Check the G-tube length every day. Measure from the insertion site to the end of the G-tube. If the length is longer than previous measurements, the tube may be coming out. Call   your health care provider if you notice increasing G-tube length.   Oral care, such as brushing teeth, must be continued.   You may need to remove excess air (vent) from the G-tube. Your health care provider will tell you if this is needed.   Always call your health care provider if you have questions or problems with the G-tube.  SEEK IMMEDIATE MEDICAL CARE IF:    You have severe abdominal pain, tenderness, or abdominal bloating (distension).   You have nausea or vomiting.   You are constipated or have problems moving your bowels.   The G-tube insertion site is red, swollen, has a foul smell, or has yellow or brown drainage.   You have difficulty breathing or shortness of breath.   You have a fever.   You have a large amount of feeding tube residuals.   The G-tube is clogged and cannot be flushed.  MAKE SURE YOU:    Understand these instructions.   Will watch your condition.   Will get help right away if you are not doing well or get worse.  Document Released: 01/25/2002 Document Revised: 04/02/2014 Document Reviewed: 07/24/2013  [employer] Patient Information 2015 [employer]. This information is not intended to replace advice given to you by your health care provider. Make sure you discuss any questions you have with your health care provider.

## 2014-08-13 NOTE — Progress Notes (Signed)
CRITICAL VALUE ALERT  Critical value received:  Co2: 42  Date of notification : 08/13/14  Time of notification:  0625  Critical value read back:Yes  Nurse who received alert:  Azzie Glatter, RN MD notified (1st page):  [place]  Time of first page:  0630  MD notified (2nd page): n/a  Time of second page: n/a  Responding MD: MD paged w/ message regarding critical value.  Time MD responded: n/a

## 2014-08-13 NOTE — Discharge Summary (Signed)
Physician Discharge Summary  GEOFFREY MANKIN BWG:[id number] DOB: [birth year] DOA: 07/27/2014  PCP: Irven Shelling, MD  Admit date: 07/27/2014 Discharge date: 08/13/2014   Recommendations for Outpatient Follow-Up:   1. The patient is being sent home with hospice care. DME including a hospital bed and transport wheelchair ordered. 2. Dr. Benay Spice will be hospice attending MD.  Recommend addressing CODE STATUS. 3. D/C'd home with PICC line in place, foley in place. Can clamp PEG PRN, drain to gravity PRN N/V.   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  Diet recommendation: Low sodium, heart healthy.  Carbohydrate-modified.  Regular.   History of Present Illness:   63 year old female with history of rectal adenocarcinoma, status post radiation and chemotherapy, status post resection and diverting ileostomy in 12/2013, ileostomy reversal on 06/07/2014, history of DVT on anticoagulation with Lovenox, recent hospitalization for small bowel obstruction 07/11/14 - 07/14/2014 who presented from cancer center with nausea and vomiting, failure to thrive. Abdominal US showed possible acute cholecystitis for which she was started on zosyn. In addition, she was found to have severe hyponatremia of 107 but it has improved with IV normal saline. Her creatinine was 4.62 on admission which was thought to be reflective of recent CT abdomen with contrast she had 07/18/2014. Her hospital course is  complicated by ongoing small bowel obstruction. She underwent diagnostic laparoscopy on 08/04/2014 with palliative G tube for decompression. Her hospital course was complicated by problems with pain control.  Hospital Course by Problem:   Principal Problem:  Abdominal distention / Small bowel malignant obstruction / Peritoneal carcinomatosis   Initial abd x ray showed gaseous distention and NG tube was placed for decompression. Patient continued to have quite a significant output through NG tube. She underwent diagnostic laparoscopy with lysis of adhesions, peritoneal biopsy of carcinomatosis and laparoscopic placement of PEG 08/04/2014. Has tolerated limited clamping of the PEG.   Diet advanced to full liquids, which she can continue at discharge. TNA discontinued 08/09/14.   Oncologist has recommended hospice care at discharge.  Pain management with PCA dilaudid discontinued 08/08/14. She will need aggressive titration of her pain medications as she has required high doses of Roxanol while in the hospital as well as IV doses of Dilaudid-HP breakthrough pain. At discharge, she is on a 75 mcg fentanyl patch and scheduled Roxanol 20 mg every 2 hours. . Active Problems:  Family dynamics problem   The patient appears to be afraid of expressing her wishes to her husband, who appears to be very controlling and in particular, wants her to go home with as little equipment as possible. He pushes her to take in large amounts of oral liquids, and the nursing staff feels that this is inappropriate given her malignant obstruction. He wants them to clamp her PEG tube despite the fact that she cannot tolerate this. He also reversed the patient's decision to go home on a continuous Dilaudid-HP infusion for pain control.  The patient also does not want her medical condition discussed in front of her mother, who appears to be actively grieving for her daughter.   The patient's brother is supportive and visits  regularly.  Acute cholecystitis / Leukocytosis   Acute cholecystitis noted on abdominal US. Status post 9 days of treatment with Zosyn.   WBC was near normal the last time it was checked.  Rectal adenocarcinoma with liver metastases / Peritoneal carcinomatosis   Patient will be discharged home with hospice care.  Acute renal failure / Contrast induced nephropathy   Acute renal failure felt to be secondary to contrast induced nephropathy. Evaluated by nephrologist on admission.   Renal function improved, but creatinine beginning to rise at discharge, likely from progressive organ failure related to advanced malignancy.  Would not check further labs.   Hypochloremic hyponatremia / metabolic alkalosis / hypokalemia   Likely secondary to dehydration, GI losses.   Most recent BMET shows improved mild hyponatremia/and resolution of hypochloremia.   Metabolic alkalosis likely from hypoventilation/CO2 retention.  Was given 4 runs of potassium prior to discharge.  Anemia of chronic disease   Likely due to history of malignancy. Hemoglobin stable, trending down over time. No current indications for transfusion.  History of DVT, LLE 02/2014   Resumed therapeutic dose Lovenox after surgery, which she will continue post discharge.   Severe protein calorie malnutrition  TNA discontinued 08/08/14, tolerating a full liquid diet.   Medical Consultants:    Oncology (Dr. Betsy Coder)   Nephrology (Dr. Roney Jaffe)   Surgery (Dr. Leighton Ruff and Dr. Michael Boston)   Discharge Exam:   Filed Vitals:   08/13/14 0536  BP: 121/70  Pulse: 113  Temp: 97.9 F (36.6 C)  Resp: 16   Filed Vitals:   08/12/14 0708 08/12/14 1422 08/12/14 2100 08/13/14 0536  BP: 121/74 131/74 123/66 121/70  Pulse: 103 95 123 113  Temp: 98.4 F (36.9 C) 98.3 F (36.8 C) 99.8 F (37.7 C) 97.9 F (36.6 C)  TempSrc: Oral Oral Oral Oral  Resp: 16 18 20 16   Height:      Weight: 73.1 kg (161 lb 2.5  oz)   69.3 kg (152 lb 12.5 oz)  SpO2: 96% 97% 92% 91%    Gen:  NAD Cardiovascular:  RRR, No M/R/G Respiratory: Lungs CTAB Gastrointestinal: Abdomen soft, NT/ND with normal active bowel sounds. Extremities: No C/E/C   The results of significant diagnostics from this hospitalization (including imaging, microbiology, ancillary and laboratory) are listed below for reference.     Procedures and Diagnostic Studies:   US Abdomen Complete 07/28/2014 1. Gallstones with 1 stone measuring 1.7 cm lodged within the gallbladder neck. Wall is thickened to 5.2 mm. Multiple echogenic foci project along the gallbladder wall. This could reflect a air. Findings support acute cholecystitis in the proper clinical setting. 2. Hypoechoic liver lesions which are nonspecific. They do not appear to be cysts. Largest lies in the right lobe measuring 15 mm. Liver shows a diffusely coarsened increased echotexture consistent with hepatic steatosis. 3. No bile duct dilation.   Abdominal X ray 07/29/2014 - abdominal distention likely due to gaseous distention in stomach. Consider NG tube for decompression.   Dg Abd Portable 1v 07/31/2014 Interval decompression of the stomach with NG tube. Persistent gaseous distention of multiple loops of small bowel.   TNA 08/02/2014-08/08/14   Dg Abd 2 Views 08/04/2014 Enteric tube terminates in the distal gastric antrum/proximal duodenum. Dilated loops of small bowel with air-fluid levels in  the left mid abdomen, suggesting partial small bowel obstruction or small bowel enteritis. No free air.   DIAGNOSTIC LAPAROSCOPY 08/04/2014; LAPAROSCOPIC LYSIS OF ADHESIONS; PERITONEAL BIOPSY OF CARCINOMATOSIS; LAPAROSCOPIC PLACEMENT OF GASTROSTOMY TUBE    Labs:   Basic Metabolic Panel:  Recent Labs Lab 08/08/14 0343 08/09/14 0645 08/13/14 0535  NA 129* 135* 136*  K 4.4 4.2 3.1*  CL 93* 97 82*  CO2 25 28 42*  GLUCOSE 122* 137* 126*  BUN 38* 30* 46*  CREATININE 1.27* 1.07 2.04*  CALCIUM 9.1  9.2 8.8  MG  --  1.9  --   PHOS  --  4.0  --    GFR Estimated Creatinine Clearance: 27.9 ml/min (by C-G formula based on Cr of 2.04). Liver Function Tests:  Recent Labs Lab 08/09/14 0645  AST 24  ALT 22  ALKPHOS 113  BILITOT 0.7  PROT 6.4  ALBUMIN 1.8*   CBC:  Recent Labs Lab 08/08/14 0343 08/10/14 08/13/14 0535  WBC 14.1* 10.7* 10.0  HGB 7.6* 7.6* 7.2*  HCT 22.8* 22.5* 21.1*  MCV 78.4 76.5* 75.9*  PLT 239 316 344   CBG:  Recent Labs Lab 08/11/14 1131 08/11/14 1718 08/11/14 2213 08/12/14 0758 08/12/14 1154  GLUCAP 105* 107* 112* 105* 100*   Microbiology Recent Results (from the past 240 hour(s))  MRSA PCR SCREENING     Status: None   Collection Time    08/04/14 10:30 AM      Result Value Ref Range Status   MRSA by PCR NEGATIVE  NEGATIVE Final   Comment:            The GeneXpert MRSA Assay (FDA     approved for NASAL specimens     only), is one component of a     comprehensive MRSA colonization     surveillance program. It is not     intended to diagnose MRSA     infection nor to guide or     monitor treatment for     MRSA infections.     Discharge Instructions:   Discharge Instructions   Call MD for:  persistant nausea and vomiting    Complete by:  As directed      Call MD for:  severe uncontrolled pain    Complete by:  As directed      Diet general    Complete by:  As directed      Discharge instructions    Complete by:  As directed   If you have any questions about your discharge medications or the care you received while you were in the hospital after you are discharged, you can call the unit and ask to speak with the hospitalist on call if the hospitalist that took care of you is not available. Once you are discharged, your primary care physician will handle any further medical issues. Please note that NO REFILLS for any discharge medications will be authorized once you are discharged, as it is imperative that you return to your primary care  physician (or establish a relationship with a primary care physician if you do not have one) for your aftercare needs so that they can reassess your need for medications and monitor your lab values.  Any outstanding tests can be reviewed by your PCP at your follow up visit.  It is also important to review any medicine changes with your PCP.  Please bring these d/c instructions with you to your next visit so your physician can review these changes  with you.     Increase activity slowly    Complete by:  As directed      Walk with assistance    Complete by:  As directed      Walker     Complete by:  As directed             Medication List         cycloSPORINE 0.05 % ophthalmic emulsion  Commonly known as:  RESTASIS  Place 1 drop into both eyes 2 (two) times daily.     enoxaparin 100 MG/ML injection  Commonly known as:  LOVENOX  Inject 100 mg into the skin daily.     fentaNYL 75 MCG/HR  Commonly known as:  DURAGESIC - dosed mcg/hr  Place 1 patch (75 mcg total) onto the skin every 3 (three) days.     Ipratropium-Albuterol 20-100 MCG/ACT Aers respimat  Commonly known as:  COMBIVENT  Inhale 1 puff into the lungs every 6 (six) hours as needed for wheezing.     lactose free nutrition Liqd  Take 237 mLs by mouth 3 (three) times daily between meals.     MILK OF MAGNESIA 400 MG/5ML suspension  Generic drug:  magnesium hydroxide  Take 30 mLs by mouth daily as needed for mild constipation.     morphine CONCENTRATE 10 mg / 0.5 ml concentrated solution  Take 1 mL (20 mg total) by mouth every 2 (two) hours.     morphine CONCENTRATE 10 mg / 0.5 ml concentrated solution  Take 1 mL (20 mg total) by mouth every 2 (two) hours.     potassium chloride SA 20 MEQ tablet  Commonly known as:  K-DUR,KLOR-CON  Take 2 tablets (40 mEq total) by mouth daily.     TRUNATURE DIGESTIVE PROBIOTIC PO  Take 1 capsule by mouth daily.           Follow-up Information   Follow up with Betsy Coder, MD.  (At your scheduled appt time noted below)    Specialty:  Oncology   Contact information:   [place] [address] [phone]        Time coordinating discharge: 40 minutes.  Signed:  Jamielynn Wigley  Pager [phone] [facility] 08/13/2014, 1:47 PM

## 2014-08-13 NOTE — Progress Notes (Signed)
Follow up visit to confirm discharge plans- Spoke with pt and her husband Victoria [name], who would like a hospital bed and transport wheel chair delivered today, asap. Writer contacted [facility] DME manager and equipment has been ordered for delivery asap. [facility] to contact James at [phone] for delivery.  Another family member, Milbert Coulter, was present in the room and stated he would be the "back up" number for [facility] to contact [phone]. Victoria [name] stated he is "still waiting for PT to come by and get Victoria [name] up" before she goes home today. He also felt that she may need to wait until tomorrow. He expressed some doubt about being able to care for Victoria [name] at home. Victoria [name] stated "it is too late for that decision".  Pt remains with foley catheter in place, writer asked if pt was to d/c with it, pt's husband stated he felt that until she could "get up and around" this would be the best plan. Writer explained that foley can be d/cd at home by the hospice  RN. Pain is being managed currently with Fentanyl patch 64mcg and liquid morphine scheduled 20mg  every 2 hrs. Pt did  recv 4 doses of IV dilaudid yesterday in addition to her scheduled liquid morphine. Pt drowsy, but quietly interactive with Probation officer. Victoria [name] requested a Patent examiner, Clinical biochemist notified by staff RN Joelene Millin. Chaplain completed Living with and HCPOA with patient. Pt's husband plans to take pt home via [facility].  [facility] referral center made aware of planned d/c today. Pt will need Rx for liquid morphine 20mg /ml, 20mg  Q 2 hrs, disp 66ml. Discharge summary will need to reflect PICC to be flushed and capped at d/c with discharge care per hospice protocol.  Flo Shanks, RN BSN, [facility]) [facility] Liaison [phone])

## 2014-08-14 ENCOUNTER — Telehealth: Payer: Self-pay | Admitting: Nurse Practitioner

## 2014-08-15 ENCOUNTER — Telehealth: Payer: Self-pay | Admitting: *Deleted

## 2014-08-15 NOTE — Telephone Encounter (Signed)
Per patient's husband; patient passed away last evening 08/23/14.  Dr. Benay Spice notified.

## 2014-08-23 ENCOUNTER — Ambulatory Visit: Payer: BC Managed Care – PPO | Admitting: Nurse Practitioner

## 2014-08-30 NOTE — Telephone Encounter (Signed)
Per pof to sch pt appt 9/24 w.LT-per pt spouse Victoria [name] pt is in Hospice and Dr Benay Spice is aware and seen this pt in the hospital and not sure why appt is being sch. Appt is sch per pof and spouse states not sure if pt will be at this appt.

## 2014-08-30 DEATH — deceased

## 2014-09-18 ENCOUNTER — Ambulatory Visit (INDEPENDENT_AMBULATORY_CARE_PROVIDER_SITE_OTHER): Payer: BC Managed Care – PPO | Admitting: General Surgery

## 2014-09-20 ENCOUNTER — Other Ambulatory Visit: Payer: BC Managed Care – PPO

## 2014-09-20 ENCOUNTER — Ambulatory Visit: Payer: BC Managed Care – PPO | Admitting: Oncology

## 2016-07-14 IMAGING — US US ABDOMEN COMPLETE
1 series · 13 of 25 positions shown · non-contrast
Comparison: CT, 07/18/2014.

CLINICAL DATA: Followup abnormal gallbladder on CT.  Liver lesions.

EXAM:
ULTRASOUND ABDOMEN COMPLETE

[Series 1: us abdomen complete · 0.24mm/px · 13 of 97 slices shown]
[im 1/97]
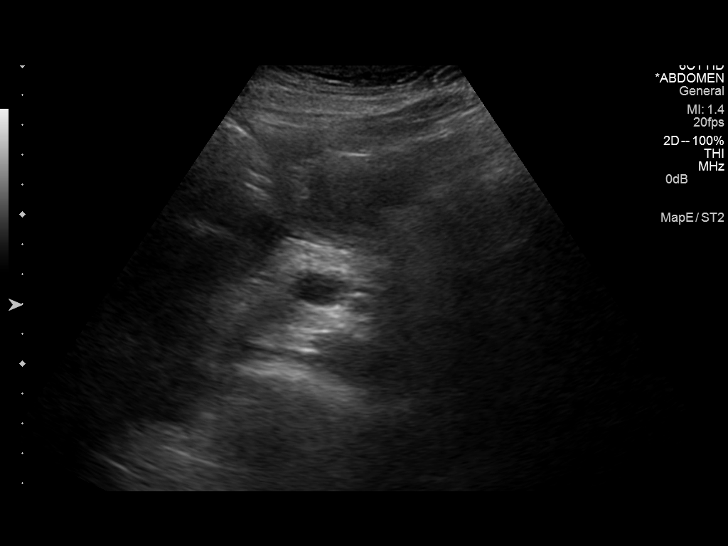
[im 9/97]
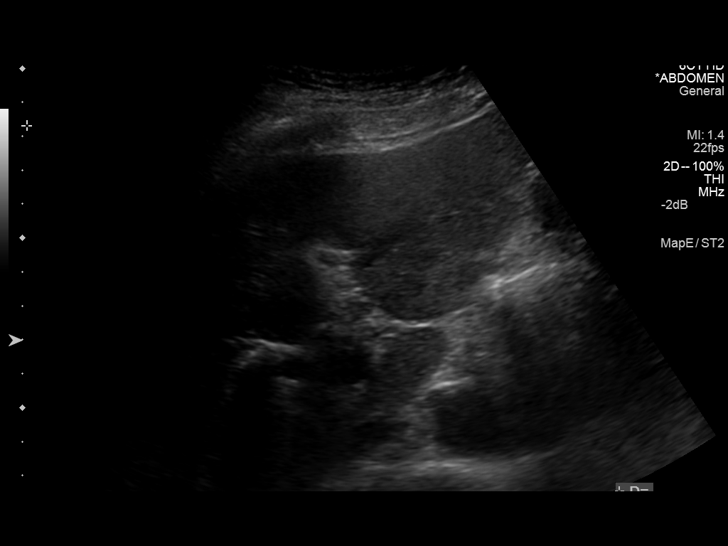
[im 17/97]
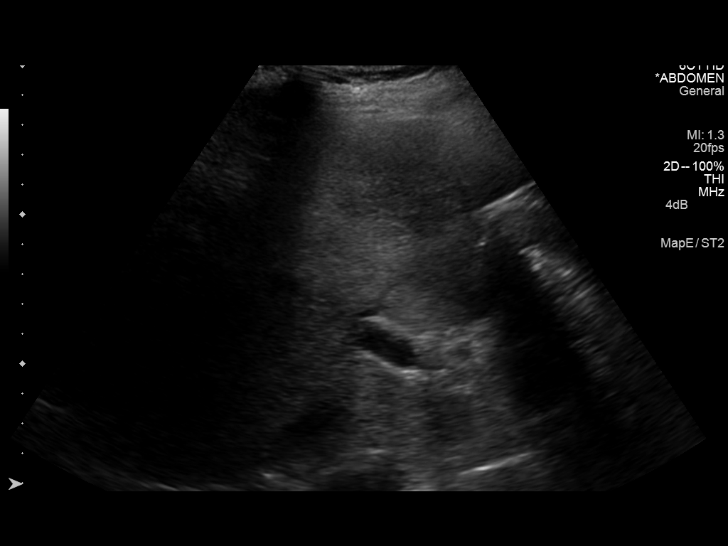
[im 25/97]
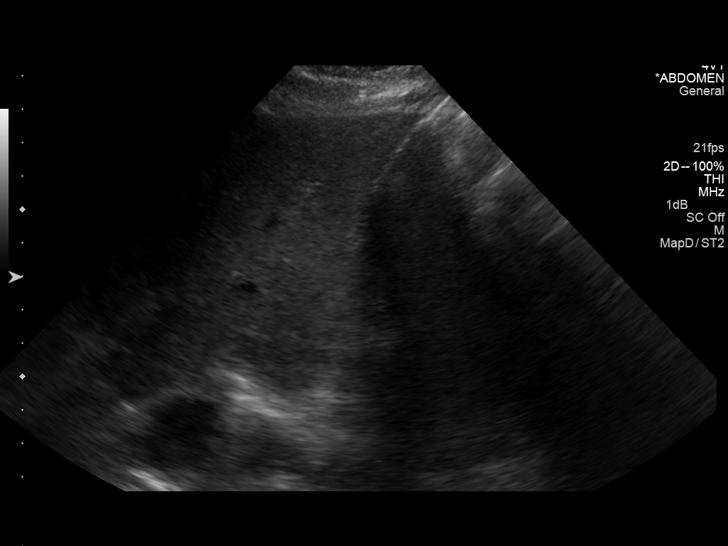
[im 33/97]
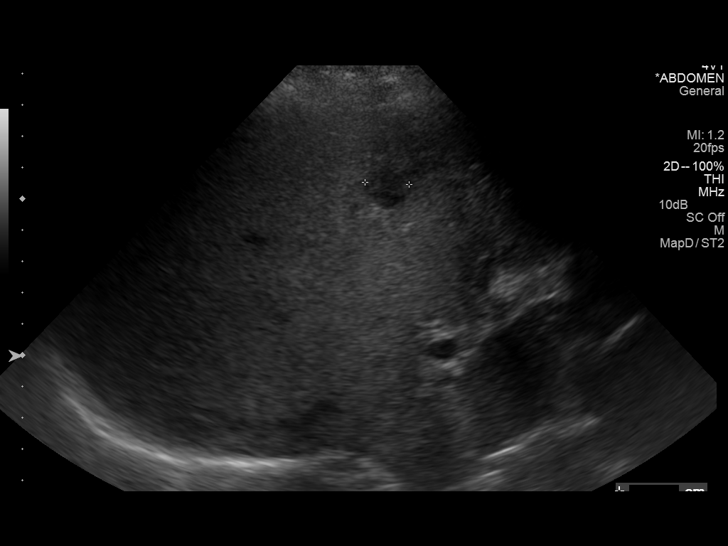
[im 41/97]
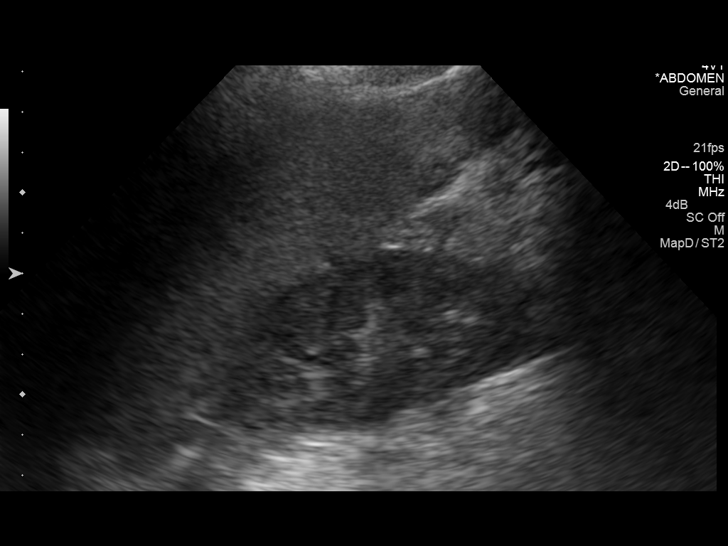
[im 49/97]
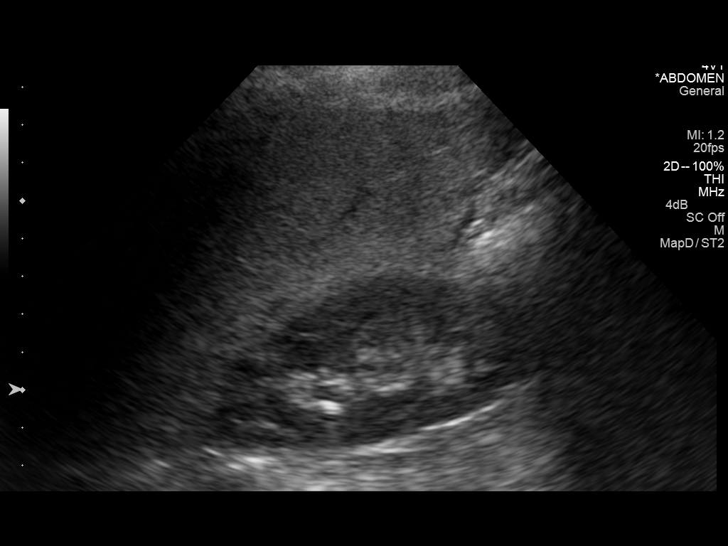
[im 57/97]
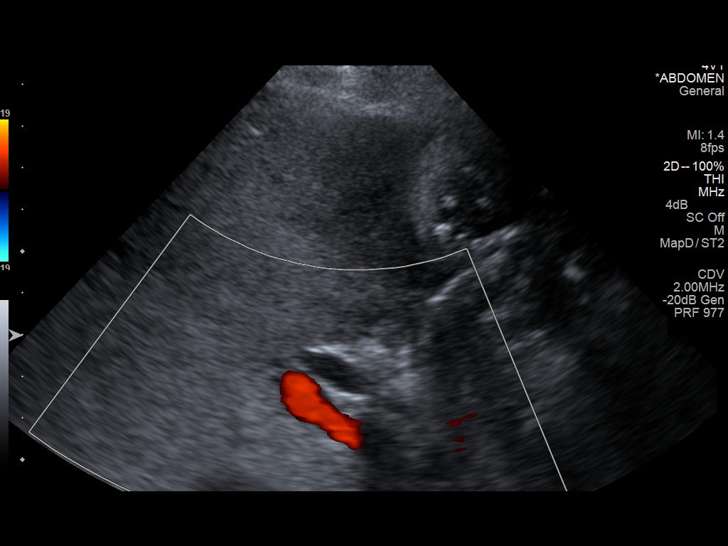
[im 65/97]
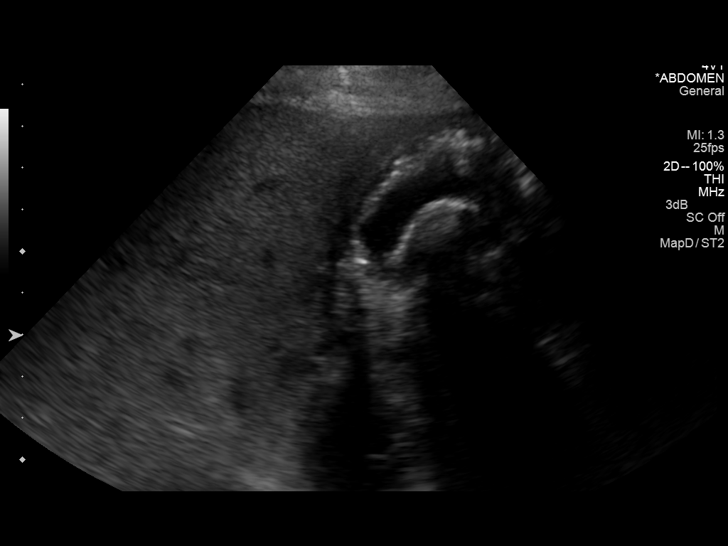
[im 73/97]
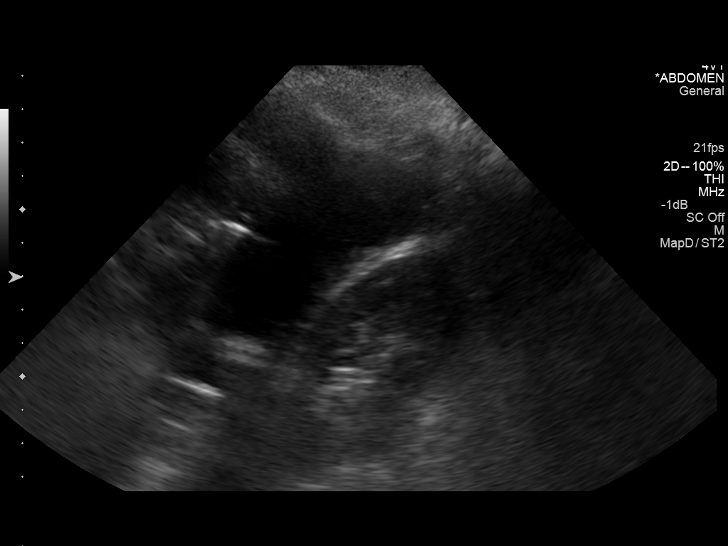
[im 81/97]
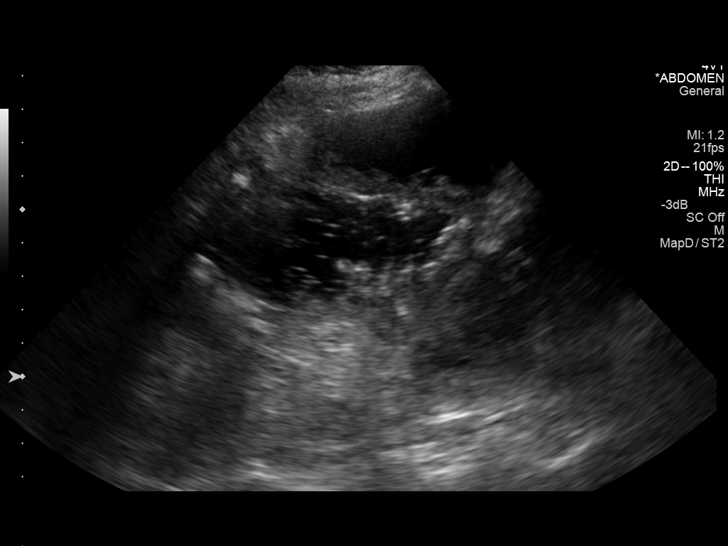
[im 89/97]
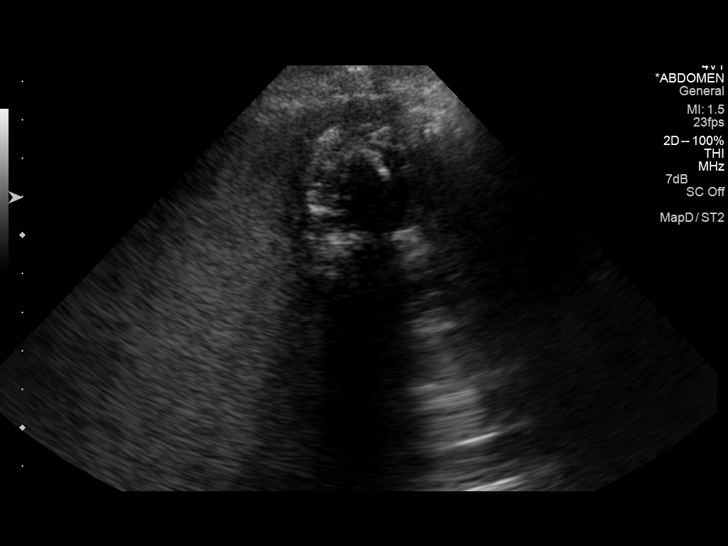
[im 97/97]
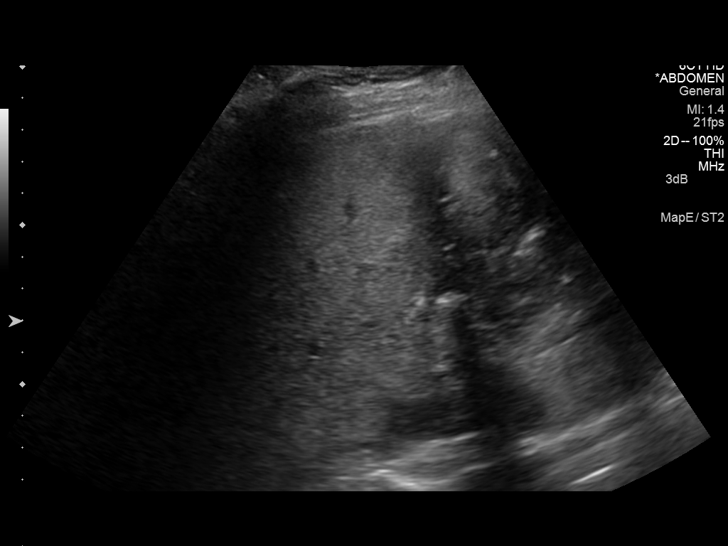

[13 of 25 positions shown; findings below may reference images not displayed]

FINDINGS: Gallbladder:

Large gallstone. Multiple echogenic foci along the gallbladder wall.
There is another gallstone in the gallbladder neck measuring 1.7 cm.
Wall is thickened to 5.2 mm.

Common bile duct:

Diameter: 6 mm.  No duct stone seen.

Liver:

Liver has a heterogeneous echotexture. There are several liver
lesions 14 mm hypoechoic lesion lies in the inferior right lobe. A
smaller lesion is noted in the inferior left lobe. Hepatopetal flow
documented in the portal vein.

IVC:

No abnormality visualized.

Pancreas:

Not well visualized.  Portions visualized are unremarkable.

Spleen:

Size and appearance within normal limits.

Right Kidney:

Length: 10 cm. Echogenicity within normal limits. No mass or
hydronephrosis visualized.

Left Kidney:

Length: 10 cm. Echogenicity within normal limits. No mass or
hydronephrosis visualized.

Abdominal aorta:

No aneurysm visualized.

Other findings:

None.
IMPRESSION: 1. Gallstones with 1 stone measuring 1.7 cm lodged within the
gallbladder neck. Wall is thickened to 5.2 mm. Multiple echogenic
foci project along the gallbladder wall. This could reflect a air.
Findings support acute cholecystitis in the proper clinical setting.
2. Hypoechoic liver lesions which are nonspecific. They do not
appear to be cysts. Largest lies in the right lobe measuring 15 mm.
Liver shows a diffusely coarsened increased echotexture consistent
with hepatic steatosis.
3. No bile duct dilation.

## 2016-07-17 IMAGING — DX DG ABD PORTABLE 1V
2 series · 2 of 2 positions shown · non-contrast
Comparison: Abdominal radiograph 07/29/2014

CLINICAL DATA: Abdominal distension

EXAM:
PORTABLE ABDOMEN - 1 VIEW

[abdomen kub (1 of 2)]
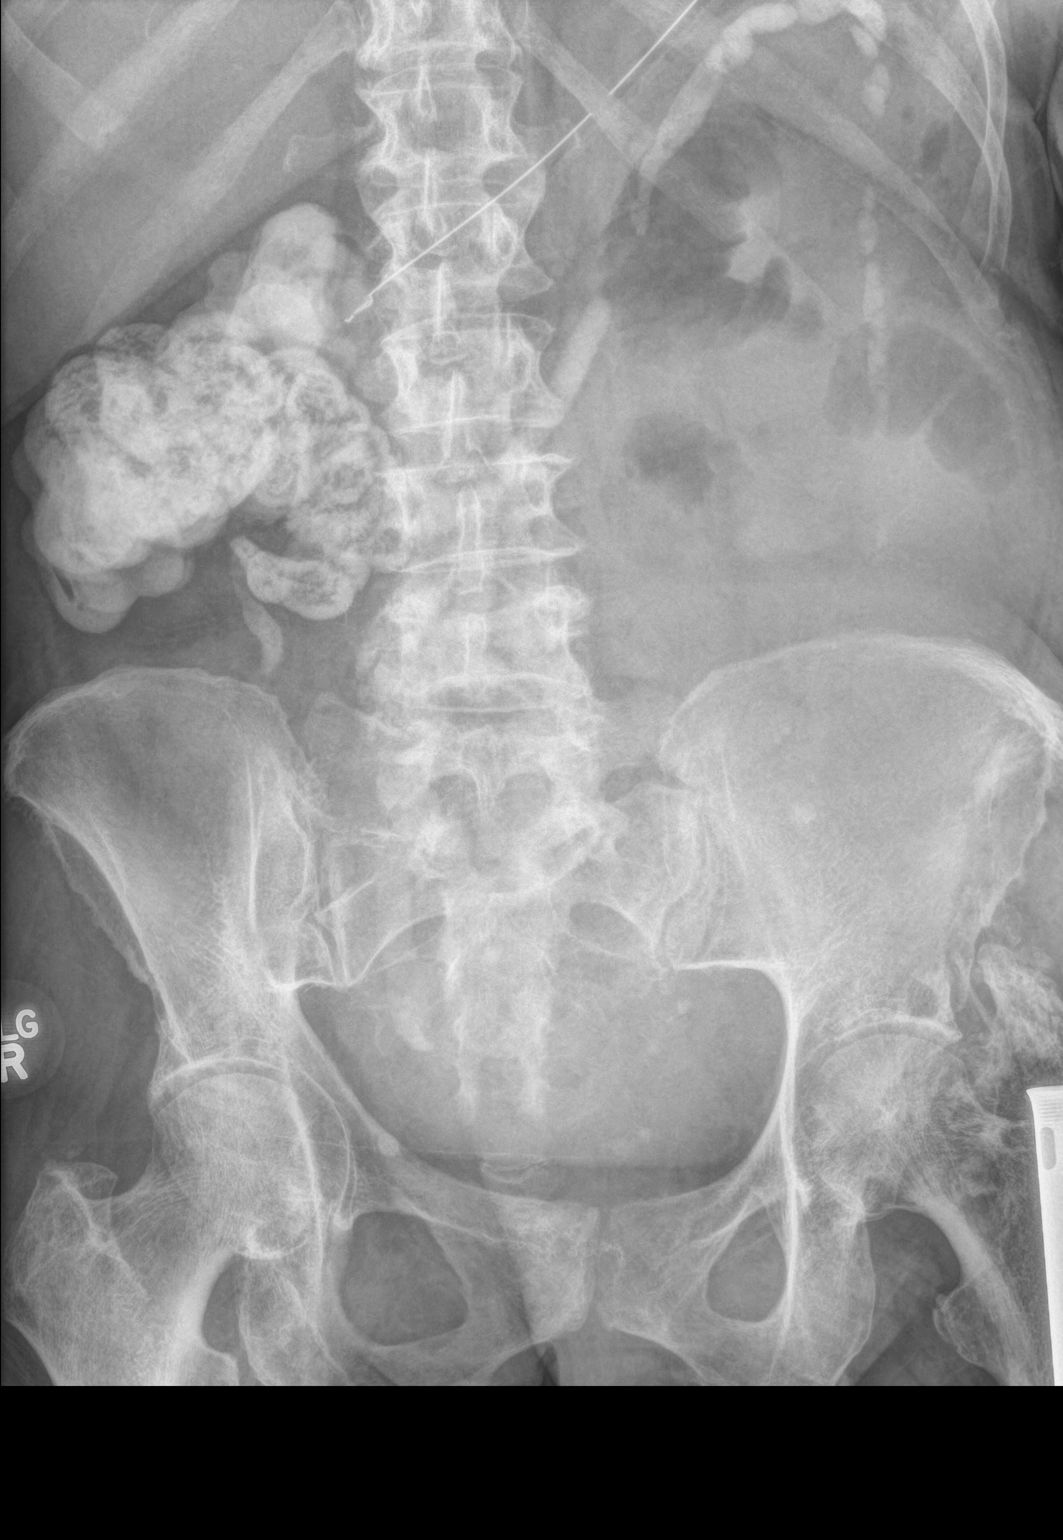

[abdomen kub (2 of 2)]
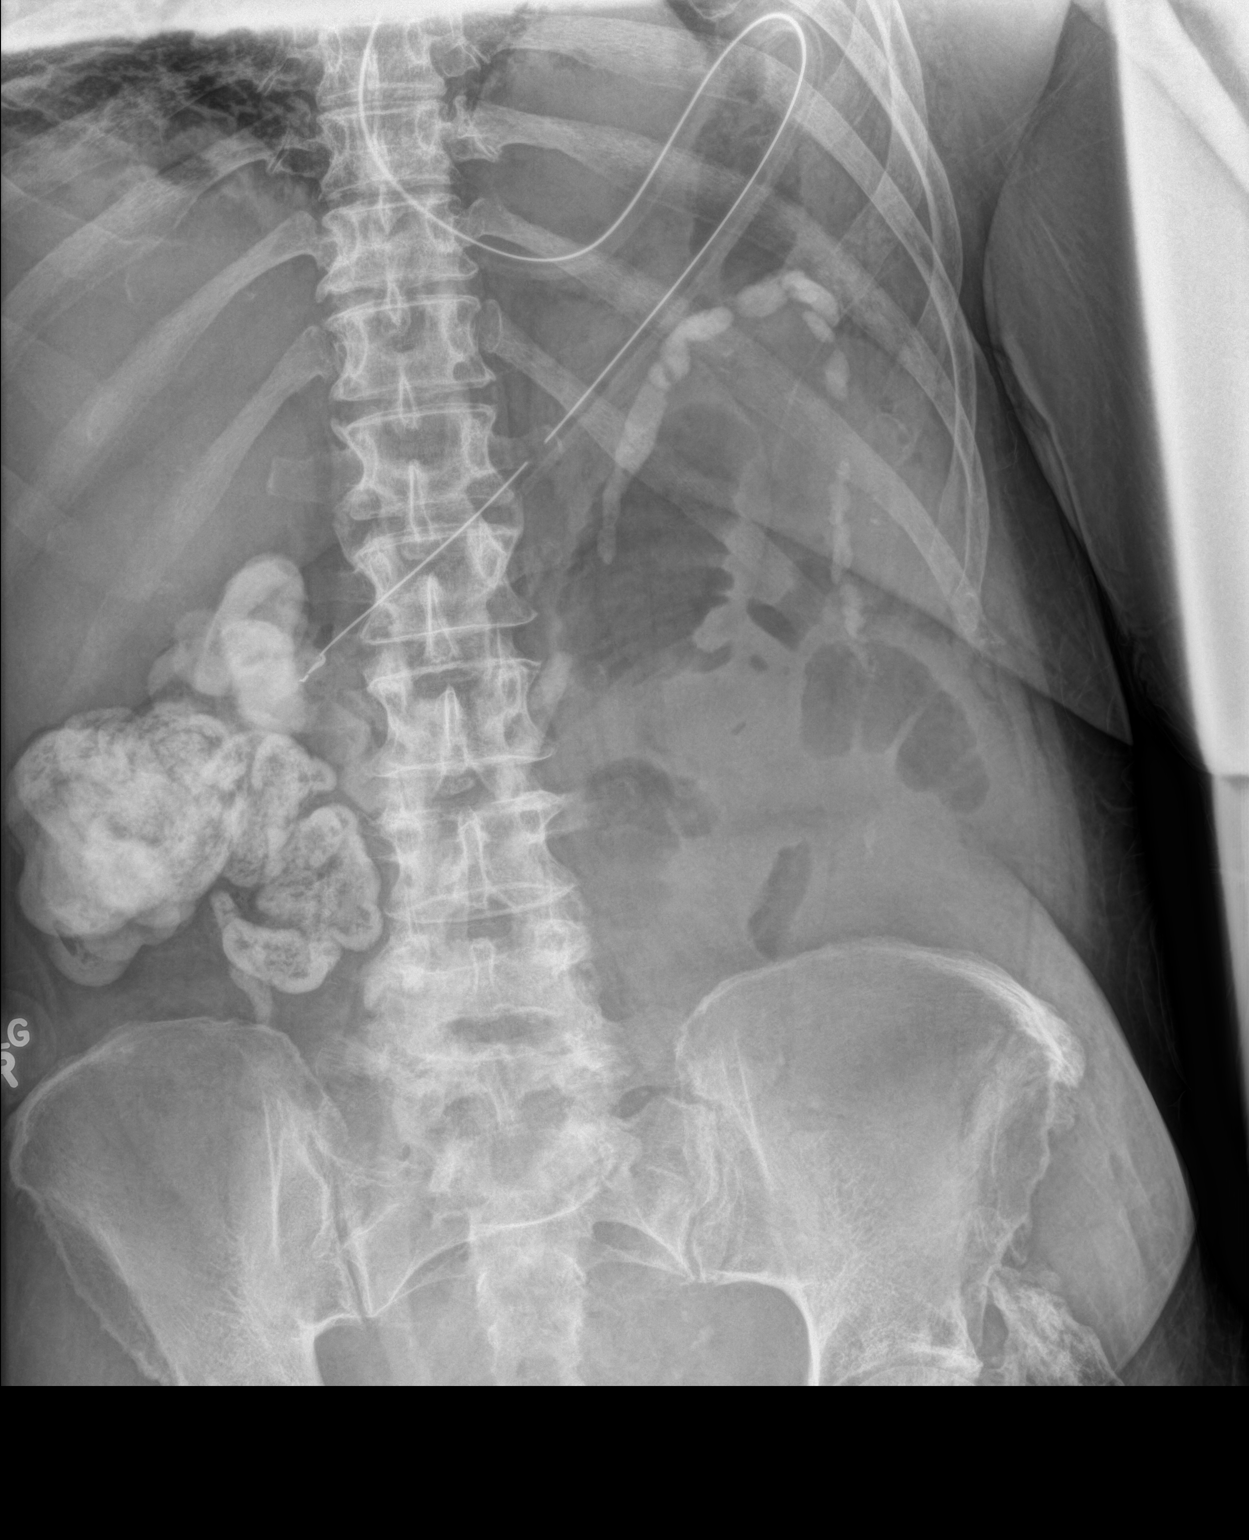

[2 of 2 positions shown; findings below may reference images not displayed]

FINDINGS: Oral contrast material demonstrated within the ascending colon. NG
tube tip and side-port project over the stomach which has been
decompressed in the interval. Residual gaseous distended loops of
small bowel within the left upper quadrant. Lower lumbar spine
degenerative change. Surgical hardware proximal left femur.
Nonspecific linear radiodensity projecting over the left upper
quadrant may potentially represent contrast within nondilated bowel
or be external to the patient.
IMPRESSION: Interval decompression of the stomach with NG tube.

Persistent gaseous distention of multiple loops of small bowel.

## 2016-07-19 IMAGING — DX DG ABD PORTABLE 1V
1 series · 1 of 1 positions shown · non-contrast
Comparison: 07/31/2014

CLINICAL DATA: Small bowel obstruction, abdominal distension

EXAM:
PORTABLE ABDOMEN - 1 VIEW

[abdomen kub]
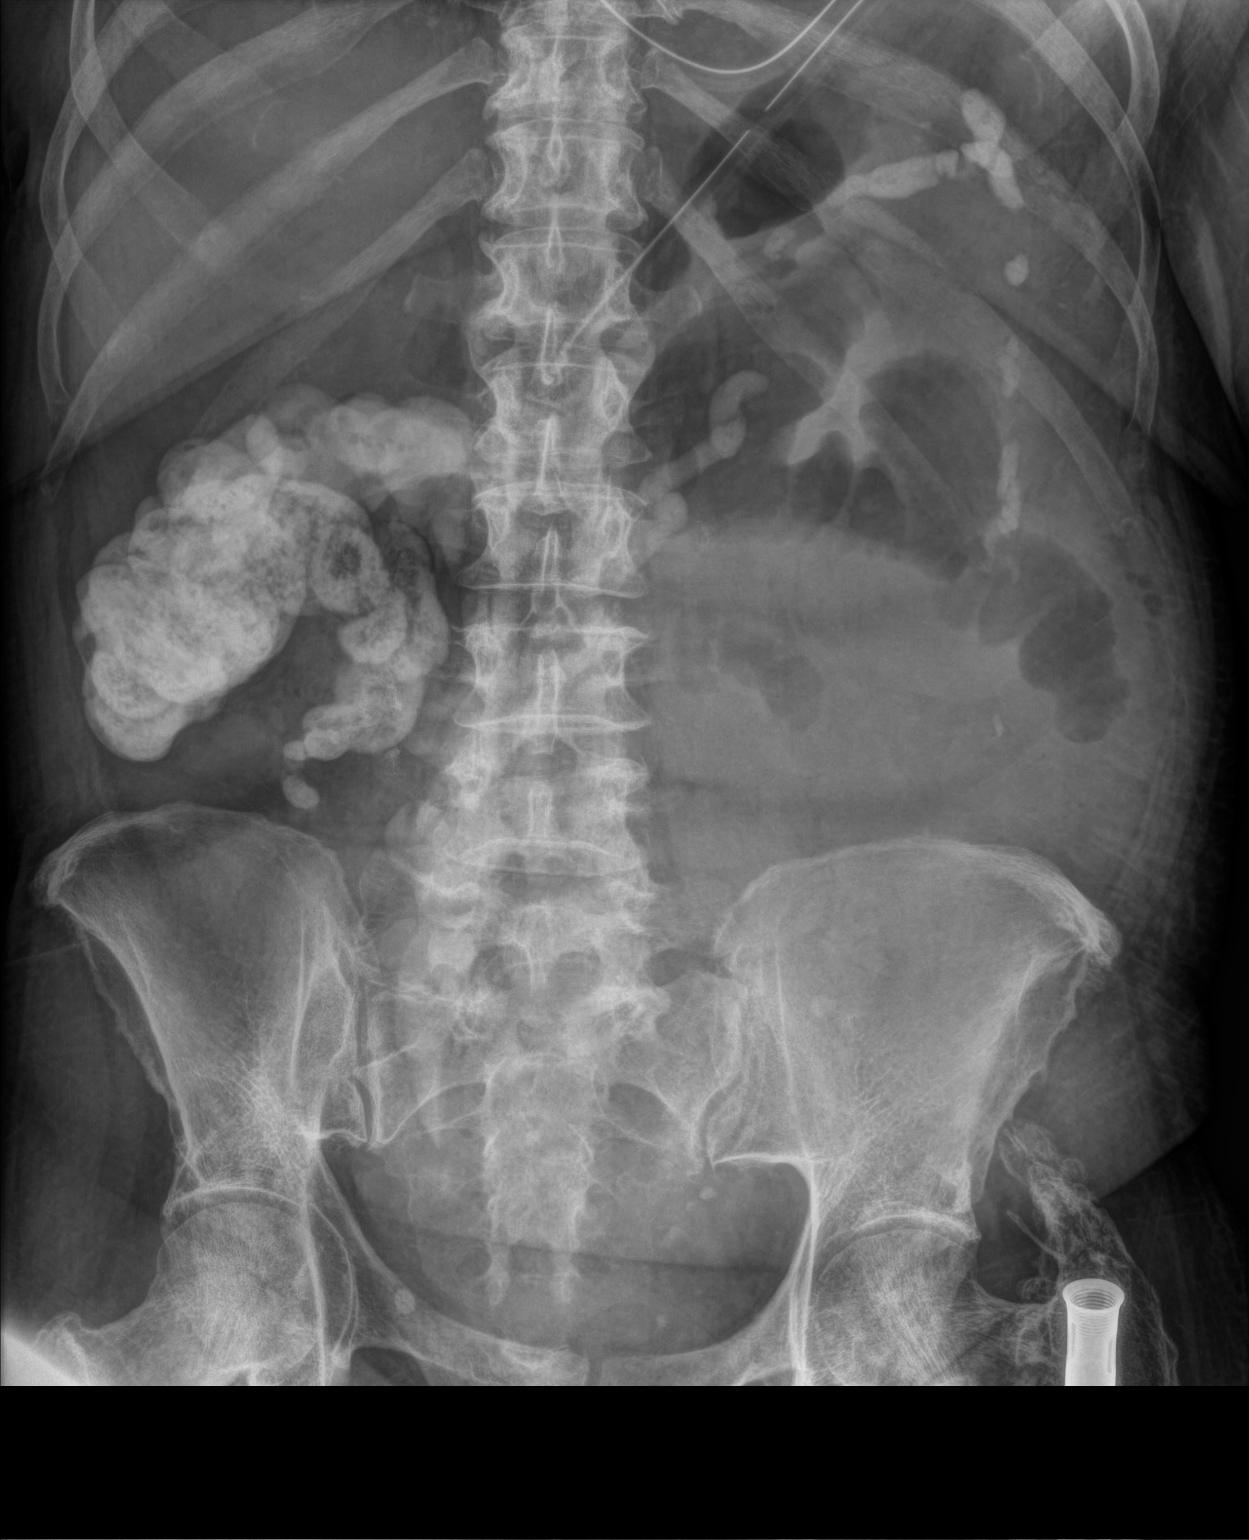

[1 of 1 positions shown; findings below may reference images not displayed]

FINDINGS: Persistent dilatation of small bowel loops in the mid and LEFT
abdomen.

No definite bowel wall thickening.

Tip of nasogastric tube projects over gastric antrum.

Bones demineralized with evidence of prior LEFT femoral nailing.
IMPRESSION: Persistent small bowel dilatation consistent with small bowel
obstruction.

## 2021-07-15 ENCOUNTER — Encounter: Payer: Self-pay | Admitting: *Deleted

## 2021-07-15 NOTE — Progress Notes (Signed)
Received in mail request for medical records from [facility] on patient. Forwarded request to [email or web address] and copy to be scanned.
